# Patient Record
Sex: Female | Born: 1940
Health system: Southern US, Community
[De-identification: ages and names within clinical notes are randomized; demographics above are authoritative.]

## PROBLEM LIST (undated history)

## (undated) DIAGNOSIS — D689 Coagulation defect, unspecified: Secondary | ICD-10-CM

## (undated) DIAGNOSIS — M545 Low back pain, unspecified: Secondary | ICD-10-CM

## (undated) DIAGNOSIS — R011 Cardiac murmur, unspecified: Secondary | ICD-10-CM

## (undated) DIAGNOSIS — R569 Unspecified convulsions: Secondary | ICD-10-CM

## (undated) DIAGNOSIS — I2510 Atherosclerotic heart disease of native coronary artery without angina pectoris: Secondary | ICD-10-CM

## (undated) DIAGNOSIS — M1990 Unspecified osteoarthritis, unspecified site: Secondary | ICD-10-CM

## (undated) DIAGNOSIS — G8929 Other chronic pain: Secondary | ICD-10-CM

## (undated) DIAGNOSIS — M1711 Unilateral primary osteoarthritis, right knee: Secondary | ICD-10-CM

## (undated) DIAGNOSIS — Z5189 Encounter for other specified aftercare: Secondary | ICD-10-CM

## (undated) DIAGNOSIS — T8859XA Other complications of anesthesia, initial encounter: Secondary | ICD-10-CM

## (undated) DIAGNOSIS — Z9989 Dependence on other enabling machines and devices: Secondary | ICD-10-CM

## (undated) DIAGNOSIS — I83893 Varicose veins of bilateral lower extremities with other complications: Secondary | ICD-10-CM

## (undated) DIAGNOSIS — T148XXA Other injury of unspecified body region, initial encounter: Secondary | ICD-10-CM

## (undated) DIAGNOSIS — K219 Gastro-esophageal reflux disease without esophagitis: Secondary | ICD-10-CM

## (undated) DIAGNOSIS — M1712 Unilateral primary osteoarthritis, left knee: Secondary | ICD-10-CM

## (undated) DIAGNOSIS — R0609 Other forms of dyspnea: Secondary | ICD-10-CM

## (undated) DIAGNOSIS — T4145XA Adverse effect of unspecified anesthetic, initial encounter: Secondary | ICD-10-CM

## (undated) DIAGNOSIS — R0600 Dyspnea, unspecified: Secondary | ICD-10-CM

## (undated) DIAGNOSIS — Z86718 Personal history of other venous thrombosis and embolism: Secondary | ICD-10-CM

## (undated) DIAGNOSIS — M797 Fibromyalgia: Secondary | ICD-10-CM

## (undated) DIAGNOSIS — C50919 Malignant neoplasm of unspecified site of unspecified female breast: Secondary | ICD-10-CM

## (undated) DIAGNOSIS — J302 Other seasonal allergic rhinitis: Secondary | ICD-10-CM

## (undated) DIAGNOSIS — F419 Anxiety disorder, unspecified: Secondary | ICD-10-CM

## (undated) DIAGNOSIS — I2699 Other pulmonary embolism without acute cor pulmonale: Secondary | ICD-10-CM

## (undated) DIAGNOSIS — I890 Lymphedema, not elsewhere classified: Secondary | ICD-10-CM

## (undated) DIAGNOSIS — M109 Gout, unspecified: Secondary | ICD-10-CM

## (undated) DIAGNOSIS — I10 Essential (primary) hypertension: Secondary | ICD-10-CM

## (undated) DIAGNOSIS — G4733 Obstructive sleep apnea (adult) (pediatric): Secondary | ICD-10-CM

## (undated) DIAGNOSIS — M51379 Other intervertebral disc degeneration, lumbosacral region without mention of lumbar back pain or lower extremity pain: Secondary | ICD-10-CM

## (undated) DIAGNOSIS — M5137 Other intervertebral disc degeneration, lumbosacral region: Secondary | ICD-10-CM

## (undated) DIAGNOSIS — R609 Edema, unspecified: Secondary | ICD-10-CM

## (undated) DIAGNOSIS — Z8719 Personal history of other diseases of the digestive system: Secondary | ICD-10-CM

## (undated) HISTORY — DX: Gastro-esophageal reflux disease without esophagitis: K21.9

## (undated) HISTORY — DX: Atherosclerotic heart disease of native coronary artery without angina pectoris: I25.10

## (undated) HISTORY — DX: Edema, unspecified: R60.9

## (undated) HISTORY — PX: KNEE ARTHROSCOPY: SHX127

## (undated) HISTORY — PX: TUBAL LIGATION: SHX77

## (undated) HISTORY — DX: Unilateral primary osteoarthritis, right knee: M17.11

## (undated) HISTORY — PX: PORTACATH PLACEMENT: SHX2246

## (undated) HISTORY — DX: Encounter for other specified aftercare: Z51.89

## (undated) HISTORY — DX: Coagulation defect, unspecified: D68.9

## (undated) HISTORY — PX: CATARACT EXTRACTION, BILATERAL: SHX1313

## (undated) HISTORY — PX: BREAST SURGERY: SHX581

## (undated) HISTORY — PX: VAGINAL HYSTERECTOMY: SUR661

## (undated) HISTORY — DX: Unilateral primary osteoarthritis, left knee: M17.12

---
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
## 2001-05-10 ENCOUNTER — Encounter: Payer: Self-pay | Admitting: General Surgery

## 2001-05-10 ENCOUNTER — Encounter: Admission: RE | Admit: 2001-05-10 | Discharge: 2001-05-10 | Payer: Self-pay | Admitting: General Surgery

## 2007-05-11 ENCOUNTER — Encounter: Admission: RE | Admit: 2007-05-11 | Discharge: 2007-05-11 | Payer: Self-pay | Admitting: General Surgery

## 2008-02-18 HISTORY — PX: BREAST LUMPECTOMY: SHX2

## 2008-03-05 ENCOUNTER — Encounter: Admission: RE | Admit: 2008-03-05 | Discharge: 2008-03-05 | Payer: Self-pay | Admitting: Internal Medicine

## 2008-03-05 HISTORY — PX: BREAST BIOPSY: SHX20

## 2008-03-12 ENCOUNTER — Encounter: Admission: RE | Admit: 2008-03-12 | Discharge: 2008-03-12 | Payer: Self-pay | Admitting: General Surgery

## 2008-03-15 ENCOUNTER — Ambulatory Visit (HOSPITAL_BASED_OUTPATIENT_CLINIC_OR_DEPARTMENT_OTHER): Admission: RE | Admit: 2008-03-15 | Discharge: 2008-03-16 | Payer: Self-pay | Admitting: General Surgery

## 2008-03-15 ENCOUNTER — Encounter (HOSPITAL_BASED_OUTPATIENT_CLINIC_OR_DEPARTMENT_OTHER): Payer: Self-pay | Admitting: General Surgery

## 2008-03-28 ENCOUNTER — Ambulatory Visit (HOSPITAL_COMMUNITY): Admission: RE | Admit: 2008-03-28 | Discharge: 2008-03-29 | Payer: Self-pay | Admitting: General Surgery

## 2008-03-28 ENCOUNTER — Encounter (HOSPITAL_BASED_OUTPATIENT_CLINIC_OR_DEPARTMENT_OTHER): Payer: Self-pay | Admitting: General Surgery

## 2008-03-28 HISTORY — PX: MASTECTOMY MODIFIED RADICAL / SIMPLE / COMPLETE: SUR849

## 2008-04-06 ENCOUNTER — Ambulatory Visit: Payer: Self-pay | Admitting: Oncology

## 2008-04-18 LAB — CBC WITH DIFFERENTIAL/PLATELET
Basophils Absolute: 0.1 10*3/uL (ref 0.0–0.1)
HCT: 38.1 % (ref 34.8–46.6)
HGB: 13.1 g/dL (ref 11.6–15.9)
MCH: 33.4 pg (ref 26.0–34.0)
MONO#: 0.7 10*3/uL (ref 0.1–0.9)
NEUT%: 63 % (ref 39.6–76.8)
WBC: 6.9 10*3/uL (ref 3.9–10.0)
lymph#: 1.5 10*3/uL (ref 0.9–3.3)

## 2008-04-19 LAB — COMPREHENSIVE METABOLIC PANEL
BUN: 22 mg/dL (ref 6–23)
CO2: 24 mEq/L (ref 19–32)
Calcium: 9.6 mg/dL (ref 8.4–10.5)
Chloride: 103 mEq/L (ref 96–112)
Creatinine, Ser: 0.93 mg/dL (ref 0.40–1.20)
Glucose, Bld: 93 mg/dL (ref 70–99)

## 2008-04-19 LAB — VITAMIN D 25 HYDROXY (VIT D DEFICIENCY, FRACTURES): Vit D, 25-Hydroxy: 17 ng/mL — ABNORMAL LOW (ref 30–89)

## 2008-04-19 LAB — LACTATE DEHYDROGENASE: LDH: 129 U/L (ref 94–250)

## 2008-04-24 ENCOUNTER — Encounter (HOSPITAL_COMMUNITY): Admission: RE | Admit: 2008-04-24 | Discharge: 2008-07-19 | Payer: Self-pay | Admitting: Oncology

## 2008-04-30 ENCOUNTER — Encounter: Payer: Self-pay | Admitting: Oncology

## 2008-04-30 ENCOUNTER — Ambulatory Visit: Payer: Self-pay

## 2008-05-11 ENCOUNTER — Ambulatory Visit (HOSPITAL_COMMUNITY): Admission: RE | Admit: 2008-05-11 | Discharge: 2008-05-11 | Payer: Self-pay | Admitting: Oncology

## 2008-05-17 LAB — COMPREHENSIVE METABOLIC PANEL
ALT: 37 U/L — ABNORMAL HIGH (ref 0–35)
AST: 27 U/L (ref 0–37)
Chloride: 97 mEq/L (ref 96–112)
Creatinine, Ser: 1.11 mg/dL (ref 0.40–1.20)
Sodium: 136 mEq/L (ref 135–145)
Total Bilirubin: 0.8 mg/dL (ref 0.3–1.2)
Total Protein: 6.6 g/dL (ref 6.0–8.3)

## 2008-05-17 LAB — CBC WITH DIFFERENTIAL/PLATELET
BASO%: 2.9 % — ABNORMAL HIGH (ref 0.0–2.0)
EOS%: 1.9 % (ref 0.0–7.0)
HCT: 42.3 % (ref 34.8–46.6)
LYMPH%: 22.7 % (ref 14.0–48.0)
MCH: 32.2 pg (ref 26.0–34.0)
MCHC: 35.1 g/dL (ref 32.0–36.0)
MONO#: 1.1 10*3/uL — ABNORMAL HIGH (ref 0.1–0.9)
NEUT%: 56.6 % (ref 39.6–76.8)
RBC: 4.61 10*6/uL (ref 3.70–5.32)
WBC: 6.6 10*3/uL (ref 3.9–10.0)
lymph#: 1.5 10*3/uL (ref 0.9–3.3)

## 2008-05-21 ENCOUNTER — Ambulatory Visit (HOSPITAL_COMMUNITY): Admission: RE | Admit: 2008-05-21 | Discharge: 2008-05-21 | Payer: Self-pay | Admitting: General Surgery

## 2008-05-22 ENCOUNTER — Ambulatory Visit: Payer: Self-pay | Admitting: Oncology

## 2008-05-24 LAB — CBC WITH DIFFERENTIAL/PLATELET
BASO%: 0.7 % (ref 0.0–2.0)
EOS%: 2 % (ref 0.0–7.0)
HCT: 36.7 % (ref 34.8–46.6)
LYMPH%: 16.6 % (ref 14.0–48.0)
MCH: 32.5 pg (ref 26.0–34.0)
MCHC: 34.8 g/dL (ref 32.0–36.0)
MONO#: 0.6 10*3/uL (ref 0.1–0.9)
NEUT%: 74.4 % (ref 39.6–76.8)
Platelets: 168 10*3/uL (ref 145–400)
RBC: 3.94 10*6/uL (ref 3.70–5.32)
WBC: 8.7 10*3/uL (ref 3.9–10.0)
lymph#: 1.4 10*3/uL (ref 0.9–3.3)

## 2008-05-24 LAB — COMPREHENSIVE METABOLIC PANEL
ALT: 31 U/L (ref 0–35)
AST: 29 U/L (ref 0–37)
CO2: 26 mEq/L (ref 19–32)
Creatinine, Ser: 0.77 mg/dL (ref 0.40–1.20)
Sodium: 140 mEq/L (ref 135–145)
Total Bilirubin: 0.9 mg/dL (ref 0.3–1.2)
Total Protein: 5.7 g/dL — ABNORMAL LOW (ref 6.0–8.3)

## 2008-05-31 LAB — CBC WITH DIFFERENTIAL/PLATELET
BASO%: 0.1 % (ref 0.0–2.0)
EOS%: 0.1 % (ref 0.0–7.0)
HCT: 34.4 % — ABNORMAL LOW (ref 34.8–46.6)
LYMPH%: 4.2 % — ABNORMAL LOW (ref 14.0–48.0)
MCH: 32.8 pg (ref 26.0–34.0)
MCHC: 35.4 g/dL (ref 32.0–36.0)
MCV: 92.5 fL (ref 81.0–101.0)
MONO#: 0.3 10*3/uL (ref 0.1–0.9)
MONO%: 1.8 % (ref 0.0–13.0)
NEUT%: 93.7 % — ABNORMAL HIGH (ref 39.6–76.8)
Platelets: 402 10*3/uL — ABNORMAL HIGH (ref 145–400)
RBC: 3.72 10*6/uL (ref 3.70–5.32)

## 2008-05-31 LAB — COMPREHENSIVE METABOLIC PANEL
Alkaline Phosphatase: 71 U/L (ref 39–117)
Creatinine, Ser: 0.85 mg/dL (ref 0.40–1.20)
Glucose, Bld: 127 mg/dL — ABNORMAL HIGH (ref 70–99)
Sodium: 138 mEq/L (ref 135–145)
Total Bilirubin: 0.2 mg/dL — ABNORMAL LOW (ref 0.3–1.2)
Total Protein: 6.9 g/dL (ref 6.0–8.3)

## 2008-06-07 LAB — CBC WITH DIFFERENTIAL/PLATELET
Basophils Absolute: 0.2 10*3/uL — ABNORMAL HIGH (ref 0.0–0.1)
Eosinophils Absolute: 0.1 10*3/uL (ref 0.0–0.5)
HCT: 38 % (ref 34.8–46.6)
HGB: 13.3 g/dL (ref 11.6–15.9)
LYMPH%: 18 % (ref 14.0–48.0)
MCV: 94.5 fL (ref 81.0–101.0)
MONO#: 1.3 10*3/uL — ABNORMAL HIGH (ref 0.1–0.9)
MONO%: 14.8 % — ABNORMAL HIGH (ref 0.0–13.0)
NEUT#: 5.8 10*3/uL (ref 1.5–6.5)
NEUT%: 64.2 % (ref 39.6–76.8)
Platelets: 200 10*3/uL (ref 145–400)
RBC: 4.02 10*6/uL (ref 3.70–5.32)
WBC: 9 10*3/uL (ref 3.9–10.0)

## 2008-06-15 LAB — CBC WITH DIFFERENTIAL/PLATELET
BASO%: 1.1 % (ref 0.0–2.0)
HCT: 33.3 % — ABNORMAL LOW (ref 34.8–46.6)
LYMPH%: 17.8 % (ref 14.0–48.0)
MCH: 33.1 pg (ref 26.0–34.0)
MCHC: 35.1 g/dL (ref 32.0–36.0)
MCV: 94.2 fL (ref 81.0–101.0)
MONO#: 0.8 10*3/uL (ref 0.1–0.9)
MONO%: 10.2 % (ref 0.0–13.0)
NEUT%: 70 % (ref 39.6–76.8)
Platelets: 114 10*3/uL — ABNORMAL LOW (ref 145–400)
RBC: 3.54 10*6/uL — ABNORMAL LOW (ref 3.70–5.32)

## 2008-06-21 LAB — CBC WITH DIFFERENTIAL/PLATELET
Basophils Absolute: 0 10*3/uL (ref 0.0–0.1)
Eosinophils Absolute: 0 10*3/uL (ref 0.0–0.5)
HGB: 11.1 g/dL — ABNORMAL LOW (ref 11.6–15.9)
MCV: 94.6 fL (ref 81.0–101.0)
MONO#: 0.5 10*3/uL (ref 0.1–0.9)
MONO%: 3.4 % (ref 0.0–13.0)
NEUT#: 13.1 10*3/uL — ABNORMAL HIGH (ref 1.5–6.5)
RBC: 3.28 10*6/uL — ABNORMAL LOW (ref 3.70–5.32)
RDW: 16.5 % — ABNORMAL HIGH (ref 11.3–14.5)
WBC: 14.4 10*3/uL — ABNORMAL HIGH (ref 3.9–10.0)
lymph#: 0.7 10*3/uL — ABNORMAL LOW (ref 0.9–3.3)

## 2008-06-21 LAB — COMPREHENSIVE METABOLIC PANEL
Albumin: 3.9 g/dL (ref 3.5–5.2)
Alkaline Phosphatase: 61 U/L (ref 39–117)
BUN: 26 mg/dL — ABNORMAL HIGH (ref 6–23)
CO2: 20 mEq/L (ref 19–32)
Glucose, Bld: 138 mg/dL — ABNORMAL HIGH (ref 70–99)
Potassium: 3.4 mEq/L — ABNORMAL LOW (ref 3.5–5.3)
Total Protein: 6.3 g/dL (ref 6.0–8.3)

## 2008-06-28 LAB — COMPREHENSIVE METABOLIC PANEL
Albumin: 4 g/dL (ref 3.5–5.2)
Alkaline Phosphatase: 80 U/L (ref 39–117)
BUN: 20 mg/dL (ref 6–23)
CO2: 19 mEq/L (ref 19–32)
Calcium: 8.5 mg/dL (ref 8.4–10.5)
Chloride: 105 mEq/L (ref 96–112)
Glucose, Bld: 135 mg/dL — ABNORMAL HIGH (ref 70–99)
Potassium: 3.9 mEq/L (ref 3.5–5.3)
Sodium: 139 mEq/L (ref 135–145)
Total Protein: 6.5 g/dL (ref 6.0–8.3)

## 2008-06-28 LAB — CBC WITH DIFFERENTIAL/PLATELET
Basophils Absolute: 0.1 10*3/uL (ref 0.0–0.1)
Eosinophils Absolute: 0 10*3/uL (ref 0.0–0.5)
HGB: 12.5 g/dL (ref 11.6–15.9)
MCV: 96.5 fL (ref 81.0–101.0)
MONO%: 27.2 % — ABNORMAL HIGH (ref 0.0–13.0)
NEUT#: 2.8 10*3/uL (ref 1.5–6.5)
RBC: 3.66 10*6/uL — ABNORMAL LOW (ref 3.70–5.32)
RDW: 16.3 % — ABNORMAL HIGH (ref 11.3–14.5)
WBC: 5.7 10*3/uL (ref 3.9–10.0)
lymph#: 1.2 10*3/uL (ref 0.9–3.3)

## 2008-07-05 LAB — CBC WITH DIFFERENTIAL/PLATELET
Basophils Absolute: 0.1 10*3/uL (ref 0.0–0.1)
Eosinophils Absolute: 0.1 10*3/uL (ref 0.0–0.5)
HGB: 10 g/dL — ABNORMAL LOW (ref 11.6–15.9)
MONO#: 0.6 10*3/uL (ref 0.1–0.9)
NEUT#: 5.1 10*3/uL (ref 1.5–6.5)
Platelets: 84 10*3/uL — ABNORMAL LOW (ref 145–400)
RBC: 2.94 10*6/uL — ABNORMAL LOW (ref 3.70–5.32)
RDW: 16.5 % — ABNORMAL HIGH (ref 11.3–14.5)
WBC: 7.1 10*3/uL (ref 3.9–10.0)

## 2008-07-09 ENCOUNTER — Ambulatory Visit: Payer: Self-pay | Admitting: Oncology

## 2008-07-11 LAB — CBC WITH DIFFERENTIAL/PLATELET
BASO%: 1.3 % (ref 0.0–2.0)
EOS%: 5.2 % (ref 0.0–7.0)
LYMPH%: 22.4 % (ref 14.0–48.0)
MCH: 34.5 pg — ABNORMAL HIGH (ref 26.0–34.0)
MCHC: 34.3 g/dL (ref 32.0–36.0)
MCV: 101 fL (ref 81.0–101.0)
MONO#: 0.7 10*3/uL (ref 0.1–0.9)
MONO%: 12.8 % (ref 0.0–13.0)
Platelets: 212 10*3/uL (ref 145–400)
RBC: 2.87 10*6/uL — ABNORMAL LOW (ref 3.70–5.32)
WBC: 5.2 10*3/uL (ref 3.9–10.0)

## 2008-07-19 LAB — COMPREHENSIVE METABOLIC PANEL
ALT: 31 U/L (ref 0–35)
AST: 44 U/L — ABNORMAL HIGH (ref 0–37)
Alkaline Phosphatase: 58 U/L (ref 39–117)
Creatinine, Ser: 1 mg/dL (ref 0.40–1.20)
Sodium: 136 mEq/L (ref 135–145)
Total Bilirubin: 0.3 mg/dL (ref 0.3–1.2)
Total Protein: 6.3 g/dL (ref 6.0–8.3)

## 2008-07-19 LAB — CBC WITH DIFFERENTIAL/PLATELET
BASO%: 0.1 % (ref 0.0–2.0)
HCT: 30 % — ABNORMAL LOW (ref 34.8–46.6)
LYMPH%: 4.2 % — ABNORMAL LOW (ref 14.0–48.0)
MCHC: 33.8 g/dL (ref 32.0–36.0)
MCV: 106.3 fL — ABNORMAL HIGH (ref 81.0–101.0)
MONO%: 0.9 % (ref 0.0–13.0)
NEUT%: 94.8 % — ABNORMAL HIGH (ref 39.6–76.8)
Platelets: 387 10*3/uL (ref 145–400)
RBC: 2.82 10*6/uL — ABNORMAL LOW (ref 3.70–5.32)

## 2008-07-20 DIAGNOSIS — IMO0001 Reserved for inherently not codable concepts without codable children: Secondary | ICD-10-CM

## 2008-07-20 DIAGNOSIS — Z5189 Encounter for other specified aftercare: Secondary | ICD-10-CM

## 2008-07-20 HISTORY — DX: Reserved for inherently not codable concepts without codable children: IMO0001

## 2008-07-20 HISTORY — DX: Encounter for other specified aftercare: Z51.89

## 2008-07-26 LAB — CBC WITH DIFFERENTIAL/PLATELET
Basophils Absolute: 0 10*3/uL (ref 0.0–0.1)
Eosinophils Absolute: 0.1 10*3/uL (ref 0.0–0.5)
HGB: 11.8 g/dL (ref 11.6–15.9)
MCV: 105.3 fL — ABNORMAL HIGH (ref 81.0–101.0)
MONO#: 1.7 10*3/uL — ABNORMAL HIGH (ref 0.1–0.9)
NEUT#: 2.8 10*3/uL (ref 1.5–6.5)
RDW: 20.8 % — ABNORMAL HIGH (ref 11.3–14.5)
WBC: 5.9 10*3/uL (ref 3.9–10.0)
lymph#: 1.4 10*3/uL (ref 0.9–3.3)

## 2008-08-02 LAB — BASIC METABOLIC PANEL
BUN: 18 mg/dL (ref 6–23)
Chloride: 100 mEq/L (ref 96–112)
Creatinine, Ser: 0.92 mg/dL (ref 0.40–1.20)

## 2008-08-02 LAB — CBC WITH DIFFERENTIAL/PLATELET
Basophils Absolute: 0.1 10*3/uL (ref 0.0–0.1)
EOS%: 0.5 % (ref 0.0–7.0)
HCT: 28.5 % — ABNORMAL LOW (ref 34.8–46.6)
HGB: 9.7 g/dL — ABNORMAL LOW (ref 11.6–15.9)
MCH: 36.8 pg — ABNORMAL HIGH (ref 26.0–34.0)
MCV: 107.8 fL — ABNORMAL HIGH (ref 81.0–101.0)
MONO%: 8.3 % (ref 0.0–13.0)
NEUT%: 74.8 % (ref 39.6–76.8)
Platelets: 116 10*3/uL — ABNORMAL LOW (ref 145–400)

## 2008-08-09 LAB — COMPREHENSIVE METABOLIC PANEL
ALT: 19 U/L (ref 0–35)
AST: 19 U/L (ref 0–37)
Albumin: 4 g/dL (ref 3.5–5.2)
Alkaline Phosphatase: 70 U/L (ref 39–117)
Glucose, Bld: 175 mg/dL — ABNORMAL HIGH (ref 70–99)
Potassium: 4.6 mEq/L (ref 3.5–5.3)
Sodium: 139 mEq/L (ref 135–145)
Total Protein: 6.5 g/dL (ref 6.0–8.3)

## 2008-08-09 LAB — CBC WITH DIFFERENTIAL/PLATELET
BASO%: 0.5 % (ref 0.0–2.0)
EOS%: 0 % (ref 0.0–7.0)
Eosinophils Absolute: 0 10*3/uL (ref 0.0–0.5)
MCV: 110.8 fL — ABNORMAL HIGH (ref 81.0–101.0)
MONO%: 1.5 % (ref 0.0–13.0)
NEUT#: 13.2 10*3/uL — ABNORMAL HIGH (ref 1.5–6.5)
RBC: 2.77 10*6/uL — ABNORMAL LOW (ref 3.70–5.32)
RDW: 21.4 % — ABNORMAL HIGH (ref 11.3–14.5)

## 2008-08-16 LAB — BASIC METABOLIC PANEL
Calcium: 8.7 mg/dL (ref 8.4–10.5)
Potassium: 3.7 mEq/L (ref 3.5–5.3)
Sodium: 132 mEq/L — ABNORMAL LOW (ref 135–145)

## 2008-08-16 LAB — CBC WITH DIFFERENTIAL/PLATELET
EOS%: 0.4 % (ref 0.0–7.0)
LYMPH%: 20.6 % (ref 14.0–48.0)
MCH: 38.4 pg — ABNORMAL HIGH (ref 26.0–34.0)
MCHC: 34.5 g/dL (ref 32.0–36.0)
MCV: 111.4 fL — ABNORMAL HIGH (ref 81.0–101.0)
MONO%: 11.5 % (ref 0.0–13.0)
RBC: 2.99 10*6/uL — ABNORMAL LOW (ref 3.70–5.32)
RDW: 17.5 % — ABNORMAL HIGH (ref 11.3–14.5)

## 2008-08-21 ENCOUNTER — Ambulatory Visit: Payer: Self-pay | Admitting: Oncology

## 2008-08-23 LAB — CBC WITH DIFFERENTIAL/PLATELET
BASO%: 0.7 % (ref 0.0–2.0)
LYMPH%: 17.7 % (ref 14.0–48.0)
MCHC: 34.9 g/dL (ref 32.0–36.0)
MCV: 112.7 fL — ABNORMAL HIGH (ref 81.0–101.0)
MONO%: 5.2 % (ref 0.0–13.0)
Platelets: 61 10*3/uL — ABNORMAL LOW (ref 145–400)
RBC: 2.56 10*6/uL — ABNORMAL LOW (ref 3.70–5.32)

## 2008-08-29 ENCOUNTER — Ambulatory Visit: Admission: RE | Admit: 2008-08-29 | Discharge: 2008-08-29 | Payer: Self-pay | Admitting: Oncology

## 2008-08-29 ENCOUNTER — Ambulatory Visit: Payer: Self-pay | Admitting: Cardiology

## 2008-08-29 ENCOUNTER — Encounter: Payer: Self-pay | Admitting: Oncology

## 2008-08-30 ENCOUNTER — Encounter (HOSPITAL_COMMUNITY): Admission: RE | Admit: 2008-08-30 | Discharge: 2008-11-22 | Payer: Self-pay | Admitting: Oncology

## 2008-09-06 LAB — CBC WITH DIFFERENTIAL/PLATELET
BASO%: 0.2 % (ref 0.0–2.0)
EOS%: 0 % (ref 0.0–7.0)
LYMPH%: 5.1 % — ABNORMAL LOW (ref 14.0–48.0)
MCHC: 34.3 g/dL (ref 32.0–36.0)
MONO#: 0.1 10*3/uL (ref 0.1–0.9)
Platelets: 159 10*3/uL (ref 145–400)
RBC: 3.17 10*6/uL — ABNORMAL LOW (ref 3.70–5.32)
WBC: 7.6 10*3/uL (ref 3.9–10.0)
lymph#: 0.4 10*3/uL — ABNORMAL LOW (ref 0.9–3.3)

## 2008-09-13 LAB — CBC WITH DIFFERENTIAL/PLATELET
BASO%: 0.5 % (ref 0.0–2.0)
HCT: 37.5 % (ref 34.8–46.6)
MCHC: 35.1 g/dL (ref 31.5–36.0)
MONO#: 0.5 10*3/uL (ref 0.1–0.9)
NEUT%: 62.3 % (ref 38.4–76.8)
RDW: 19 % — ABNORMAL HIGH (ref 11.2–14.5)
WBC: 3.8 10*3/uL — ABNORMAL LOW (ref 3.9–10.3)
lymph#: 0.9 10*3/uL (ref 0.9–3.3)
nRBC: 0 % (ref 0–0)

## 2008-10-04 LAB — CBC WITH DIFFERENTIAL/PLATELET
Basophils Absolute: 0.1 10*3/uL (ref 0.0–0.1)
EOS%: 3.5 % (ref 0.0–7.0)
HCT: 31.1 % — ABNORMAL LOW (ref 34.8–46.6)
HGB: 10.3 g/dL — ABNORMAL LOW (ref 11.6–15.9)
MCH: 36.1 pg — ABNORMAL HIGH (ref 25.1–34.0)
MCV: 109.1 fL — ABNORMAL HIGH (ref 79.5–101.0)
MONO%: 10.7 % (ref 0.0–14.0)
NEUT%: 69.5 % (ref 38.4–76.8)

## 2008-10-04 LAB — COMPREHENSIVE METABOLIC PANEL
ALT: 31 U/L (ref 0–35)
CO2: 21 mEq/L (ref 19–32)
Creatinine, Ser: 1.13 mg/dL (ref 0.40–1.20)
Glucose, Bld: 108 mg/dL — ABNORMAL HIGH (ref 70–99)
Total Bilirubin: 0.3 mg/dL (ref 0.3–1.2)

## 2008-10-23 ENCOUNTER — Ambulatory Visit: Payer: Self-pay | Admitting: Oncology

## 2008-10-25 LAB — COMPREHENSIVE METABOLIC PANEL
ALT: 25 U/L (ref 0–35)
AST: 27 U/L (ref 0–37)
Albumin: 4.2 g/dL (ref 3.5–5.2)
CO2: 22 mEq/L (ref 19–32)
Calcium: 9.1 mg/dL (ref 8.4–10.5)
Chloride: 106 mEq/L (ref 96–112)
Potassium: 4.1 mEq/L (ref 3.5–5.3)
Total Protein: 6.8 g/dL (ref 6.0–8.3)

## 2008-10-25 LAB — CBC WITH DIFFERENTIAL/PLATELET
BASO%: 0.7 % (ref 0.0–2.0)
EOS%: 13.8 % — ABNORMAL HIGH (ref 0.0–7.0)
HCT: 34 % — ABNORMAL LOW (ref 34.8–46.6)
HGB: 11.7 g/dL (ref 11.6–15.9)
MCH: 36.8 pg — ABNORMAL HIGH (ref 25.1–34.0)
MCHC: 34.4 g/dL (ref 31.5–36.0)
MONO#: 0.5 10*3/uL (ref 0.1–0.9)
RDW: 15.6 % — ABNORMAL HIGH (ref 11.2–14.5)
WBC: 6.7 10*3/uL (ref 3.9–10.3)
lymph#: 1.4 10*3/uL (ref 0.9–3.3)

## 2008-10-30 ENCOUNTER — Ambulatory Visit (HOSPITAL_BASED_OUTPATIENT_CLINIC_OR_DEPARTMENT_OTHER): Admission: RE | Admit: 2008-10-30 | Discharge: 2008-10-30 | Payer: Self-pay | Admitting: General Surgery

## 2008-11-15 LAB — COMPREHENSIVE METABOLIC PANEL
ALT: 53 U/L — ABNORMAL HIGH (ref 0–35)
AST: 45 U/L — ABNORMAL HIGH (ref 0–37)
Calcium: 8.5 mg/dL (ref 8.4–10.5)
Chloride: 103 mEq/L (ref 96–112)
Creatinine, Ser: 1.08 mg/dL (ref 0.40–1.20)
Potassium: 3.6 mEq/L (ref 3.5–5.3)
Sodium: 138 mEq/L (ref 135–145)
Total Protein: 6.6 g/dL (ref 6.0–8.3)

## 2008-11-15 LAB — CBC WITH DIFFERENTIAL/PLATELET
BASO%: 0.6 % (ref 0.0–2.0)
EOS%: 3.8 % (ref 0.0–7.0)
MCH: 35.6 pg — ABNORMAL HIGH (ref 25.1–34.0)
MCHC: 34.1 g/dL (ref 31.5–36.0)
NEUT%: 71.4 % (ref 38.4–76.8)
RBC: 3.17 10*6/uL — ABNORMAL LOW (ref 3.70–5.45)
RDW: 13.3 % (ref 11.2–14.5)
WBC: 8.1 10*3/uL (ref 3.9–10.3)
lymph#: 1 10*3/uL (ref 0.9–3.3)

## 2008-11-27 ENCOUNTER — Encounter: Payer: Self-pay | Admitting: Oncology

## 2008-11-27 ENCOUNTER — Ambulatory Visit: Admission: RE | Admit: 2008-11-27 | Discharge: 2008-11-27 | Payer: Self-pay | Admitting: Oncology

## 2008-12-04 ENCOUNTER — Ambulatory Visit: Payer: Self-pay | Admitting: Oncology

## 2008-12-06 LAB — COMPREHENSIVE METABOLIC PANEL
ALT: 39 U/L — ABNORMAL HIGH (ref 0–35)
AST: 28 U/L (ref 0–37)
Albumin: 3.9 g/dL (ref 3.5–5.2)
BUN: 25 mg/dL — ABNORMAL HIGH (ref 6–23)
CO2: 23 mEq/L (ref 19–32)
Calcium: 9.3 mg/dL (ref 8.4–10.5)
Chloride: 107 mEq/L (ref 96–112)
Creatinine, Ser: 1.02 mg/dL (ref 0.40–1.20)
Potassium: 3.9 mEq/L (ref 3.5–5.3)

## 2008-12-06 LAB — CBC WITH DIFFERENTIAL/PLATELET
BASO%: 0.5 % (ref 0.0–2.0)
Basophils Absolute: 0 10*3/uL (ref 0.0–0.1)
Eosinophils Absolute: 0.2 10*3/uL (ref 0.0–0.5)
HCT: 38.7 % (ref 34.8–46.6)
HGB: 12.9 g/dL (ref 11.6–15.9)
LYMPH%: 21 % (ref 14.0–49.7)
MCHC: 33.3 g/dL (ref 31.5–36.0)
MONO#: 0.4 10*3/uL (ref 0.1–0.9)
NEUT#: 4.4 10*3/uL (ref 1.5–6.5)
NEUT%: 68.7 % (ref 38.4–76.8)
Platelets: 185 10*3/uL (ref 145–400)
WBC: 6.4 10*3/uL (ref 3.9–10.3)
lymph#: 1.3 10*3/uL (ref 0.9–3.3)

## 2008-12-27 LAB — COMPREHENSIVE METABOLIC PANEL
Alkaline Phosphatase: 60 U/L (ref 39–117)
BUN: 26 mg/dL — ABNORMAL HIGH (ref 6–23)
CO2: 21 mEq/L (ref 19–32)
Creatinine, Ser: 0.99 mg/dL (ref 0.40–1.20)
Glucose, Bld: 79 mg/dL (ref 70–99)
Sodium: 141 mEq/L (ref 135–145)
Total Bilirubin: 0.4 mg/dL (ref 0.3–1.2)
Total Protein: 6.7 g/dL (ref 6.0–8.3)

## 2008-12-27 LAB — CBC WITH DIFFERENTIAL/PLATELET
Basophils Absolute: 0 10*3/uL (ref 0.0–0.1)
EOS%: 3.6 % (ref 0.0–7.0)
Eosinophils Absolute: 0.2 10*3/uL (ref 0.0–0.5)
HGB: 12.7 g/dL (ref 11.6–15.9)
LYMPH%: 21.6 % (ref 14.0–49.7)
MCH: 34.2 pg — ABNORMAL HIGH (ref 25.1–34.0)
MCV: 100.8 fL (ref 79.5–101.0)
MONO%: 8.8 % (ref 0.0–14.0)
Platelets: 173 10*3/uL (ref 145–400)
RDW: 13.6 % (ref 11.2–14.5)

## 2008-12-27 LAB — CANCER ANTIGEN 27.29: CA 27.29: 27 U/mL (ref 0–39)

## 2009-01-15 ENCOUNTER — Ambulatory Visit: Payer: Self-pay | Admitting: Oncology

## 2009-01-17 LAB — CBC WITH DIFFERENTIAL/PLATELET
BASO%: 0.5 % (ref 0.0–2.0)
HCT: 36 % (ref 34.8–46.6)
LYMPH%: 22.7 % (ref 14.0–49.7)
MCH: 33.8 pg (ref 25.1–34.0)
MCHC: 34.2 g/dL (ref 31.5–36.0)
MONO#: 0.5 10*3/uL (ref 0.1–0.9)
NEUT%: 64.4 % (ref 38.4–76.8)
Platelets: 183 10*3/uL (ref 145–400)
WBC: 6.1 10*3/uL (ref 3.9–10.3)

## 2009-01-17 LAB — COMPREHENSIVE METABOLIC PANEL
ALT: 21 U/L (ref 0–35)
BUN: 26 mg/dL — ABNORMAL HIGH (ref 6–23)
CO2: 20 mEq/L (ref 19–32)
Creatinine, Ser: 1.01 mg/dL (ref 0.40–1.20)
Total Bilirubin: 0.3 mg/dL (ref 0.3–1.2)

## 2009-02-07 LAB — COMPREHENSIVE METABOLIC PANEL
Albumin: 4.1 g/dL (ref 3.5–5.2)
CO2: 22 mEq/L (ref 19–32)
Calcium: 9.4 mg/dL (ref 8.4–10.5)
Chloride: 105 mEq/L (ref 96–112)
Glucose, Bld: 107 mg/dL — ABNORMAL HIGH (ref 70–99)
Potassium: 4 mEq/L (ref 3.5–5.3)
Sodium: 141 mEq/L (ref 135–145)
Total Bilirubin: 0.4 mg/dL (ref 0.3–1.2)
Total Protein: 6.9 g/dL (ref 6.0–8.3)

## 2009-02-07 LAB — CBC WITH DIFFERENTIAL/PLATELET
BASO%: 0.9 % (ref 0.0–2.0)
EOS%: 6 % (ref 0.0–7.0)
Eosinophils Absolute: 0.4 10*3/uL (ref 0.0–0.5)
LYMPH%: 19.5 % (ref 14.0–49.7)
MCV: 99.5 fL (ref 79.5–101.0)
MONO#: 0.6 10*3/uL (ref 0.1–0.9)
NEUT#: 4.3 10*3/uL (ref 1.5–6.5)
RBC: 3.75 10*6/uL (ref 3.70–5.45)
RDW: 13.6 % (ref 11.2–14.5)
WBC: 6.7 10*3/uL (ref 3.9–10.3)
lymph#: 1.3 10*3/uL (ref 0.9–3.3)

## 2009-02-13 ENCOUNTER — Ambulatory Visit (HOSPITAL_BASED_OUTPATIENT_CLINIC_OR_DEPARTMENT_OTHER): Admission: RE | Admit: 2009-02-13 | Discharge: 2009-02-13 | Payer: Self-pay | Admitting: Orthopedic Surgery

## 2009-02-26 ENCOUNTER — Ambulatory Visit: Payer: Self-pay | Admitting: Oncology

## 2009-02-28 LAB — COMPREHENSIVE METABOLIC PANEL
CO2: 21 mEq/L (ref 19–32)
Creatinine, Ser: 1.07 mg/dL (ref 0.40–1.20)
Glucose, Bld: 105 mg/dL — ABNORMAL HIGH (ref 70–99)
Total Bilirubin: 0.4 mg/dL (ref 0.3–1.2)

## 2009-02-28 LAB — CBC WITH DIFFERENTIAL/PLATELET
Eosinophils Absolute: 0.4 10*3/uL (ref 0.0–0.5)
HCT: 38 % (ref 34.8–46.6)
HGB: 13 g/dL (ref 11.6–15.9)
LYMPH%: 20.2 % (ref 14.0–49.7)
MONO#: 0.5 10*3/uL (ref 0.1–0.9)
NEUT#: 4.4 10*3/uL (ref 1.5–6.5)
NEUT%: 65.5 % (ref 38.4–76.8)
Platelets: 175 10*3/uL (ref 145–400)
WBC: 6.6 10*3/uL (ref 3.9–10.3)

## 2009-03-05 ENCOUNTER — Ambulatory Visit: Admission: RE | Admit: 2009-03-05 | Discharge: 2009-03-05 | Payer: Self-pay | Admitting: Oncology

## 2009-03-05 ENCOUNTER — Encounter: Payer: Self-pay | Admitting: Oncology

## 2009-03-21 LAB — CBC WITH DIFFERENTIAL/PLATELET
Basophils Absolute: 0 10*3/uL (ref 0.0–0.1)
EOS%: 5.7 % (ref 0.0–7.0)
Eosinophils Absolute: 0.3 10*3/uL (ref 0.0–0.5)
HGB: 12.5 g/dL (ref 11.6–15.9)
MCH: 34.4 pg — ABNORMAL HIGH (ref 25.1–34.0)
MONO#: 0.4 10*3/uL (ref 0.1–0.9)
NEUT#: 3.9 10*3/uL (ref 1.5–6.5)
RDW: 13.8 % (ref 11.2–14.5)
WBC: 6 10*3/uL (ref 3.9–10.3)
lymph#: 1.4 10*3/uL (ref 0.9–3.3)

## 2009-03-22 LAB — COMPREHENSIVE METABOLIC PANEL
AST: 19 U/L (ref 0–37)
BUN: 24 mg/dL — ABNORMAL HIGH (ref 6–23)
Calcium: 9 mg/dL (ref 8.4–10.5)
Chloride: 106 mEq/L (ref 96–112)
Creatinine, Ser: 1.11 mg/dL (ref 0.40–1.20)
Total Bilirubin: 0.3 mg/dL (ref 0.3–1.2)

## 2009-03-22 LAB — VITAMIN D 25 HYDROXY (VIT D DEFICIENCY, FRACTURES): Vit D, 25-Hydroxy: 31 ng/mL (ref 30–89)

## 2009-03-22 LAB — LACTATE DEHYDROGENASE: LDH: 153 U/L (ref 94–250)

## 2009-03-22 LAB — CANCER ANTIGEN 27.29: CA 27.29: 26 U/mL (ref 0–39)

## 2009-04-09 ENCOUNTER — Ambulatory Visit: Payer: Self-pay | Admitting: Oncology

## 2009-04-11 ENCOUNTER — Encounter: Admission: RE | Admit: 2009-04-11 | Discharge: 2009-04-11 | Payer: Self-pay | Admitting: Oncology

## 2009-04-11 LAB — CBC WITH DIFFERENTIAL/PLATELET
BASO%: 0.5 % (ref 0.0–2.0)
Eosinophils Absolute: 0.3 10*3/uL (ref 0.0–0.5)
HCT: 39.6 % (ref 34.8–46.6)
LYMPH%: 24.2 % (ref 14.0–49.7)
MCHC: 34.3 g/dL (ref 31.5–36.0)
MONO#: 0.6 10*3/uL (ref 0.1–0.9)
NEUT%: 62.3 % (ref 38.4–76.8)
Platelets: 174 10*3/uL (ref 145–400)
WBC: 6.6 10*3/uL (ref 3.9–10.3)

## 2009-04-11 LAB — COMPREHENSIVE METABOLIC PANEL
ALT: 19 U/L (ref 0–35)
AST: 19 U/L (ref 0–37)
Albumin: 3.8 g/dL (ref 3.5–5.2)
BUN: 25 mg/dL — ABNORMAL HIGH (ref 6–23)
CO2: 24 mEq/L (ref 19–32)
Calcium: 9 mg/dL (ref 8.4–10.5)
Chloride: 105 mEq/L (ref 96–112)
Potassium: 3.8 mEq/L (ref 3.5–5.3)

## 2009-05-02 ENCOUNTER — Encounter (INDEPENDENT_AMBULATORY_CARE_PROVIDER_SITE_OTHER): Payer: Self-pay

## 2009-05-02 ENCOUNTER — Encounter: Payer: Self-pay | Admitting: Cardiology

## 2009-05-02 LAB — CBC WITH DIFFERENTIAL/PLATELET
Eosinophils Absolute: 0.2 10*3/uL (ref 0.0–0.5)
HCT: 38.6 % (ref 34.8–46.6)
LYMPH%: 20.7 % (ref 14.0–49.7)
MCHC: 34.5 g/dL (ref 31.5–36.0)
MCV: 102.9 fL — ABNORMAL HIGH (ref 79.5–101.0)
MONO#: 0.4 10*3/uL (ref 0.1–0.9)
MONO%: 7.4 % (ref 0.0–14.0)
NEUT#: 3.7 10*3/uL (ref 1.5–6.5)
NEUT%: 67.3 % (ref 38.4–76.8)
Platelets: 201 10*3/uL (ref 145–400)
WBC: 5.5 10*3/uL (ref 3.9–10.3)

## 2009-05-02 LAB — CONVERTED CEMR LAB
ALT: 22 U/L
AST: 21 U/L
Albumin: 4.5 g/dL
Alkaline Phosphatase: 69 U/L
BUN: 26 mg/dL
Bilirubin, Direct: 0.3 mg/dL
CO2: 20 meq/L
Calcium: 9.4 mg/dL
Chloride: 108 meq/L
Creatinine, Ser: 0.97 mg/dL
Glucose, Bld: 93 mg/dL
Potassium: 3.5 meq/L
Sodium: 142 meq/L
Total Protein: 7.1 g/dL

## 2009-05-02 LAB — COMPREHENSIVE METABOLIC PANEL
BUN: 26 mg/dL — ABNORMAL HIGH (ref 6–23)
CO2: 20 mEq/L (ref 19–32)
Calcium: 9.4 mg/dL (ref 8.4–10.5)
Chloride: 108 mEq/L (ref 96–112)
Creatinine, Ser: 0.97 mg/dL (ref 0.40–1.20)

## 2009-05-02 LAB — CANCER ANTIGEN 27.29: CA 27.29: 24 U/mL (ref 0–39)

## 2009-05-10 ENCOUNTER — Ambulatory Visit (HOSPITAL_COMMUNITY): Admission: RE | Admit: 2009-05-10 | Discharge: 2009-05-10 | Payer: Self-pay | Admitting: Oncology

## 2009-05-13 ENCOUNTER — Encounter (INDEPENDENT_AMBULATORY_CARE_PROVIDER_SITE_OTHER): Payer: Self-pay

## 2009-05-13 ENCOUNTER — Ambulatory Visit: Payer: Self-pay | Admitting: Cardiology

## 2009-05-13 DIAGNOSIS — I080 Rheumatic disorders of both mitral and aortic valves: Secondary | ICD-10-CM

## 2009-05-14 ENCOUNTER — Encounter: Payer: Self-pay | Admitting: Cardiology

## 2009-05-15 ENCOUNTER — Encounter: Payer: Self-pay | Admitting: Cardiology

## 2009-07-25 ENCOUNTER — Ambulatory Visit: Payer: Self-pay | Admitting: Oncology

## 2009-07-29 ENCOUNTER — Encounter: Payer: Self-pay | Admitting: Cardiology

## 2009-07-29 LAB — CBC WITH DIFFERENTIAL/PLATELET
Basophils Absolute: 0.1 10*3/uL (ref 0.0–0.1)
EOS%: 3.5 % (ref 0.0–7.0)
Eosinophils Absolute: 0.2 10*3/uL (ref 0.0–0.5)
HCT: 40.3 % (ref 34.8–46.6)
HGB: 13.9 g/dL (ref 11.6–15.9)
MONO#: 0.6 10*3/uL (ref 0.1–0.9)
NEUT#: 4.1 10*3/uL (ref 1.5–6.5)
NEUT%: 66.7 % (ref 38.4–76.8)
RBC: 3.88 10*6/uL (ref 3.70–5.45)
RDW: 13.7 % (ref 11.2–14.5)
WBC: 6.2 10*3/uL (ref 3.9–10.3)

## 2009-07-29 LAB — COMPREHENSIVE METABOLIC PANEL
ALT: 18 U/L (ref 0–35)
Alkaline Phosphatase: 70 U/L (ref 39–117)
BUN: 24 mg/dL — ABNORMAL HIGH (ref 6–23)
CO2: 25 mEq/L (ref 19–32)
Calcium: 9.6 mg/dL (ref 8.4–10.5)
Chloride: 98 mEq/L (ref 96–112)
Creatinine, Ser: 1.07 mg/dL (ref 0.40–1.20)
Potassium: 4.3 mEq/L (ref 3.5–5.3)
Total Bilirubin: 0.4 mg/dL (ref 0.3–1.2)

## 2009-07-29 LAB — CANCER ANTIGEN 27.29: CA 27.29: 18 U/mL (ref 0–39)

## 2009-10-17 ENCOUNTER — Encounter: Admission: RE | Admit: 2009-10-17 | Discharge: 2009-10-17 | Payer: Self-pay | Admitting: General Surgery

## 2010-01-23 ENCOUNTER — Ambulatory Visit: Payer: Self-pay | Admitting: Oncology

## 2010-01-27 ENCOUNTER — Encounter: Payer: Self-pay | Admitting: Cardiology

## 2010-01-27 LAB — COMPREHENSIVE METABOLIC PANEL
ALT: 27 U/L (ref 0–35)
AST: 21 U/L (ref 0–37)
Albumin: 4.3 g/dL (ref 3.5–5.2)
Alkaline Phosphatase: 72 U/L (ref 39–117)
CO2: 26 mEq/L (ref 19–32)
Chloride: 104 mEq/L (ref 96–112)
Glucose, Bld: 87 mg/dL (ref 70–99)
Potassium: 4 mEq/L (ref 3.5–5.3)
Sodium: 141 mEq/L (ref 135–145)
Total Bilirubin: 0.5 mg/dL (ref 0.3–1.2)
Total Protein: 7.1 g/dL (ref 6.0–8.3)

## 2010-01-27 LAB — CBC WITH DIFFERENTIAL/PLATELET
Basophils Absolute: 0 10*3/uL (ref 0.0–0.1)
Eosinophils Absolute: 0.1 10*3/uL (ref 0.0–0.5)
HGB: 14.3 g/dL (ref 11.6–15.9)
LYMPH%: 18 % (ref 14.0–49.7)
MCH: 35.1 pg — ABNORMAL HIGH (ref 25.1–34.0)
MONO#: 0.5 10*3/uL (ref 0.1–0.9)
NEUT%: 71.2 % (ref 38.4–76.8)
Platelets: 221 10*3/uL (ref 145–400)
RBC: 4.07 10*6/uL (ref 3.70–5.45)
WBC: 6.3 10*3/uL (ref 3.9–10.3)
lymph#: 1.1 10*3/uL (ref 0.9–3.3)

## 2010-01-27 LAB — CANCER ANTIGEN 27.29: CA 27.29: 21 U/mL (ref 0–39)

## 2010-08-01 ENCOUNTER — Ambulatory Visit: Payer: Self-pay | Admitting: Oncology

## 2010-08-05 ENCOUNTER — Encounter: Payer: Self-pay | Admitting: Internal Medicine

## 2010-08-05 LAB — CBC WITH DIFFERENTIAL/PLATELET
BASO%: 0.7 % (ref 0.0–2.0)
Basophils Absolute: 0 10*3/uL (ref 0.0–0.1)
EOS%: 4 % (ref 0.0–7.0)
Eosinophils Absolute: 0.2 10*3/uL (ref 0.0–0.5)
HCT: 38.9 % (ref 34.8–46.6)
HGB: 13.4 g/dL (ref 11.6–15.9)
LYMPH%: 19.8 % (ref 14.0–49.7)
MCH: 34.7 pg — ABNORMAL HIGH (ref 25.1–34.0)
MCHC: 34.4 g/dL (ref 31.5–36.0)
MCV: 101 fL (ref 79.5–101.0)
MONO#: 0.6 10*3/uL (ref 0.1–0.9)
MONO%: 9.1 % (ref 0.0–14.0)
NEUT#: 4.2 10*3/uL (ref 1.5–6.5)
NEUT%: 66.4 % (ref 38.4–76.8)
Platelets: 247 10*3/uL (ref 145–400)
RBC: 3.85 10*6/uL (ref 3.70–5.45)
RDW: 13.6 % (ref 11.2–14.5)
WBC: 6.3 10*3/uL (ref 3.9–10.3)
lymph#: 1.2 10*3/uL (ref 0.9–3.3)

## 2010-08-06 LAB — COMPREHENSIVE METABOLIC PANEL
ALT: 17 U/L (ref 0–35)
AST: 21 U/L (ref 0–37)
Albumin: 4.2 g/dL (ref 3.5–5.2)
Alkaline Phosphatase: 65 U/L (ref 39–117)
BUN: 25 mg/dL — ABNORMAL HIGH (ref 6–23)
CO2: 27 mEq/L (ref 19–32)
Calcium: 9.4 mg/dL (ref 8.4–10.5)
Chloride: 102 mEq/L (ref 96–112)
Creatinine, Ser: 1.67 mg/dL — ABNORMAL HIGH (ref 0.40–1.20)
Glucose, Bld: 85 mg/dL (ref 70–99)
Potassium: 4 mEq/L (ref 3.5–5.3)
Sodium: 138 mEq/L (ref 135–145)
Total Bilirubin: 0.3 mg/dL (ref 0.3–1.2)
Total Protein: 6.7 g/dL (ref 6.0–8.3)

## 2010-08-06 LAB — VITAMIN D 25 HYDROXY (VIT D DEFICIENCY, FRACTURES): Vit D, 25-Hydroxy: 38 ng/mL (ref 30–89)

## 2010-08-06 LAB — CANCER ANTIGEN 27.29: CA 27.29: 21 U/mL (ref 0–39)

## 2010-08-06 LAB — LACTATE DEHYDROGENASE: LDH: 143 U/L (ref 94–250)

## 2010-08-10 ENCOUNTER — Encounter: Payer: Self-pay | Admitting: Interventional Radiology

## 2010-08-19 NOTE — Letter (Signed)
[facility]   Imported By: Roderic Ovens 08/16/2009 16:35:27  _____________________________________________________________________  External Attachment:    Type:   Image     Comment:   External Document

## 2010-08-19 NOTE — Letter (Signed)
[facility]   Imported By: Roderic Ovens 02/17/2010 14:30:31  _____________________________________________________________________  External Attachment:    Type:   Image     Comment:   External Document

## 2010-09-10 NOTE — Letter (Signed)
Summary: Pierce Crane MD/[facility]  Pierce Crane MD/[facility]   Imported By: Lester Santa Cruz 09/05/2010 07:14:31  _____________________________________________________________________  External Attachment:    Type:   Image     Comment:   External Document

## 2010-09-11 ENCOUNTER — Other Ambulatory Visit: Payer: Self-pay | Admitting: General Surgery

## 2010-09-11 DIAGNOSIS — Z9010 Acquired absence of unspecified breast and nipple: Secondary | ICD-10-CM

## 2010-10-20 ENCOUNTER — Ambulatory Visit
Admission: RE | Admit: 2010-10-20 | Discharge: 2010-10-20 | Disposition: A | Discharge status: Home / Self Care | Payer: Medicare Other | Source: Ambulatory Visit | Attending: General Surgery | Admitting: General Surgery

## 2010-10-20 DIAGNOSIS — Z9010 Acquired absence of unspecified breast and nipple: Secondary | ICD-10-CM

## 2010-10-29 LAB — POCT I-STAT, CHEM 8
Chloride: 111 mEq/L (ref 96–112)
HCT: 37 % (ref 36.0–46.0)
Hemoglobin: 12.6 g/dL (ref 12.0–15.0)
Potassium: 3.9 mEq/L (ref 3.5–5.1)
Sodium: 139 mEq/L (ref 135–145)

## 2010-11-04 LAB — CROSSMATCH: Antibody Screen: NEGATIVE

## 2010-11-04 LAB — ABO/RH: ABO/RH(D): A NEG

## 2010-12-02 NOTE — Op Note (Signed)
Lydia Campbell, SHELLHAMMER           ACCOUNT NO.:  [id number]   MEDICAL RECORD NO.:  [id number]          PATIENT TYPE:  OIB   LOCATION:  5118                         FACILITY:  [facility]   PHYSICIAN:  Leonie Man, M.D.   DATE OF BIRTH:  [birth year]   DATE OF PROCEDURE:  03/28/2008  DATE OF DISCHARGE:                               OPERATIVE REPORT   PREOPERATIVE DIAGNOSIS:  Carcinoma of the left breast.   POSTOPERATIVE DIAGNOSIS:  Carcinoma of the left breast.   PROCEDURES:  1. Left total mastectomy.  2. Port-A-Cath implantation.   SURGEON:  Leonie Man, MD   ASSISTANT:  OR nurse.   ANESTHESIA:  General.   SPECIMEN:  Left breast.   ESTIMATED BLOOD LOSS:  Minimal.   COMPLICATIONS:  None.   The patient returned to the PACU in excellent condition.   Ms. Steppe is a 70 year old lady presenting with a 3.1-cm tumor in the  medial aspect of the left breast.  She underwent partial mastectomy of  the left upper inner quadrant with axillary lymph node dissection.  On  the lateral margin of the partial mastectomy, there was noted to be some  additional DCIS.  The margins around the tumor itself were clear, but  closely at 1.3 cm away from the posterior margin.  The patient now comes  to the operating room after the risks and potential benefits of surgery  have been fully discussed.  All questions are answered, consent obtained  for total mastectomy.  Because this is a triple positive tumor, the  patient is also going to have a Port-A-Cath implanted to facilitate  chemotherapy.   PROCEDURE:  With the patient positioned supinely following induction of  satisfactory general anesthesia, the left breast was prepped and draped  to be included in a sterile operative field, positive identification of  the patient as Lydia Campbell, and the procedure to be done as left  total mastectomy was carried out.  All additional preoperative  precautions were carried out satisfactorily.  I then  made an elliptical  incision extending from just medial to the sternal border all the way  down to the edge of latissimus dorsi, completely encircling the previous  biopsy cavity as well as most of the skin of the breast and nipple.  This was a superior flap and was raised up to the clavicle and carried  medially to the sternal border, inferior flap was raised down to the  rectus muscle and carried laterally to the anterior border of the  latissimus dorsi muscle.  The breast tissue was then dissected free,  moved off the pectoralis major muscle, starting medially and carrying  the dissection laterally.  The previous biopsy cavity was encountered  and was opened and that it went all the way down to her pectoralis major  muscle.  The dissection was carried over laterally and attachments to  the breast were dissected off the serratus anterior muscle.  The  specimen was marked with a double suture medially and single suture  superiorly for orientation and it was then forwarded for pathologic  evaluation.  Hemostasis was assured with electrocautery.  Sponge  and  instrument counts were thoroughly verified.  I placed 2 drains under the  flaps.  These were 19-French Blake drains and there was sutured in place  with 4-0 nylon sutures.  All areas of the dissection have been checked  for hemostasis and noted to be dry.  The wound was closed in 2 layers  with interrupted 2-0 Vicryl sutures in the subcutaneous layer and  stainless steel staples in the skin.  A sterile dressing was then placed  on the wound and the drains charged.   The attention was then turned to the Port-A-Cath.  The drapes were  removed and the right anterior chest was again prepped and draped to be  included in a new sterile operative field for Port-A-Cath implantation.  The patient was then placed in a Trendelenburg position after prepping  and draping and I made a subclavian stick into the right subclavian vein  and threaded a  guidewire into the superior vena cava.  I then created a  pocket on the anterior chest wall and then from the pocket tunneled a  Silastic catheter up to the shoulder.  With the Silastic catheter having  been flushed, I placed a Cook introducer dilator mechanism over the  guidewire and inserted this into the central venous system under  fluoroscopic control.  The guidewire and dilator were removed and the  Silastic catheter was positioned into the vena cava through the  introducer and positioned at the mid position of the atrium and the  innominate.  The inflow of heparinized saline and blood return were  noted to be excellent.  I then attached a reservoir to the external end  of the Port-A-Cath and injected heparinized saline through this and this  was also noted to aspirate and inject well.  The Port-A-Cath reservoir  was then implanted into the pocket and held in place with 2-0 Novofil  sutures.  Final evaluation under fluoroscopy showed that there were no  kinks, bends, or unusual turns.  The tip of the Port-A-Cath within the  superior vena cava and the Port-A-Cath reservoir itself was well  positioned.  The wounds were then checked for hemostasis and noted to be  dry.  Sponge and instrument counts were again verified.  The  subcutaneous tissues of the Port-A-Cath incision was then closed with  interrupted 3-0 Vicryl sutures.  Skin was closed with 4-0 Monocryl  suture.  Similarly, the shoulder wound was closed with 3-0 Vicryl and 4-  0 Monocryl.  Sterile dressings were then applied.  Anesthetic was  reversed.  The patient was removed from the operating room to the  recovery room in stable condition.  She tolerated the procedure well.      Leonie Man, M.D.  Electronically Signed     PB/MEDQ  D:  03/28/2008  T:  03/29/2008  Job:  [id number]

## 2010-12-02 NOTE — Op Note (Signed)
Lydia Campbell, Lydia Campbell           ACCOUNT NO.:  [id number]   MEDICAL RECORD NO.:  [id number]          PATIENT TYPE:  AMB   LOCATION:  NESC                         FACILITY:  [facility]   PHYSICIAN:  Ollen Gross, M.D.    DATE OF BIRTH:  1941-04-20   DATE OF PROCEDURE:  02/13/2009  DATE OF DISCHARGE:                               OPERATIVE REPORT   PREOPERATIVE DIAGNOSIS:  Left knee lateral meniscal tear.   POSTOPERATIVE DIAGNOSIS:  Left knee lateral meniscal tear.   PROCEDURE:  Left knee arthroscopy with lateral meniscal debridement.   SURGEON:  Dr. Lequita Halt.   ASSISTANT:  None.   ANESTHESIA:  Local MAC and then general.   ESTIMATED BLOOD LOSS:  Minimal.   DRAINS:  None.   COMPLICATIONS:  None.   CONDITION:  Stable to recovery.   BRIEF CLINICAL NOTE:  Lydia Campbell is a 70 year old female with a several-  month history of significant left knee pain and mechanical symptoms.  X-  ray showed minimal degenerative change.  MRI showed a lateral meniscal  tear which was consistent with her exam.  She presents now for  arthroscopy and debridement.   PROCEDURE IN DETAIL:  After attempted administration of knee block, a  tourniquet was placed on her left thigh and the left lower extremity  prepped and draped in the usual sterile fashion.  Standard superomedial  and inferolateral incisions were made.  Inflow cannula was passed,  superomedial camera passed inferolateral.  Arthroscopic visualization  proceeds.  The undersurface of the patella had some grade 2  degeneration, but no full-thickness chondral defects.  The trochlea had  similar findings.  Medial and lateral gutters were visualized and there  were no loose bodies.  Flexion and valgus force was applied to the knee  and the medical compartment entered.  A spinal needle was used to  localize an inferomedial portal.  A small incision made and dilator  placed.  The medial compartment was first visualized.  There is some  chondromalacia on the medial femoral condyle.  There is about a 1 x 2  area where there is some cartilage loss, but not all the way down to  bone.  I probed this and a tiny bit of this was unstable and debrided  back to a stable cartilaginous base.  The medial meniscus looked fine.  The intercondylar notch was visualized, the ACL was somewhat attenuated  but still intact.  The lateral compartment was entered and there was a  large tear in the posterior horn of the lateral meniscus.  I went to  probe this, she was not tolerating it well and thus was converted to a  general anesthetic.  Once the anesthetic was in place, then we debrided  the meniscus back to a stable base with baskets and a 4.2 mm shaver and  sealed it off with the ArthroCare device.  It was probed again and found  to be stable.  She does have degenerative change in the lateral  compartment.  A tiny area, about 5 x 5 mm of exposed bone on the tibial  plateau.  There was chondromalacia also  in the lateral femoral condyle,  but not full-thickness defects.  The joint again inspected.  There no  other tears or loose bodies noted.  The arthroscopic equipments are  removed from inferior portal and suture closed with interrupted 4-0  nylon.  Twenty mL of quarter-percent Marcaine with epinephrine injected  through the inflow cannula, then that was removed and that portal closed  with nylon.  A bulky sterile dressing was then applied and she was  awakened and transported to recovery in stable condition.      Ollen Gross, M.D.  Electronically Signed     FA/MEDQ  D:  02/13/2009  T:  02/14/2009  Job:  [id number]

## 2010-12-02 NOTE — Op Note (Signed)
Lydia Campbell, Lydia Campbell           ACCOUNT NO.:  [id number]   MEDICAL RECORD NO.:  [id number]          PATIENT TYPE:  AMB   LOCATION:  DSC                          FACILITY:  [facility]   PHYSICIAN:  Ollen Gross. Vernell Morgans, M.D. DATE OF BIRTH:  Apr 12, 1941   DATE OF PROCEDURE:  10/30/2008  DATE OF DISCHARGE:                               OPERATIVE REPORT   PREOPERATIVE DIAGNOSIS:  Seroma of the left chest wall.   POSTOPERATIVE DIAGNOSIS:  Seroma of the left chest wall.   PROCEDURE:  Placement of drain in left chest wall seroma.   SURGEON:  Ollen Gross. Vernell Morgans, MD   ANESTHESIA:  Local with IV sedation.   PROCEDURE:  After informed consent was obtained, the patient was brought  to the operating room and placed in supine position on the operating  room table.  After adequate IV sedation had been given, the patient's  left chest was prepped with Betadine and draped in usual sterile manner.  In the anterior axilla, the area of the seroma was infiltrated with 1%  lidocaine.  A small incision was made with a 15 blade knife.  A hemostat  was used to dissect through the subcutaneous tissue and then enter the  seroma cavity.  A 19-French round Harrison Mons drain was then placed down  through this opening along the chest wall into the seroma cavity.  Once  the drain was in good position, the drain was anchored to the skin with  a 3-0 nylon stitch.  The drain was placed to bulb suction.  Large amount  of serous fluid was able to be evacuated from the cavity.  Sterile  dressings were then applied.  The patient tolerated the procedure well.  At the end of the case, all needle, sponge, and instrument counts were  correct.  The patient was then awakened and taken to recovery in stable  condition.      Ollen Gross. Vernell Morgans, M.D.  Electronically Signed     PST/MEDQ  D:  10/30/2008  T:  10/31/2008  Job:  [id number]

## 2010-12-02 NOTE — Op Note (Signed)
NAMEFLORIENE, JESCHKE           ACCOUNT NO.:  [id number]   MEDICAL RECORD NO.:  [id number]          PATIENT TYPE:  AMB   LOCATION:  DSC                          FACILITY:  [facility]   PHYSICIAN:  Leonie Man, M.D.   DATE OF BIRTH:  1940-12-15   DATE OF PROCEDURE:  03/15/2008  DATE OF DISCHARGE:                               OPERATIVE REPORT   PREOPERATIVE DIAGNOSIS:  Invasive carcinoma of the left breast.   POSTOPERATIVE DIAGNOSIS:  Invasive carcinoma of the left breast.   PROCEDURE:  Blue dye injection, left partial mastectomy, and left  axillary lymph node dissection.   SURGEON:  Leonie Man, MD   ASSISTANT:  OR nurse.   ANESTHESIA:  General.   SPECIMENS TO LAB:  Left-sided breast tissue of her inner quadrant and  left axillary lymph nodes.   ESTIMATED BLOOD LOSS:  Minimal.   COMPLICATIONS:  None.   The patient returned to the PACU in excellent condition.   INDICATIONS FOR PROCEDURE:  Ms. Verneda Hollopeter is a 70 year old lady  who on self breast exam discovered a left-sided breast mass which on  mammogram shows a 2.6 cm lesion and on MRI shows a 3.1 cm lesion located  in the 10 o'clock axis of the left breast.  This lesion is estrogen and  progesterone receptor positive or Ki-67 evaluation is still pending.  Ms. Hubbs comes to the operating room now for left-sided partial  mastectomy and sentinel lymph node biopsy with possibility of axillary  lymph node dissection.   The risks and potential benefits of surgery have been fully discussed  with the patient including the risk of permanent lymphedema of the left  arm if indeed a left axillary dissection is done.  The patient  understands and gives her consent same.   PROCEDURE:  The patient is positioned supinely following the induction  of satisfactory general endotracheal anesthesia.  The left breast and  left axilla are prepped and draped to be included in the sterile  operative field.  Blue dye injection in  the subareolar region was  carried out after positive identification of the patient as Amamda Curbow and the operation to be done as left partial mastectomy and  sentinel lymph node biopsy for possibility of axillary lymph node  dissection.  All other surgical preoperative precautions are carried  out.   Blue dye was massaged into the breast tissues for approximately 5  minutes and then I palpated the lesion in the 10 o'clock axis of the  left breast.  I made an elliptical incision in the skin overlying the  lesion deepening this through the skin and raising superior and inferior  medial and lateral flaps so as to get a wider margins around the tumor.  The dissection was carried all the way down to the pectoralis major  muscle and the entire upper inner quadrant of the left breast was  removed in its entirety.  The medial margin was marked with a long silk  suture, superior margin marked with 2 sutures.  The specimen was then  forwarded for pathologic evaluation.  The breast wound was then packed  and attention turned towards the axilla.   With the use of the NeoProbe, the left axilla was probed for several  minutes without any increased counts over background to be found.  I  then made a left-sided transaxillary incision and deepened this through  the skin and subcutaneous tissue dissecting down to the region of the  lymph node bearing tissues and again with the use of the NeoProbe, I  could not find a sentinel node.  Additional dissection within the axilla  did not show any nodes stained blue.  At this point, decision was made  to go ahead with left axillary lymphadenectomy.  I extended the axillary  incision up to the pectoralis major muscle anterior and down to the edge  of the latissimus dorsi muscle posteriorly.  I freed the attachments of  the axillary fat to the latissimus and to the pectoralis major and  carried the dissection up underneath the pectoralis major and onto  the  pectoralis minor up to the region of [facility] ligament.  The left  axillary vein was identified and the lymph nodes starting at the apical  lymph node was taken and axillary fat pad was taken off of the vein,  carrying the dissection laterally maintaining hemostasis along the way  with hemoclips.  In the lateral dissection, the intercostal humeral  nerve was taken.  The long thoracic and thoracodorsal nerves were  identified and spared.  The entire fat pad was dissected free from the  axillary vein and additional attachments to the serratus anterior and  latissimus dorsi muscle were taken and removed.  This axillary  dissection was forwarded for pathologic evaluation.  Parenthetically, at  the end of the dissection, I did find a blue stained node which was not  hot by a NeoProbe testing.  All the areas of dissection within the  axilla were then checked for hemostasis.  We had additional points  treated with electrocautery.  The wound was then thoroughly irrigated  with normal saline.  The subcutaneous tissues were then closed with  interrupted 2-0 Vicryl sutures after a 10-mm flat Jackson-Pratt drain  was placed in the axilla for drainage.  The skin was then closed with a  running 5-0 Monocryl suture and reinforced with Steri-Strips.  The  breast wound was also inspected for hemostasis and irrigated thoroughly.  Sponge and instrument counts here were verified again and the wound  closed with interrupted 2-0 Vicryl sutures in the subcutaneous tissues  and 5-0 Monocryl in the skin and this was reinforced with Dermabond and  Steri-Strips.  Sterile dressings were applied.  The anesthetic reversed  and the patient removed from the operating room to the recovery room in  stable condition.  She tolerated the procedure well.      Leonie Man, M.D.  Electronically Signed     PB/MEDQ  D:  03/15/2008  T:  03/16/2008  Job:  [id number]

## 2011-02-18 DIAGNOSIS — I2699 Other pulmonary embolism without acute cor pulmonale: Secondary | ICD-10-CM

## 2011-02-18 DIAGNOSIS — Z86718 Personal history of other venous thrombosis and embolism: Secondary | ICD-10-CM

## 2011-02-18 HISTORY — DX: Other pulmonary embolism without acute cor pulmonale: I26.99

## 2011-02-18 HISTORY — DX: Personal history of other venous thrombosis and embolism: Z86.718

## 2011-02-27 ENCOUNTER — Other Ambulatory Visit: Payer: Self-pay | Admitting: Oncology

## 2011-02-27 ENCOUNTER — Encounter: Payer: Medicare Other | Admitting: Oncology

## 2011-02-27 LAB — CBC WITH DIFFERENTIAL/PLATELET
Basophils Absolute: 0 10*3/uL (ref 0.0–0.1)
Eosinophils Absolute: 0.2 10*3/uL (ref 0.0–0.5)
HCT: 38.1 % (ref 34.8–46.6)
LYMPH%: 23.3 % (ref 14.0–49.7)
MCV: 99.8 fL (ref 79.5–101.0)
MONO#: 0.2 10*3/uL (ref 0.1–0.9)
MONO%: 4.2 % (ref 0.0–14.0)
NEUT#: 4.1 10*3/uL (ref 1.5–6.5)
NEUT%: 68.8 % (ref 38.4–76.8)
Platelets: 214 10*3/uL (ref 145–400)
RBC: 3.82 10*6/uL (ref 3.70–5.45)
WBC: 6 10*3/uL (ref 3.9–10.3)

## 2011-02-27 LAB — COMPREHENSIVE METABOLIC PANEL
AST: 19 U/L (ref 0–37)
Albumin: 4.1 g/dL (ref 3.5–5.2)
Alkaline Phosphatase: 49 U/L (ref 39–117)
Glucose, Bld: 104 mg/dL — ABNORMAL HIGH (ref 70–99)
Potassium: 4.2 mEq/L (ref 3.5–5.3)
Sodium: 139 mEq/L (ref 135–145)
Total Protein: 6.5 g/dL (ref 6.0–8.3)

## 2011-03-06 ENCOUNTER — Encounter (HOSPITAL_BASED_OUTPATIENT_CLINIC_OR_DEPARTMENT_OTHER): Payer: Medicare Other | Admitting: Oncology

## 2011-03-06 DIAGNOSIS — R609 Edema, unspecified: Secondary | ICD-10-CM

## 2011-03-06 DIAGNOSIS — C50219 Malignant neoplasm of upper-inner quadrant of unspecified female breast: Secondary | ICD-10-CM

## 2011-03-06 DIAGNOSIS — Z7982 Long term (current) use of aspirin: Secondary | ICD-10-CM

## 2011-03-06 DIAGNOSIS — IMO0001 Reserved for inherently not codable concepts without codable children: Secondary | ICD-10-CM

## 2011-04-21 LAB — PROTIME-INR: Prothrombin Time: 12.5

## 2011-04-22 LAB — BASIC METABOLIC PANEL
BUN: 20
CO2: 28
Calcium: 9.6
Chloride: 104
Creatinine, Ser: 0.96

## 2011-04-22 LAB — CBC
MCHC: 33.8
MCV: 98.3
Platelets: 230
Platelets: 309
RDW: 12.9
WBC: 20.5 — ABNORMAL HIGH

## 2011-04-23 ENCOUNTER — Encounter (HOSPITAL_BASED_OUTPATIENT_CLINIC_OR_DEPARTMENT_OTHER): Payer: Medicare Other | Admitting: Oncology

## 2011-04-23 DIAGNOSIS — C50219 Malignant neoplasm of upper-inner quadrant of unspecified female breast: Secondary | ICD-10-CM

## 2011-04-23 DIAGNOSIS — I2699 Other pulmonary embolism without acute cor pulmonale: Secondary | ICD-10-CM

## 2011-04-23 DIAGNOSIS — Z170 Estrogen receptor positive status [ER+]: Secondary | ICD-10-CM

## 2011-06-03 ENCOUNTER — Ambulatory Visit (INDEPENDENT_AMBULATORY_CARE_PROVIDER_SITE_OTHER): Payer: Self-pay | Admitting: General Surgery

## 2011-06-08 ENCOUNTER — Encounter (INDEPENDENT_AMBULATORY_CARE_PROVIDER_SITE_OTHER): Payer: Self-pay | Admitting: General Surgery

## 2011-06-09 ENCOUNTER — Ambulatory Visit (INDEPENDENT_AMBULATORY_CARE_PROVIDER_SITE_OTHER): Payer: Medicare Other | Admitting: General Surgery

## 2011-06-09 ENCOUNTER — Encounter (INDEPENDENT_AMBULATORY_CARE_PROVIDER_SITE_OTHER): Payer: Self-pay | Admitting: General Surgery

## 2011-06-09 VITALS — BP 116/74 | HR 60 | Temp 96.9°F | Resp 16 | Ht 68.0 in | Wt 218.4 lb

## 2011-06-09 DIAGNOSIS — C50919 Malignant neoplasm of unspecified site of unspecified female breast: Secondary | ICD-10-CM

## 2011-06-09 NOTE — Patient Instructions (Signed)
Continue regular self exams  

## 2011-06-15 ENCOUNTER — Encounter (INDEPENDENT_AMBULATORY_CARE_PROVIDER_SITE_OTHER): Payer: Self-pay | Admitting: General Surgery

## 2011-06-15 NOTE — Progress Notes (Signed)
Subjective:     Patient ID: Lydia Campbell, female   DOB: 1941/01/31, 70 y.o.   MRN: [id number]  HPI The patient is a 70 year old white female who is now 3 years out from a left mastectomy for a T2 N0 left breast cancer. Her postoperative course was complicated by large seroma which is now resolved. Since her last visit she did have a pulmonary embolism in August. She was taken off tamoxifen for one month and then restarted. She is now taking Coumadin. She has no complaints today. Her appetite is good and her bowels are working normally.  Review of Systems  Constitutional: Negative.   HENT: Negative.   Eyes: Negative.   Respiratory: Negative.   Cardiovascular: Negative.   Genitourinary: Negative.   Musculoskeletal: Negative.   Skin: Negative.   Neurological: Negative.   Hematological: Negative.   Psychiatric/Behavioral: Negative.        Objective:   Physical Exam  Constitutional: She is oriented to person, place, and time. She appears well-developed and well-nourished.  HENT:  Head: Normocephalic and atraumatic.  Eyes: Conjunctivae and EOM are normal. Pupils are equal, round, and reactive to light.  Neck: Normal range of motion. Neck supple.  Cardiovascular: Normal rate, regular rhythm and normal heart sounds.   Pulmonary/Chest: Effort normal and breath sounds normal.       No palpable mass of the left chest wall. No palpable mass of the right breast. No axillary supraclavicular or cervical lymphadenopathy  Abdominal: Soft. Bowel sounds are normal. She exhibits no mass. There is no tenderness.  Musculoskeletal: Normal range of motion.  Neurological: She is alert and oriented to person, place, and time.  Skin: Skin is warm and dry.  Psychiatric: She has a normal mood and affect. Her behavior is normal.       Assessment:     3 year status post left mastectomy    Plan:     At this point she will continue to do regular self exams. She will continue her tamoxifen and her  Coumadin. She will follow up with Dr. Donnie Coffin. We will plan to see her back in about 6 months

## 2011-06-22 ENCOUNTER — Telehealth: Payer: Self-pay | Admitting: *Deleted

## 2011-06-22 NOTE — Telephone Encounter (Signed)
patient confirmed over the phone the new date and time for 07-24-2011 and 03-04-2012.

## 2011-07-24 ENCOUNTER — Telehealth: Payer: Self-pay | Admitting: Oncology

## 2011-07-24 ENCOUNTER — Ambulatory Visit (HOSPITAL_BASED_OUTPATIENT_CLINIC_OR_DEPARTMENT_OTHER): Payer: Medicare Other | Admitting: Oncology

## 2011-07-24 ENCOUNTER — Other Ambulatory Visit: Payer: Medicare Other | Admitting: Lab

## 2011-07-24 ENCOUNTER — Other Ambulatory Visit: Payer: Self-pay | Admitting: Oncology

## 2011-07-24 VITALS — BP 106/67 | HR 76 | Temp 97.8°F | Ht 68.0 in | Wt 223.8 lb

## 2011-07-24 DIAGNOSIS — E559 Vitamin D deficiency, unspecified: Secondary | ICD-10-CM

## 2011-07-24 DIAGNOSIS — R609 Edema, unspecified: Secondary | ICD-10-CM

## 2011-07-24 DIAGNOSIS — IMO0001 Reserved for inherently not codable concepts without codable children: Secondary | ICD-10-CM

## 2011-07-24 DIAGNOSIS — Z7901 Long term (current) use of anticoagulants: Secondary | ICD-10-CM

## 2011-07-24 DIAGNOSIS — C50919 Malignant neoplasm of unspecified site of unspecified female breast: Secondary | ICD-10-CM

## 2011-07-24 LAB — CBC WITH DIFFERENTIAL/PLATELET
BASO%: 1.7 % (ref 0.0–2.0)
LYMPH%: 20.8 % (ref 14.0–49.7)
MCHC: 34.5 g/dL (ref 31.5–36.0)
MONO#: 0.4 10*3/uL (ref 0.1–0.9)
Platelets: 224 10*3/uL (ref 145–400)
RBC: 3.76 10*6/uL (ref 3.70–5.45)
RDW: 13.1 % (ref 11.2–14.5)
WBC: 6.6 10*3/uL (ref 3.9–10.3)
lymph#: 1.4 10*3/uL (ref 0.9–3.3)

## 2011-07-24 LAB — PROTIME-INR
INR: 2.6 (ref 2.00–3.50)
Protime: 31.2 Seconds — ABNORMAL HIGH (ref 10.6–13.4)

## 2011-07-24 NOTE — Progress Notes (Signed)
Hematology and Oncology Follow Up Visit  Lydia Campbell [id number] 1941/06/19 71 y.o. 07/24/2011 1:49 PM PCP dr Ladean Raya, [place]; dr Garth Bigness toth  Principle Diagnosis: T2 No lt breast ca s/p mrm 03/28/08, on tamoxifen after multiple AI trials  complicated by PE. Last mammogram 4/12  Interim History:  There have been no intercurrent illness, hospitalizations or medication changes.  Medications: I have reviewed the patient's current medications.  Allergies:  Allergies  Allergen Reactions  . Morphine And Related     Nausea   . Penicillins     seizures    Past Medical History, Surgical history, Social history, and Family History were reviewed and updated.  Review of Systems: Constitutional:  Negative for fever, chills, night sweats, anorexia, weight loss, pain. Cardiovascular: no chest pain or dyspnea on exertion Respiratory: no cough, shortness of breath, or wheezing Neurological: negative Dermatological: negative ENT: negative Skin Gastrointestinal: no abdominal pain, change in bowel habits, or black or bloody stools Genito-Urinary: no dysuria, trouble voiding, or hematuria Hematological and Lymphatic: negative Breast: negative for breast lumps negative Musculoskeletal: negative Remaining ROS negative.  Physical Exam: Blood pressure 106/67, pulse 76, temperature 97.8 F (36.6 C), height 5\' 8"  (1.727 m), weight 223 lb 12.8 oz (101.515 kg). ECOG: 0 General appearance: alert, cooperative and appears stated age Head: Normocephalic, without obvious abnormality, atraumatic Neck: no adenopathy, no carotid bruit, no JVD, supple, symmetrical, trachea midline and thyroid not enlarged, symmetric, no tenderness/mass/nodules Lymph nodes: Cervical, supraclavicular, and axillary nodes normal. Cardiac : regular rate and rhythm, no murmurs or gallops Pulmonary:clear to auscultation bilaterally and normal percussion bilaterally Breasts: inspection negative, no nipple discharge or  bleeding, no masses or nodularity palpable Abdomen:soft, non-tender; bowel sounds normal; no masses,  no organomegaly Extremities negative Neuro: alert, oriented, normal speech, no focal findings or movement disorder noted  Lab Results: Lab Results  Component Value Date   WBC 6.6 07/24/2011   HGB 12.9 07/24/2011   HCT 37.5 07/24/2011   MCV 99.6 07/24/2011   PLT 224 07/24/2011     Chemistry      Component Value Date/Time   NA 139 02/27/2011 1338   NA 139 02/27/2011 1338   NA 139 02/27/2011 1338   K 4.2 02/27/2011 1338   K 4.2 02/27/2011 1338   K 4.2 02/27/2011 1338   CL 104 02/27/2011 1338   CL 104 02/27/2011 1338   CL 104 02/27/2011 1338   CO2 22 02/27/2011 1338   CO2 22 02/27/2011 1338   CO2 22 02/27/2011 1338   BUN 25* 02/27/2011 1338   BUN 25* 02/27/2011 1338   BUN 25* 02/27/2011 1338   CREATININE 1.04 02/27/2011 1338   CREATININE 1.04 02/27/2011 1338   CREATININE 1.04 02/27/2011 1338      Component Value Date/Time   CALCIUM 9.4 02/27/2011 1338   CALCIUM 9.4 02/27/2011 1338   CALCIUM 9.4 02/27/2011 1338   ALKPHOS 49 02/27/2011 1338   ALKPHOS 49 02/27/2011 1338   ALKPHOS 49 02/27/2011 1338   AST 19 02/27/2011 1338   AST 19 02/27/2011 1338   AST 19 02/27/2011 1338   ALT 14 02/27/2011 1338   ALT 14 02/27/2011 1338   ALT 14 02/27/2011 1338   BILITOT 0.3 02/27/2011 1338   BILITOT 0.3 02/27/2011 1338   BILITOT 0.3 02/27/2011 1338      .pathology. Radiological Studies: chest X-ray n/a Mammogram n/a Bone density Due in 6 mo  Impression and Plan: Ms ozimek is doing well  And remains on coumadin/tamoxifen .  Her PT remains stable on alt 5/2.5 mg F/u 6 months  More than 50% of the visit was spent in patient-related counselling   Pierce Crane, MD 1/4/20131:49 PM

## 2011-07-24 NOTE — Telephone Encounter (Signed)
gve the pt her bone density appt for June 2013.

## 2011-07-25 LAB — COMPREHENSIVE METABOLIC PANEL
ALT: 14 U/L (ref 0–35)
Alkaline Phosphatase: 46 U/L (ref 39–117)
CO2: 25 mEq/L (ref 19–32)
Sodium: 141 mEq/L (ref 135–145)
Total Bilirubin: 0.3 mg/dL (ref 0.3–1.2)
Total Protein: 6.4 g/dL (ref 6.0–8.3)

## 2011-07-25 LAB — VITAMIN D 25 HYDROXY (VIT D DEFICIENCY, FRACTURES): Vit D, 25-Hydroxy: 50 ng/mL (ref 30–89)

## 2011-09-24 ENCOUNTER — Other Ambulatory Visit: Payer: Self-pay | Admitting: Oncology

## 2011-09-24 DIAGNOSIS — C50319 Malignant neoplasm of lower-inner quadrant of unspecified female breast: Secondary | ICD-10-CM

## 2011-10-08 ENCOUNTER — Other Ambulatory Visit (INDEPENDENT_AMBULATORY_CARE_PROVIDER_SITE_OTHER): Payer: Self-pay | Admitting: General Surgery

## 2011-10-08 DIAGNOSIS — Z9012 Acquired absence of left breast and nipple: Secondary | ICD-10-CM

## 2011-10-08 DIAGNOSIS — Z1231 Encounter for screening mammogram for malignant neoplasm of breast: Secondary | ICD-10-CM

## 2011-10-19 ENCOUNTER — Other Ambulatory Visit: Payer: Self-pay | Admitting: Gastroenterology

## 2011-10-21 ENCOUNTER — Ambulatory Visit
Admission: RE | Admit: 2011-10-21 | Discharge: 2011-10-21 | Disposition: A | Discharge status: Home / Self Care | Payer: Medicare Other | Source: Ambulatory Visit | Attending: Gastroenterology | Admitting: Gastroenterology

## 2011-10-21 MED ORDER — IOHEXOL 300 MG/ML  SOLN
125.0000 mL | Freq: Once | INTRAMUSCULAR | Status: AC | PRN
  Administered 2011-10-21: 125 mL via INTRAVENOUS

## 2011-10-29 ENCOUNTER — Ambulatory Visit
Admission: RE | Admit: 2011-10-29 | Discharge: 2011-10-29 | Disposition: A | Discharge status: Home / Self Care | Payer: Medicare Other | Source: Ambulatory Visit | Attending: General Surgery | Admitting: General Surgery

## 2011-10-29 DIAGNOSIS — Z9012 Acquired absence of left breast and nipple: Secondary | ICD-10-CM

## 2011-10-29 DIAGNOSIS — Z1231 Encounter for screening mammogram for malignant neoplasm of breast: Secondary | ICD-10-CM

## 2011-11-04 ENCOUNTER — Ambulatory Visit (INDEPENDENT_AMBULATORY_CARE_PROVIDER_SITE_OTHER): Payer: Medicare Other | Admitting: Adult Health

## 2011-11-04 ENCOUNTER — Encounter: Payer: Self-pay | Admitting: Cardiology

## 2011-11-04 VITALS — BP 123/72 | HR 69 | Resp 18 | Ht 67.0 in | Wt 220.0 lb

## 2011-11-04 DIAGNOSIS — I080 Rheumatic disorders of both mitral and aortic valves: Secondary | ICD-10-CM

## 2011-11-04 NOTE — Progress Notes (Signed)
HPI: Lydia Campbell is a 71 y/o patient of Dr. Diona Browner who we are seeing for pre-operative evaluation for right total knee replacement. Dr. Salvatore Marvel plans to due surgery on December 21, 2011. She has a history of PE and is on coumadin, followed by Dr. Caryl Asp, for INR checks. She has a history of breast cancer and has undergone left radical mastectomy. She is currently cancer free. She has also been diagnosed with Asthma and is followed by a pulmonologist, who she is also seeing for pre-operative evaluation.  She has had no admissions for chest pain, or diagnosis of CAD. She remains active with the limitations of her knee pain. She is medically compliant. She denies chest pain, DOE, weakness, fatigue. She is obese with some issues concerning this for ambulation. She was seen last by Dr. Diona Browner for evaluation concerning mild MR. Echo revealed Normal LV fx with EF of 65%, mild MR, no wall motion abnormalities.  At that time it was found to be of little concern as she was without symptoms. We were to follow her on prn basis. We are happy to see her today for pre-operative eval.  Allergies  Allergen Reactions  . Morphine And Related     Nausea   . Penicillins     seizures    Current Outpatient Prescriptions  Medication Sig Dispense Refill  . allopurinol (ZYLOPRIM) 300 MG tablet Take 300 mg by mouth daily.        . Ascorbic Acid (VITAMIN C) 1000 MG tablet Take 1,000 mg by mouth daily.        . B Complex-Biotin-FA (B-COMPLEX PO) Take 100 mg by mouth at bedtime.        . Biotin 5000 MCG CAPS Take by mouth.        . calcium carbonate (CALCIUM 500) 1250 MG tablet Take 1 tablet by mouth daily.      . cetirizine (ZYRTEC) 10 MG tablet Take 10 mg by mouth daily.        . cholecalciferol (VITAMIN D) 400 UNITS TABS Take by mouth.        . citalopram (CELEXA) 10 MG tablet       . Coenzyme Q10 (COQ10) 100 MG CAPS Take by mouth 2 (two) times daily.      Marland Kitchen MAGNESIUM PO Take 50 mg by mouth 3 (three) times  daily.        . nabumetone (RELAFEN) 500 MG tablet Take 500 mg by mouth daily.       Marland Kitchen omeprazole (PRILOSEC) 20 MG capsule Take 20 mg by mouth daily.        . pantoprazole (PROTONIX) 40 MG tablet Take 40 mg by mouth daily.        . Potassium Chloride Crys CR (KLOR-CON M20 PO) Take 1 tablet by mouth 2 (two) times daily.        Marland Kitchen QVAR 80 MCG/ACT inhaler       . tamoxifen (NOLVADEX) 20 MG tablet TAKE 1 TABLET DAILY  90 tablet  1  . traZODone (DESYREL) 50 MG tablet Take 50 mg by mouth at bedtime.        . triamterene-hydrochlorothiazide (MAXZIDE) 75-50 MG per tablet Take 1 tablet by mouth 2 (two) times daily.       Marland Kitchen warfarin (COUMADIN) 5 MG tablet 5mg  alternating with 2.5mg         Past Medical History  Diagnosis Date  . GERD (gastroesophageal reflux disease)   . Arthritis   . Asthma   .  Blood transfusion   . Cancer   . Clotting disorder     Past Surgical History  Procedure Date  . Abdominal hysterectomy   . Tonsillectomy   . Breast surgery   . Knee cartilage surgery 2001 and 2009    Family History  Problem Relation Age of Onset  . Heart disease Mother   . Diabetes Father   . Heart disease Father   . Heart disease Brother     History   Social History  . Marital Status: Married    Spouse Name: N/A    Number of Children: N/A  . Years of Education: N/A   Occupational History  . Not on file.   Social History Main Topics  . Smoking status: Former Games developer  . Smokeless tobacco: Never Used  . Alcohol Use: Yes  . Drug Use: No  . Sexually Active:    Other Topics Concern  . Not on file   Social History Narrative  . No narrative on file    ROS: Review of systems complete and found to be negative unless listed above  PHYSICAL EXAM BP 123/72  Pulse 69  Resp 18  Ht 5\' 7"  (1.702 m)  Wt 220 lb (99.791 kg)  BMI 34.46 kg/m2  General: Well developed, well nourished, in no acute distress, mildly obese Head: Eyes PERRLA, No xanthomas.   Normal cephalic and atramatic,  wears glasses.  Lungs: Clear bilaterally to auscultation and percussion. Mild upper airway wheezes are noted. Heart: HRRR S1 S2, without MRG.  Pulses are 2+ & equal.            No carotid bruit. No JVD.  No abdominal bruits. No femoral bruits. Abdomen: Bowel sounds are positive, abdomen soft and non-tender without masses or                  Hernia's noted. Msk:  Back normal, normal gait. Normal strength and tone for age. Extremities: No clubbing, cyanosis or edema.  DP +1 Neuro: Alert and oriented X 3. Psych:  Good affect, responds appropriately EKG:NSR rate of  69 bpm  ASSESSMENT AND PLAN

## 2011-11-04 NOTE — Assessment & Plan Note (Signed)
She is without complaint from a cardiac complaint without admissions with cardiac cause. Blood pressure is well controlled. EKG is normal.  She is of low and reasonable risk to proceed with Right TKR. She is currently on coumadin that will need to be held with lovenox bridging prior to surgery and restart as soon as reasonable per Dr. Thurston Hole. No further cardiac testing is planned.

## 2011-11-04 NOTE — Patient Instructions (Signed)
**Note De-identified [place] Obfuscation** Your physician recommends that you schedule a follow-up appointment in: as needed Your physician recommends that you continue on your current medications as directed. Please refer to the Current Medication list given to you today.   

## 2011-11-20 ENCOUNTER — Encounter (INDEPENDENT_AMBULATORY_CARE_PROVIDER_SITE_OTHER): Payer: Self-pay | Admitting: General Surgery

## 2011-11-24 ENCOUNTER — Ambulatory Visit
Admission: RE | Admit: 2011-11-24 | Discharge: 2011-11-24 | Disposition: A | Discharge status: Home / Self Care | Payer: Medicare Other | Source: Ambulatory Visit | Attending: Oncology | Admitting: Oncology

## 2011-11-24 DIAGNOSIS — C50919 Malignant neoplasm of unspecified site of unspecified female breast: Secondary | ICD-10-CM

## 2011-11-24 DIAGNOSIS — E559 Vitamin D deficiency, unspecified: Secondary | ICD-10-CM

## 2011-12-04 ENCOUNTER — Encounter (HOSPITAL_COMMUNITY): Payer: Self-pay | Admitting: Pharmacy Technician

## 2011-12-07 ENCOUNTER — Inpatient Hospital Stay (HOSPITAL_COMMUNITY): Admission: RE | Admit: 2011-12-07 | Discharge: 2011-12-07 | Payer: Medicare Other | Source: Ambulatory Visit

## 2011-12-07 ENCOUNTER — Encounter (HOSPITAL_COMMUNITY): Payer: Self-pay

## 2011-12-07 HISTORY — DX: Personal history of other diseases of the digestive system: Z87.19

## 2011-12-07 HISTORY — DX: Personal history of other venous thrombosis and embolism: Z86.718

## 2011-12-07 HISTORY — DX: Cardiac murmur, unspecified: R01.1

## 2011-12-07 HISTORY — DX: Essential (primary) hypertension: I10

## 2011-12-07 HISTORY — DX: Fibromyalgia: M79.7

## 2011-12-07 HISTORY — DX: Anxiety disorder, unspecified: F41.9

## 2011-12-07 HISTORY — DX: Unspecified convulsions: R56.9

## 2011-12-07 NOTE — Progress Notes (Signed)
Cardiac clearance note from4/13 [place] in epic, echo 8/10 ,ekg4/13

## 2011-12-08 ENCOUNTER — Encounter: Payer: Self-pay | Admitting: Physician Assistant

## 2011-12-08 ENCOUNTER — Encounter (INDEPENDENT_AMBULATORY_CARE_PROVIDER_SITE_OTHER): Payer: Self-pay | Admitting: General Surgery

## 2011-12-08 ENCOUNTER — Ambulatory Visit (INDEPENDENT_AMBULATORY_CARE_PROVIDER_SITE_OTHER): Payer: Medicare Other | Admitting: General Surgery

## 2011-12-08 ENCOUNTER — Other Ambulatory Visit (HOSPITAL_COMMUNITY): Payer: Medicare Other

## 2011-12-08 ENCOUNTER — Other Ambulatory Visit: Payer: Self-pay | Admitting: Physician Assistant

## 2011-12-08 VITALS — BP 118/72 | HR 76 | Temp 97.4°F | Resp 18 | Ht 67.0 in | Wt 224.8 lb

## 2011-12-08 DIAGNOSIS — M1712 Unilateral primary osteoarthritis, left knee: Secondary | ICD-10-CM

## 2011-12-08 DIAGNOSIS — M1990 Unspecified osteoarthritis, unspecified site: Secondary | ICD-10-CM | POA: Insufficient documentation

## 2011-12-08 DIAGNOSIS — K219 Gastro-esophageal reflux disease without esophagitis: Secondary | ICD-10-CM | POA: Insufficient documentation

## 2011-12-08 DIAGNOSIS — F419 Anxiety disorder, unspecified: Secondary | ICD-10-CM | POA: Insufficient documentation

## 2011-12-08 DIAGNOSIS — M797 Fibromyalgia: Secondary | ICD-10-CM

## 2011-12-08 DIAGNOSIS — C801 Malignant (primary) neoplasm, unspecified: Secondary | ICD-10-CM

## 2011-12-08 DIAGNOSIS — G4730 Sleep apnea, unspecified: Secondary | ICD-10-CM | POA: Insufficient documentation

## 2011-12-08 DIAGNOSIS — M1711 Unilateral primary osteoarthritis, right knee: Secondary | ICD-10-CM

## 2011-12-08 DIAGNOSIS — I10 Essential (primary) hypertension: Secondary | ICD-10-CM | POA: Insufficient documentation

## 2011-12-08 DIAGNOSIS — C50919 Malignant neoplasm of unspecified site of unspecified female breast: Secondary | ICD-10-CM

## 2011-12-08 DIAGNOSIS — Z86718 Personal history of other venous thrombosis and embolism: Secondary | ICD-10-CM

## 2011-12-08 NOTE — H&P (Signed)
Lydia Campbell is an 71 y.o. female.   Chief Complaint: bilateral knee DJD right worse than left HPI: Lydia Campbell is a 71 year old seen at the request of Dr. Charlett Blake for significant bilateral knee degenerative joint disease that's been long-standing getting progressively worse. Significant problems with weightbearing with pain and significant gait abnormality. The excruciating pain is getting progressively worse, limiting activities of daily living, poses a significant fall risk, and has failed multiple conservative treatments including intraarticular cortisone injections, intraarticular Supartz, bracing, medication and home physical therapy.   Of note is that she has a history of DVT and PE nine months ago which is presumed to be secondary to Tamoxifen treatment for breast cancer. She's under the care of Dr. Pierce Crane for breast cancer, she had surgery in 2009. She's seen [facility] in the past when she was on chemotherapy but hasn't seen them in the past two years. She also had a problem with O2 dependency when she did have her PE and was under the care of Dr. Genoveva Ill of pulmonology in [place].   Past Medical History  Diagnosis Date  . GERD (gastroesophageal reflux disease)   . Arthritis   . Asthma   . Blood transfusion   . Clotting disorder   . Sleep apnea   . Heart murmur   . Hypertension   . Hx of blood clots 8/12    lungs  . H/O hiatal hernia   . Seizures     from pcn  . Cancer     breast lft  . Fibromyalgia   . Anxiety   . Right knee DJD   . Left knee DJD     Past Surgical History  Procedure Date  . Abdominal hysterectomy   . Tonsillectomy   . Breast surgery   . Knee cartilage surgery 2001 and 2009    bil  . Breast surgery 03-28-08    lft  . Mastectomy     lft    Family History  Problem Relation Age of Onset  . Heart disease Mother   . Diabetes Father   . Heart disease Father   . Heart disease Brother    Social History:  reports that she quit  smoking about 43 years ago. Her smoking use included Cigarettes. She quit after 5 years of use. She has never used smokeless tobacco. She reports that she does not drink alcohol or use illicit drugs.  Allergies:  Allergies  Allergen Reactions  . Morphine And Related Nausea Only    Nausea   . Penicillins Other (See Comments)    seizures    Current Outpatient Prescriptions on File Prior to Visit  Medication Sig Dispense Refill  . allopurinol (ZYLOPRIM) 300 MG tablet Take 300 mg by mouth daily.       . Ascorbic Acid (VITAMIN C) 1000 MG tablet Take 1,000 mg by mouth daily.       . B Complex-Biotin-FA (B-COMPLEX PO) Take 100 mg by mouth at bedtime.       . Biotin 5000 MCG CAPS Take 1 capsule by mouth daily.       . calcium carbonate (CALCIUM 500) 1250 MG tablet Take 1 tablet by mouth daily.      . cetirizine (ZYRTEC) 10 MG tablet Take 10 mg by mouth daily.       . cholecalciferol (VITAMIN D) 400 UNITS TABS Take 400 Units by mouth daily.       . citalopram (CELEXA) 10 MG tablet Take  10 mg by mouth daily.       Marland Kitchen MAGNESIUM PO Take 50 mg by mouth 3 (three) times daily.       . nabumetone (RELAFEN) 500 MG tablet Take 500 mg by mouth daily.       . NON FORMULARY Patient uses a C PAP machine      . omeprazole (PRILOSEC) 20 MG capsule Take 20 mg by mouth daily.       . pantoprazole (PROTONIX) 40 MG tablet Take 40 mg by mouth daily.       . Potassium Chloride Crys CR (KLOR-CON M20 PO) Take 1 tablet by mouth 2 (two) times daily.       Marland Kitchen QVAR 80 MCG/ACT inhaler Inhale 1 puff into the lungs 2 (two) times daily.       . tamoxifen (NOLVADEX) 20 MG tablet Take 20 mg by mouth daily.      . traZODone (DESYREL) 50 MG tablet Take 50 mg by mouth at bedtime.       . triamterene-hydrochlorothiazide (MAXZIDE) 75-50 MG per tablet Take 1 tablet by mouth 2 (two) times daily.       Marland Kitchen warfarin (COUMADIN) 5 MG tablet Take 2.5-5 mg by mouth daily. 5mg  alternating with 2.5mg        No results found for this or any  previous visit (from the past 48 hour(s)). No results found.  Review of Systems  Constitutional: Negative.   HENT: Negative.   Eyes: Negative.   Respiratory: Negative.   Cardiovascular: Negative.   Gastrointestinal: Negative.   Genitourinary: Negative.   Musculoskeletal: Positive for joint pain.       Bilateral knee   Right worse than left  Skin: Negative.   Neurological: Negative.   Endo/Heme/Allergies: Negative.   Psychiatric/Behavioral: Negative.     Blood pressure 111/69, pulse 75, temperature 98.4 F (36.9 C), height 5\' 7"  (1.702 m), weight 101.606 kg (224 lb), SpO2 98.00%. Physical Exam  Constitutional: She is oriented to person, place, and time. She appears well-developed and well-nourished.  HENT:  Head: Normocephalic and atraumatic.  Mouth/Throat: Oropharynx is clear and moist.  Eyes: Conjunctivae and EOM are normal. Pupils are equal, round, and reactive to light.  Neck: Neck supple.  Cardiovascular: Normal rate and regular rhythm.   Murmur heard. Respiratory: Effort normal and breath sounds normal.  GI: Soft. Bowel sounds are normal.  Genitourinary:       Not pertinent to current symptomatology therefore not examined.  Musculoskeletal: She exhibits edema and tenderness.       Examination of both knees reveals 1 to 2+ crepitation 1+ synovitis diffuse pain range of motion 0-120 degrees knees are stable with normal patella tracking. Valgus deformity.  Antalgic gait.  Walks with a cane. Vascular exam reveals mild venous stasis disease in the lower extremities. Pulses are 2+ and symmetric.  Neurological: She is alert and oriented to person, place, and time. She has normal reflexes.  Skin: Skin is warm and dry.  Psychiatric: She has a normal mood and affect. Her behavior is normal. Judgment and thought content normal.     Assessment Patient Active Problem List  Diagnoses  . MITRAL REGURGITATION  . Breast cancer  . GERD (gastroesophageal reflux disease)  . Arthritis    . Sleep apnea  . Hypertension  . Hx of blood clots  . Cancer  . Fibromyalgia  . Anxiety  . Right knee DJD  . Left knee DJD   Xray: AP,LATERAL, FLEXION, and SUNRISE views show significant  joint space narrowing, with periarticular osteophytes, and subchondral sclerosis. On 10/27/2011   Plan Right total knee replacement.  The risks, benefits, and possible complications of the procedure were discussed in detail with the patient.  The patient is without question.  Latavius Capizzi J 12/08/2011, 3:05 PM

## 2011-12-08 NOTE — Patient Instructions (Signed)
Continue regular self exams Continue tamoxifen 

## 2011-12-09 ENCOUNTER — Ambulatory Visit (INDEPENDENT_AMBULATORY_CARE_PROVIDER_SITE_OTHER): Payer: Medicare Other | Admitting: General Surgery

## 2011-12-09 ENCOUNTER — Ambulatory Visit (HOSPITAL_COMMUNITY)
Admission: RE | Admit: 2011-12-09 | Discharge: 2011-12-09 | Disposition: A | Discharge status: Home / Self Care | Payer: Medicare Other | Source: Ambulatory Visit | Attending: Orthopedic Surgery | Admitting: Orthopedic Surgery

## 2011-12-09 ENCOUNTER — Encounter (HOSPITAL_COMMUNITY)
Admission: RE | Admit: 2011-12-09 | Discharge: 2011-12-09 | Disposition: A | Discharge status: Home / Self Care | Payer: Medicare Other | Source: Ambulatory Visit | Attending: Orthopedic Surgery | Admitting: Orthopedic Surgery

## 2011-12-09 ENCOUNTER — Ambulatory Visit (HOSPITAL_COMMUNITY): Admission: RE | Admit: 2011-12-09 | Payer: Medicare Other | Source: Ambulatory Visit

## 2011-12-09 DIAGNOSIS — Z01812 Encounter for preprocedural laboratory examination: Secondary | ICD-10-CM | POA: Insufficient documentation

## 2011-12-09 DIAGNOSIS — Z01818 Encounter for other preprocedural examination: Secondary | ICD-10-CM | POA: Insufficient documentation

## 2011-12-09 LAB — COMPREHENSIVE METABOLIC PANEL
ALT: 17 U/L (ref 0–35)
AST: 23 U/L (ref 0–37)
Albumin: 3.3 g/dL — ABNORMAL LOW (ref 3.5–5.2)
Alkaline Phosphatase: 57 U/L (ref 39–117)
Glucose, Bld: 82 mg/dL (ref 70–99)
Potassium: 3.9 mEq/L (ref 3.5–5.1)
Sodium: 141 mEq/L (ref 135–145)
Total Protein: 6.7 g/dL (ref 6.0–8.3)

## 2011-12-09 LAB — TYPE AND SCREEN

## 2011-12-09 LAB — URINALYSIS, ROUTINE W REFLEX MICROSCOPIC
Leukocytes, UA: NEGATIVE
Nitrite: NEGATIVE
Specific Gravity, Urine: 1.02 (ref 1.005–1.030)
Urobilinogen, UA: 0.2 mg/dL (ref 0.0–1.0)
pH: 5.5 (ref 5.0–8.0)

## 2011-12-09 LAB — CBC
HCT: 38.2 % (ref 36.0–46.0)
MCV: 96.5 fL (ref 78.0–100.0)
Platelets: 203 10*3/uL (ref 150–400)
RBC: 3.96 MIL/uL (ref 3.87–5.11)
RDW: 13.6 % (ref 11.5–15.5)
WBC: 7.4 10*3/uL (ref 4.0–10.5)

## 2011-12-09 LAB — SURGICAL PCR SCREEN: Staphylococcus aureus: NEGATIVE

## 2011-12-09 LAB — ABO/RH: ABO/RH(D): A NEG

## 2011-12-09 LAB — DIFFERENTIAL
Eosinophils Absolute: 0.2 10*3/uL (ref 0.0–0.7)
Eosinophils Relative: 3 % (ref 0–5)
Lymphs Abs: 1.6 10*3/uL (ref 0.7–4.0)
Monocytes Absolute: 0.5 10*3/uL (ref 0.1–1.0)
Monocytes Relative: 7 % (ref 3–12)

## 2011-12-09 LAB — APTT: aPTT: 39 seconds — ABNORMAL HIGH (ref 24–37)

## 2011-12-09 MED ORDER — CHLORHEXIDINE GLUCONATE 4 % EX LIQD: 60.0000 mL | Freq: Once | CUTANEOUS | Status: DC

## 2011-12-09 NOTE — Pre-Procedure Instructions (Signed)
20 Lydia Campbell  12/09/2011   Your procedure is scheduled on:  December 21, 2011  Report to [facility] at 8:15 AM.  Call this number if you have problems the morning of surgery: [phone]   Remember:   Do not eat food:After Midnight.  May have clear liquids: up to 4 Hours before arrival.  Clear liquids include soda, tea, black coffee, apple or grape juice, broth.  Take these medicines the morning of surgery with A SIP OF WATER: prilosec, protonix, zyrtec   Do not wear jewelry, make-up or nail polish.  Do not wear lotions, powders, or perfumes. You may wear deodorant.  Do not shave 48 hours prior to surgery. Men may shave face and neck.  Do not bring valuables to the hospital.  Contacts, dentures or bridgework may not be worn into surgery.  Leave suitcase in the car. After surgery it may be brought to your room.  For patients admitted to the hospital, checkout time is 11:00 AM the day of discharge.   Patients discharged the day of surgery will not be allowed to drive home.  Name and phone number of your driver: na  Special Instructions: Incentive Spirometry - Practice and bring it with you on the day of surgery. and CHG Shower Use Special Wash: 1/2 bottle night before surgery and 1/2 bottle morning of surgery.   Please read over the following fact sheets that you were given: Pain Booklet, Blood Transfusion Information, Total Joint Packet and Surgical Site Infection Prevention

## 2011-12-10 LAB — URINE CULTURE: Culture  Setup Time: 201305221316

## 2011-12-10 NOTE — Consult Note (Addendum)
Anesthesia Chart Review:  Patient is a 71 year old female scheduled for right TKR on 12/21/11.  History includes DVT and bilateral LL PE in August 2012 presumed secondary to Tamoxifen treatment for breast CA (s/p left radical mastectomy), OSA, murmur, GERD, HH, asthma, HTN, fibromyalgia, anxiety, seizures (from PCN), blood transfusion.  PCP is Dr. Caryl Asp.  Pulmonologist is Dr. Genoveva Ill in [place]. Hematologist is Dr. Donnie Coffin.  She has been evaluated by Cardiologist Dr. Diona Browner in the past for mild MR, and was seen by Joni Reining, NP on 11/05/11 for pre-operative evaluation.  She was ultimately cleared without additional cardiac testing.  Lovenox bridging was recommended.  EKG on 12/09/11 showed NSR.  Echo on 03/05/09 showed: 1. Left ventricle: The cavity size was normal. Systolic function was normal. The estimated ejection fraction was in the range of 60% to 65%. Wall motion was normal; there were no regional wall motion abnormalities. 2. Mitral valve: Mild regurgitation.  Labs noted.  Cr 0.88, PT/INR elevated at 28/2.57. PTT 39.  Will repeat PT/PTT on arrival.  CXR on 12/09/11 showed no acute infiltrate or pulmonary edema. Again noted left basilar atelectasis or scarring.  Spirometry was WNL, lung volumes WNL, mild decrease in diffusing capacity on 05/04/11 [facility]). She had no oxygen desaturation on a six-minute walk test on 05/05/11.  Plan to proceed if follow-up labs reasonable.  Shonna Chock, PA-C

## 2011-12-15 ENCOUNTER — Encounter (INDEPENDENT_AMBULATORY_CARE_PROVIDER_SITE_OTHER): Payer: Self-pay | Admitting: General Surgery

## 2011-12-15 NOTE — Progress Notes (Signed)
Subjective:     Patient ID: Lydia Campbell, female   DOB: 06-Apr-1941, 71 y.o.   MRN: [id number]  HPI The patient is a 71 year old white female who is 3-1/2 years out from a left mastectomy for a T2 N0 left breast cancer. She has done well since her last visit. She continues to take tamoxifen and seems to be tolerating it reasonably well. She states she is also scheduled for knee surgery on June 3. She denies any chest or breast pain  Review of Systems  Constitutional: Negative.   HENT: Negative.   Eyes: Negative.   Respiratory: Negative.   Cardiovascular: Negative.   Gastrointestinal: Negative.   Genitourinary: Negative.   Musculoskeletal: Negative.   Skin: Negative.   Neurological: Negative.   Hematological: Negative.   Psychiatric/Behavioral: Negative.        Objective:   Physical Exam  Constitutional: She is oriented to person, place, and time. She appears well-developed and well-nourished.  HENT:  Head: Normocephalic and atraumatic.  Eyes: Conjunctivae and EOM are normal. Pupils are equal, round, and reactive to light.  Neck: Normal range of motion. Neck supple.  Cardiovascular: Normal rate, regular rhythm and normal heart sounds.   Pulmonary/Chest: Effort normal and breath sounds normal.       There is no palpable mass the left chest wall. No palpable mass in the right breast. No palpable axillary supraclavicular or cervical lymphadenopathy.  Abdominal: Soft. Bowel sounds are normal. She exhibits no mass. There is no tenderness.  Musculoskeletal: Normal range of motion.  Lymphadenopathy:    She has no cervical adenopathy.  Neurological: She is alert and oriented to person, place, and time.  Skin: Skin is warm and dry.  Psychiatric: She has a normal mood and affect. Her behavior is normal.       Assessment:     3-1/2 years status post left mastectomy    Plan:     Overall she is doing very well. She will continue her tamoxifen. We will plan to see her back in  about 6 months. She will continue to do regular self exams.

## 2011-12-20 MED ORDER — VANCOMYCIN HCL 1000 MG IV SOLR
1500.0000 mg | INTRAVENOUS | Status: DC
  Filled 2011-12-20: qty 1500

## 2011-12-21 ENCOUNTER — Encounter (HOSPITAL_COMMUNITY): Payer: Self-pay | Admitting: *Deleted

## 2011-12-21 ENCOUNTER — Ambulatory Visit (HOSPITAL_COMMUNITY): Payer: Medicare Other | Admitting: Vascular Surgery

## 2011-12-21 ENCOUNTER — Encounter (HOSPITAL_COMMUNITY): Payer: Self-pay | Admitting: Vascular Surgery

## 2011-12-21 ENCOUNTER — Encounter (HOSPITAL_COMMUNITY)
Admission: RE | Disposition: A | Discharge status: Home Health | Payer: Self-pay | Source: Ambulatory Visit | Attending: Orthopedic Surgery

## 2011-12-21 ENCOUNTER — Inpatient Hospital Stay (HOSPITAL_COMMUNITY)
Admission: RE | Admit: 2011-12-21 | Discharge: 2011-12-23 | DRG: 470 | Disposition: A | Discharge status: Home Health | Payer: Medicare Other | Source: Ambulatory Visit | Attending: Orthopedic Surgery | Admitting: Orthopedic Surgery

## 2011-12-21 DIAGNOSIS — M1710 Unilateral primary osteoarthritis, unspecified knee: Principal | ICD-10-CM | POA: Diagnosis present

## 2011-12-21 DIAGNOSIS — E876 Hypokalemia: Secondary | ICD-10-CM | POA: Diagnosis not present

## 2011-12-21 DIAGNOSIS — M1711 Unilateral primary osteoarthritis, right knee: Secondary | ICD-10-CM

## 2011-12-21 DIAGNOSIS — I10 Essential (primary) hypertension: Secondary | ICD-10-CM

## 2011-12-21 DIAGNOSIS — D500 Iron deficiency anemia secondary to blood loss (chronic): Secondary | ICD-10-CM

## 2011-12-21 DIAGNOSIS — D62 Acute posthemorrhagic anemia: Secondary | ICD-10-CM | POA: Diagnosis not present

## 2011-12-21 DIAGNOSIS — G4730 Sleep apnea, unspecified: Secondary | ICD-10-CM | POA: Diagnosis present

## 2011-12-21 DIAGNOSIS — IMO0001 Reserved for inherently not codable concepts without codable children: Secondary | ICD-10-CM | POA: Diagnosis present

## 2011-12-21 DIAGNOSIS — M797 Fibromyalgia: Secondary | ICD-10-CM

## 2011-12-21 DIAGNOSIS — C801 Malignant (primary) neoplasm, unspecified: Secondary | ICD-10-CM

## 2011-12-21 DIAGNOSIS — F419 Anxiety disorder, unspecified: Secondary | ICD-10-CM | POA: Diagnosis present

## 2011-12-21 DIAGNOSIS — M1990 Unspecified osteoarthritis, unspecified site: Secondary | ICD-10-CM | POA: Diagnosis present

## 2011-12-21 DIAGNOSIS — C50919 Malignant neoplasm of unspecified site of unspecified female breast: Secondary | ICD-10-CM | POA: Diagnosis present

## 2011-12-21 DIAGNOSIS — I080 Rheumatic disorders of both mitral and aortic valves: Secondary | ICD-10-CM

## 2011-12-21 DIAGNOSIS — M1712 Unilateral primary osteoarthritis, left knee: Secondary | ICD-10-CM

## 2011-12-21 DIAGNOSIS — F411 Generalized anxiety disorder: Secondary | ICD-10-CM | POA: Diagnosis present

## 2011-12-21 DIAGNOSIS — Z86718 Personal history of other venous thrombosis and embolism: Secondary | ICD-10-CM

## 2011-12-21 DIAGNOSIS — K219 Gastro-esophageal reflux disease without esophagitis: Secondary | ICD-10-CM

## 2011-12-21 DIAGNOSIS — Z7901 Long term (current) use of anticoagulants: Secondary | ICD-10-CM

## 2011-12-21 DIAGNOSIS — Z86711 Personal history of pulmonary embolism: Secondary | ICD-10-CM

## 2011-12-21 DIAGNOSIS — IMO0002 Reserved for concepts with insufficient information to code with codable children: Principal | ICD-10-CM | POA: Diagnosis present

## 2011-12-21 HISTORY — PX: TOTAL KNEE ARTHROPLASTY: SHX125

## 2011-12-21 LAB — PROTIME-INR
INR: 0.97 (ref 0.00–1.49)
Prothrombin Time: 13.1 seconds (ref 11.6–15.2)

## 2011-12-21 SURGERY — ARTHROPLASTY, KNEE, TOTAL
Anesthesia: General | Site: Knee | Laterality: Right | Wound class: Clean

## 2011-12-21 MED ORDER — ACETAMINOPHEN 650 MG RE SUPP: 650.0000 mg | Freq: Four times a day (QID) | RECTAL | Status: DC | PRN

## 2011-12-21 MED ORDER — CHOLECALCIFEROL 10 MCG (400 UNIT) PO TABS
400.0000 [IU] | ORAL_TABLET | Freq: Every day | ORAL | Status: DC
  Administered 2011-12-21 – 2011-12-23 (×3): 400 [IU] via ORAL
  Filled 2011-12-21 (×3): qty 1

## 2011-12-21 MED ORDER — LACTATED RINGERS IV SOLN
INTRAVENOUS | Status: DC
  Administered 2011-12-21: 10:00:00 via INTRAVENOUS

## 2011-12-21 MED ORDER — BISACODYL 5 MG PO TBEC
10.0000 mg | DELAYED_RELEASE_TABLET | Freq: Every day | ORAL | Status: DC
  Administered 2011-12-21 – 2011-12-22 (×2): 10 mg via ORAL
  Filled 2011-12-21 (×2): qty 2

## 2011-12-21 MED ORDER — MENTHOL 3 MG MT LOZG: 1.0000 | LOZENGE | OROMUCOSAL | Status: DC | PRN

## 2011-12-21 MED ORDER — ONDANSETRON HCL 4 MG/2ML IJ SOLN
INTRAMUSCULAR | Status: DC | PRN
  Administered 2011-12-21: 4 mg via INTRAVENOUS

## 2011-12-21 MED ORDER — HYDROMORPHONE HCL PF 1 MG/ML IJ SOLN
INTRAMUSCULAR | Status: AC
  Filled 2011-12-21: qty 1

## 2011-12-21 MED ORDER — POVIDONE-IODINE 7.5 % EX SOLN
Freq: Once | CUTANEOUS | Status: DC
  Filled 2011-12-21: qty 118

## 2011-12-21 MED ORDER — PHENOL 1.4 % MT LIQD: 1.0000 | OROMUCOSAL | Status: DC | PRN

## 2011-12-21 MED ORDER — SODIUM CHLORIDE 0.9 % IV SOLN
INTRAVENOUS | Status: DC | PRN
  Administered 2011-12-21 (×2): via INTRAVENOUS

## 2011-12-21 MED ORDER — OXYCODONE HCL 5 MG PO TABS
5.0000 mg | ORAL_TABLET | ORAL | Status: DC | PRN
  Administered 2011-12-21: 5 mg via ORAL
  Administered 2011-12-22 (×2): 10 mg via ORAL
  Filled 2011-12-21: qty 2
  Filled 2011-12-21: qty 1
  Filled 2011-12-21: qty 2

## 2011-12-21 MED ORDER — ALLOPURINOL 300 MG PO TABS
300.0000 mg | ORAL_TABLET | Freq: Every day | ORAL | Status: DC
  Administered 2011-12-21 – 2011-12-23 (×3): 300 mg via ORAL
  Filled 2011-12-21 (×3): qty 1

## 2011-12-21 MED ORDER — PROPOFOL 10 MG/ML IV EMUL
INTRAVENOUS | Status: DC | PRN
  Administered 2011-12-21: 120 ug/kg/min via INTRAVENOUS

## 2011-12-21 MED ORDER — MAGNESIUM GLUCONATE 500 MG PO TABS
500.0000 mg | ORAL_TABLET | Freq: Three times a day (TID) | ORAL | Status: DC
  Administered 2011-12-21 – 2011-12-23 (×5): 500 mg via ORAL
  Filled 2011-12-21 (×7): qty 1

## 2011-12-21 MED ORDER — PROPOFOL 10 MG/ML IV EMUL
INTRAVENOUS | Status: DC | PRN
  Administered 2011-12-21: 200 mL via INTRAVENOUS
  Administered 2011-12-21: 100 mL via INTRAVENOUS

## 2011-12-21 MED ORDER — FENTANYL CITRATE 0.05 MG/ML IJ SOLN
INTRAMUSCULAR | Status: DC | PRN
  Administered 2011-12-21: 50 ug via INTRAVENOUS
  Administered 2011-12-21: 100 ug via INTRAVENOUS
  Administered 2011-12-21: 50 ug via INTRAVENOUS
  Administered 2011-12-21: 100 ug via INTRAVENOUS
  Administered 2011-12-21 (×2): 50 ug via INTRAVENOUS
  Administered 2011-12-21: 100 ug via INTRAVENOUS

## 2011-12-21 MED ORDER — OXYCODONE-ACETAMINOPHEN 5-325 MG PO TABS
1.0000 | ORAL_TABLET | ORAL | Status: DC | PRN
  Administered 2011-12-22 (×2): 2 via ORAL
  Filled 2011-12-21 (×2): qty 2

## 2011-12-21 MED ORDER — LIDOCAINE HCL (CARDIAC) 20 MG/ML IV SOLN
INTRAVENOUS | Status: DC | PRN
  Administered 2011-12-21: 70 mg via INTRAVENOUS

## 2011-12-21 MED ORDER — ONDANSETRON HCL 4 MG/2ML IJ SOLN: 4.0000 mg | Freq: Once | INTRAMUSCULAR | Status: DC | PRN

## 2011-12-21 MED ORDER — DIAZEPAM 5 MG PO TABS: 5.0000 mg | ORAL_TABLET | Freq: Four times a day (QID) | ORAL | Status: DC | PRN

## 2011-12-21 MED ORDER — VITAMIN C 500 MG PO TABS: 1000.0000 mg | ORAL_TABLET | Freq: Every day | ORAL | Status: DC

## 2011-12-21 MED ORDER — VITAMIN C 500 MG PO TABS
1000.0000 mg | ORAL_TABLET | Freq: Every day | ORAL | Status: DC
  Administered 2011-12-21 – 2011-12-23 (×3): 1000 mg via ORAL
  Filled 2011-12-21 (×3): qty 2

## 2011-12-21 MED ORDER — TRAZODONE HCL 50 MG PO TABS
50.0000 mg | ORAL_TABLET | Freq: Every day | ORAL | Status: DC
  Administered 2011-12-21 – 2011-12-22 (×2): 50 mg via ORAL
  Filled 2011-12-21 (×3): qty 1

## 2011-12-21 MED ORDER — ENOXAPARIN SODIUM 30 MG/0.3ML ~~LOC~~ SOLN
30.0000 mg | Freq: Two times a day (BID) | SUBCUTANEOUS | Status: DC
  Administered 2011-12-22 – 2011-12-23 (×3): 30 mg via SUBCUTANEOUS
  Filled 2011-12-21 (×5): qty 0.3

## 2011-12-21 MED ORDER — LORATADINE 10 MG PO TABS
10.0000 mg | ORAL_TABLET | Freq: Every day | ORAL | Status: DC
  Administered 2011-12-21 – 2011-12-23 (×3): 10 mg via ORAL
  Filled 2011-12-21 (×3): qty 1

## 2011-12-21 MED ORDER — METOCLOPRAMIDE HCL 5 MG/ML IJ SOLN: 5.0000 mg | Freq: Three times a day (TID) | INTRAMUSCULAR | Status: DC | PRN

## 2011-12-21 MED ORDER — ONDANSETRON HCL 4 MG/2ML IJ SOLN
4.0000 mg | Freq: Four times a day (QID) | INTRAMUSCULAR | Status: DC | PRN
  Administered 2011-12-22: 4 mg via INTRAVENOUS
  Filled 2011-12-21: qty 2

## 2011-12-21 MED ORDER — VANCOMYCIN HCL 1000 MG IV SOLR
1500.0000 mg | INTRAVENOUS | Status: AC
  Administered 2011-12-21: 1500 mg via INTRAVENOUS
  Filled 2011-12-21: qty 1500

## 2011-12-21 MED ORDER — ONDANSETRON HCL 4 MG PO TABS: 4.0000 mg | ORAL_TABLET | Freq: Four times a day (QID) | ORAL | Status: DC | PRN

## 2011-12-21 MED ORDER — SODIUM CHLORIDE 0.9 % IR SOLN
Status: DC | PRN
  Administered 2011-12-21: 3000 mL

## 2011-12-21 MED ORDER — DEXAMETHASONE SODIUM PHOSPHATE 10 MG/ML IJ SOLN
INTRAMUSCULAR | Status: DC | PRN
  Administered 2011-12-21: 10 mg via INTRAVENOUS

## 2011-12-21 MED ORDER — POTASSIUM CHLORIDE CRYS ER 20 MEQ PO TBCR
20.0000 meq | EXTENDED_RELEASE_TABLET | Freq: Every day | ORAL | Status: DC
  Administered 2011-12-21: 20 meq via ORAL
  Filled 2011-12-21 (×2): qty 1

## 2011-12-21 MED ORDER — TOBRAMYCIN SULFATE 1.2 G IJ SOLR
INTRAMUSCULAR | Status: DC | PRN
  Administered 2011-12-21: 1.2 g

## 2011-12-21 MED ORDER — TRIAMTERENE-HCTZ 75-50 MG PO TABS
1.0000 | ORAL_TABLET | Freq: Two times a day (BID) | ORAL | Status: DC
  Administered 2011-12-21 – 2011-12-23 (×4): 1 via ORAL
  Filled 2011-12-21 (×6): qty 1

## 2011-12-21 MED ORDER — MAGNESIUM 65 MG PO TABS: 65.0000 mg | ORAL_TABLET | Freq: Three times a day (TID) | ORAL | Status: DC

## 2011-12-21 MED ORDER — POTASSIUM CHLORIDE IN NACL 20-0.9 MEQ/L-% IV SOLN
INTRAVENOUS | Status: DC
  Administered 2011-12-22: 08:00:00 via INTRAVENOUS
  Filled 2011-12-21 (×7): qty 1000

## 2011-12-21 MED ORDER — DOCUSATE SODIUM 100 MG PO CAPS
100.0000 mg | ORAL_CAPSULE | Freq: Two times a day (BID) | ORAL | Status: DC
  Administered 2011-12-21 – 2011-12-23 (×4): 100 mg via ORAL
  Filled 2011-12-21 (×4): qty 1

## 2011-12-21 MED ORDER — MIDAZOLAM HCL 5 MG/5ML IJ SOLN
INTRAMUSCULAR | Status: DC | PRN
  Administered 2011-12-21: 2 mg via INTRAVENOUS

## 2011-12-21 MED ORDER — FLUTICASONE PROPIONATE HFA 44 MCG/ACT IN AERO
2.0000 | INHALATION_SPRAY | Freq: Two times a day (BID) | RESPIRATORY_TRACT | Status: DC
  Administered 2011-12-21 – 2011-12-23 (×4): 2 via RESPIRATORY_TRACT
  Filled 2011-12-21: qty 10.6

## 2011-12-21 MED ORDER — PANTOPRAZOLE SODIUM 40 MG PO TBEC
80.0000 mg | DELAYED_RELEASE_TABLET | Freq: Every day | ORAL | Status: DC
  Administered 2011-12-21 – 2011-12-22 (×2): 80 mg via ORAL
  Filled 2011-12-21 (×2): qty 1
  Filled 2011-12-21: qty 2

## 2011-12-21 MED ORDER — BUPIVACAINE-EPINEPHRINE 0.25% -1:200000 IJ SOLN
INTRAMUSCULAR | Status: DC | PRN
  Administered 2011-12-21: 30 mL

## 2011-12-21 MED ORDER — ACETAMINOPHEN 325 MG PO TABS: 650.0000 mg | ORAL_TABLET | Freq: Four times a day (QID) | ORAL | Status: DC | PRN

## 2011-12-21 MED ORDER — HYDROMORPHONE HCL PF 1 MG/ML IJ SOLN
0.2500 mg | INTRAMUSCULAR | Status: DC | PRN
  Administered 2011-12-21 (×4): 0.5 mg via INTRAVENOUS

## 2011-12-21 MED ORDER — DROPERIDOL 2.5 MG/ML IJ SOLN
INTRAMUSCULAR | Status: DC | PRN
  Administered 2011-12-21: 1.25 mg via INTRAVENOUS

## 2011-12-21 MED ORDER — ACETAMINOPHEN 10 MG/ML IV SOLN
INTRAVENOUS | Status: AC
  Filled 2011-12-21: qty 100

## 2011-12-21 MED ORDER — ACETAMINOPHEN 10 MG/ML IV SOLN
INTRAVENOUS | Status: DC | PRN
  Administered 2011-12-21: 1000 mg via INTRAVENOUS

## 2011-12-21 MED ORDER — BUPIVACAINE-EPINEPHRINE PF 0.5-1:200000 % IJ SOLN
INTRAMUSCULAR | Status: DC | PRN
  Administered 2011-12-21: 30 mL

## 2011-12-21 MED ORDER — CALCIUM CARBONATE 1250 (500 CA) MG PO TABS
1.0000 | ORAL_TABLET | Freq: Every day | ORAL | Status: DC
  Administered 2011-12-21 – 2011-12-23 (×3): 500 mg via ORAL
  Filled 2011-12-21 (×3): qty 1

## 2011-12-21 MED ORDER — CITALOPRAM HYDROBROMIDE 10 MG PO TABS
10.0000 mg | ORAL_TABLET | Freq: Every day | ORAL | Status: DC
  Administered 2011-12-21 – 2011-12-23 (×3): 10 mg via ORAL
  Filled 2011-12-21 (×3): qty 1

## 2011-12-21 MED ORDER — LACTATED RINGERS IV SOLN
INTRAVENOUS | Status: DC | PRN
  Administered 2011-12-21 (×2): via INTRAVENOUS

## 2011-12-21 MED ORDER — METOCLOPRAMIDE HCL 10 MG PO TABS: 5.0000 mg | ORAL_TABLET | Freq: Three times a day (TID) | ORAL | Status: DC | PRN

## 2011-12-21 MED ORDER — HYDROMORPHONE HCL PF 1 MG/ML IJ SOLN
0.5000 mg | INTRAMUSCULAR | Status: DC | PRN
  Administered 2011-12-21: 1 mg via INTRAVENOUS
  Administered 2011-12-22: 0.5 mg via INTRAVENOUS
  Administered 2011-12-22: 1 mg via INTRAVENOUS
  Administered 2011-12-23: 0.5 mg via INTRAVENOUS
  Administered 2011-12-23: 1 mg via INTRAVENOUS
  Administered 2011-12-23: 0.5 mg via INTRAVENOUS
  Filled 2011-12-21 (×6): qty 1

## 2011-12-21 SURGICAL SUPPLY — 69 items
BANDAGE ELASTIC 6 VELCRO ST LF (GAUZE/BANDAGES/DRESSINGS) ×2 IMPLANT
BANDAGE ESMARK 6X9 LF (GAUZE/BANDAGES/DRESSINGS) ×1 IMPLANT
BLADE SAGITTAL 25.0X1.19X90 (BLADE) ×2 IMPLANT
BLADE SAW SGTL 11.0X1.19X90.0M (BLADE) IMPLANT
BLADE SAW SGTL 13.0X1.19X90.0M (BLADE) ×2 IMPLANT
BLADE SURG 10 STRL SS (BLADE) ×4 IMPLANT
BNDG CMPR 9X6 STRL LF SNTH (GAUZE/BANDAGES/DRESSINGS) ×1
BNDG CMPR MED 15X6 ELC VLCR LF (GAUZE/BANDAGES/DRESSINGS) ×1
BNDG ELASTIC 6X15 VLCR STRL LF (GAUZE/BANDAGES/DRESSINGS) ×2 IMPLANT
BNDG ESMARK 6X9 LF (GAUZE/BANDAGES/DRESSINGS) ×2
BOWL SMART MIX CTS (DISPOSABLE) ×2 IMPLANT
CEMENT HV SMART SET (Cement) ×4 IMPLANT
CLOTH BEACON ORANGE TIMEOUT ST (SAFETY) ×2 IMPLANT
COVER BACK TABLE 24X17X13 BIG (DRAPES) IMPLANT
COVER PROBE W GEL 5X96 (DRAPES) ×2 IMPLANT
COVER SURGICAL LIGHT HANDLE (MISCELLANEOUS) ×2 IMPLANT
CUFF TOURNIQUET SINGLE 34IN LL (TOURNIQUET CUFF) ×2 IMPLANT
CUFF TOURNIQUET SINGLE 44IN (TOURNIQUET CUFF) IMPLANT
DRAPE EXTREMITY T 121X128X90 (DRAPE) ×2 IMPLANT
DRAPE INCISE IOBAN 66X45 STRL (DRAPES) ×2 IMPLANT
DRAPE PROXIMA HALF (DRAPES) ×2 IMPLANT
DRAPE U-SHAPE 47X51 STRL (DRAPES) ×2 IMPLANT
DRSG ADAPTIC 3X8 NADH LF (GAUZE/BANDAGES/DRESSINGS) ×2 IMPLANT
DRSG PAD ABDOMINAL 8X10 ST (GAUZE/BANDAGES/DRESSINGS) ×2 IMPLANT
DURAPREP 26ML APPLICATOR (WOUND CARE) ×2 IMPLANT
ELECT CAUTERY BLADE 6.4 (BLADE) ×2 IMPLANT
ELECT REM PT RETURN 9FT ADLT (ELECTROSURGICAL) ×2
ELECTRODE REM PT RTRN 9FT ADLT (ELECTROSURGICAL) ×1 IMPLANT
EVACUATOR 1/8 PVC DRAIN (DRAIN) ×2 IMPLANT
FACESHIELD LNG OPTICON STERILE (SAFETY) ×2 IMPLANT
GLOVE BIO SURGEON STRL SZ7 (GLOVE) ×2 IMPLANT
GLOVE BIOGEL PI IND STRL 7.0 (GLOVE) ×1 IMPLANT
GLOVE BIOGEL PI IND STRL 7.5 (GLOVE) ×1 IMPLANT
GLOVE BIOGEL PI INDICATOR 7.0 (GLOVE) ×1
GLOVE BIOGEL PI INDICATOR 7.5 (GLOVE) ×1
GLOVE SS BIOGEL STRL SZ 7.5 (GLOVE) ×1 IMPLANT
GLOVE SUPERSENSE BIOGEL SZ 7.5 (GLOVE) ×1
GOWN PREVENTION PLUS XLARGE (GOWN DISPOSABLE) ×4 IMPLANT
GOWN STRL NON-REIN LRG LVL3 (GOWN DISPOSABLE) ×4 IMPLANT
HANDPIECE INTERPULSE COAX TIP (DISPOSABLE) ×2
HOOD PEEL AWAY FACE SHEILD DIS (HOOD) ×4 IMPLANT
IMMOBILIZER KNEE 22 UNIV (SOFTGOODS) ×4 IMPLANT
INSERT CUSHION PRONEVIEW LG (MISCELLANEOUS) ×2 IMPLANT
KIT BASIN OR (CUSTOM PROCEDURE TRAY) ×2 IMPLANT
KIT ROOM TURNOVER OR (KITS) ×2 IMPLANT
MANIFOLD NEPTUNE II (INSTRUMENTS) ×2 IMPLANT
NS IRRIG 1000ML POUR BTL (IV SOLUTION) ×2 IMPLANT
PACK TOTAL JOINT (CUSTOM PROCEDURE TRAY) ×2 IMPLANT
PAD ARMBOARD 7.5X6 YLW CONV (MISCELLANEOUS) ×4 IMPLANT
PAD CAST 4YDX4 CTTN HI CHSV (CAST SUPPLIES) ×1 IMPLANT
PADDING CAST COTTON 4X4 STRL (CAST SUPPLIES) ×2
PADDING CAST COTTON 6X4 STRL (CAST SUPPLIES) ×2 IMPLANT
POSITIONER HEAD PRONE TRACH (MISCELLANEOUS) ×2 IMPLANT
RUBBERBAND STERILE (MISCELLANEOUS) ×2 IMPLANT
SET HNDPC FAN SPRY TIP SCT (DISPOSABLE) ×1 IMPLANT
SPONGE GAUZE 4X4 12PLY (GAUZE/BANDAGES/DRESSINGS) ×2 IMPLANT
STRIP CLOSURE SKIN 1/2X4 (GAUZE/BANDAGES/DRESSINGS) ×2 IMPLANT
SUCTION FRAZIER TIP 10 FR DISP (SUCTIONS) ×2 IMPLANT
SUT MNCRL AB 3-0 PS2 18 (SUTURE) ×2 IMPLANT
SUT VIC AB 0 CT1 27 (SUTURE) ×4
SUT VIC AB 0 CT1 27XBRD ANBCTR (SUTURE) ×2 IMPLANT
SUT VIC AB 2-0 CT1 27 (SUTURE) ×4
SUT VIC AB 2-0 CT1 TAPERPNT 27 (SUTURE) ×2 IMPLANT
SUT VLOC 180 0 24IN GS25 (SUTURE) ×2 IMPLANT
SYR 30ML SLIP (SYRINGE) ×2 IMPLANT
TOWEL OR 17X24 6PK STRL BLUE (TOWEL DISPOSABLE) ×2 IMPLANT
TOWEL OR 17X26 10 PK STRL BLUE (TOWEL DISPOSABLE) ×2 IMPLANT
TRAY FOLEY CATH 14FR (SET/KITS/TRAYS/PACK) ×2 IMPLANT
WATER STERILE IRR 1000ML POUR (IV SOLUTION) ×6 IMPLANT

## 2011-12-21 NOTE — Progress Notes (Signed)
UR COMPLETED  

## 2011-12-21 NOTE — H&P (View-Only) (Signed)
Lydia Campbell is an 71 y.o. female.   Chief Complaint: bilateral knee DJD right worse than left HPI: Lydia Campbell is a 71 year old seen at the request of Dr. Voytek for significant bilateral knee degenerative joint disease that's been long-standing getting progressively worse. Significant problems with weightbearing with pain and significant gait abnormality. The excruciating pain is getting progressively worse, limiting activities of daily living, poses a significant fall risk, and has failed multiple conservative treatments including intraarticular cortisone injections, intraarticular Supartz, bracing, medication and home physical therapy.   Of note is that she has a history of DVT and PE nine months ago which is presumed to be secondary to Tamoxifen treatment for breast cancer. She's under the care of Dr. Peter Rubin for breast cancer, she had surgery in 2009. She's seen [facility] in the past when she was on chemotherapy but hasn't seen them in the past two years. She also had a problem with O2 dependency when she did have her PE and was under the care of Dr. James Henderson of pulmonology in [place].   Past Medical History  Diagnosis Date  . GERD (gastroesophageal reflux disease)   . Arthritis   . Asthma   . Blood transfusion   . Clotting disorder   . Sleep apnea   . Heart murmur   . Hypertension   . Hx of blood clots 8/12    lungs  . H/O hiatal hernia   . Seizures     from pcn  . Cancer     breast lft  . Fibromyalgia   . Anxiety   . Right knee DJD   . Left knee DJD     Past Surgical History  Procedure Date  . Abdominal hysterectomy   . Tonsillectomy   . Breast surgery   . Knee cartilage surgery 2001 and 2009    bil  . Breast surgery 03-28-08    lft  . Mastectomy     lft    Family History  Problem Relation Age of Onset  . Heart disease Mother   . Diabetes Father   . Heart disease Father   . Heart disease Brother    Social History:  reports that she quit  smoking about 43 years ago. Her smoking use included Cigarettes. She quit after 5 years of use. She has never used smokeless tobacco. She reports that she does not drink alcohol or use illicit drugs.  Allergies:  Allergies  Allergen Reactions  . Morphine And Related Nausea Only    Nausea   . Penicillins Other (See Comments)    seizures    Current Outpatient Prescriptions on File Prior to Visit  Medication Sig Dispense Refill  . allopurinol (ZYLOPRIM) 300 MG tablet Take 300 mg by mouth daily.       . Ascorbic Acid (VITAMIN C) 1000 MG tablet Take 1,000 mg by mouth daily.       . B Complex-Biotin-FA (B-COMPLEX PO) Take 100 mg by mouth at bedtime.       . Biotin 5000 MCG CAPS Take 1 capsule by mouth daily.       . calcium carbonate (CALCIUM 500) 1250 MG tablet Take 1 tablet by mouth daily.      . cetirizine (ZYRTEC) 10 MG tablet Take 10 mg by mouth daily.       . cholecalciferol (VITAMIN D) 400 UNITS TABS Take 400 Units by mouth daily.       . citalopram (CELEXA) 10 MG tablet Take   10 mg by mouth daily.       . MAGNESIUM PO Take 50 mg by mouth 3 (three) times daily.       . nabumetone (RELAFEN) 500 MG tablet Take 500 mg by mouth daily.       . NON FORMULARY Patient uses a C PAP machine      . omeprazole (PRILOSEC) 20 MG capsule Take 20 mg by mouth daily.       . pantoprazole (PROTONIX) 40 MG tablet Take 40 mg by mouth daily.       . Potassium Chloride Crys CR (KLOR-CON M20 PO) Take 1 tablet by mouth 2 (two) times daily.       . QVAR 80 MCG/ACT inhaler Inhale 1 puff into the lungs 2 (two) times daily.       . tamoxifen (NOLVADEX) 20 MG tablet Take 20 mg by mouth daily.      . traZODone (DESYREL) 50 MG tablet Take 50 mg by mouth at bedtime.       . triamterene-hydrochlorothiazide (MAXZIDE) 75-50 MG per tablet Take 1 tablet by mouth 2 (two) times daily.       . warfarin (COUMADIN) 5 MG tablet Take 2.5-5 mg by mouth daily. 5mg alternating with 2.5mg       No results found for this or any  previous visit (from the past 48 hour(s)). No results found.  Review of Systems  Constitutional: Negative.   HENT: Negative.   Eyes: Negative.   Respiratory: Negative.   Cardiovascular: Negative.   Gastrointestinal: Negative.   Genitourinary: Negative.   Musculoskeletal: Positive for joint pain.       Bilateral knee   Right worse than left  Skin: Negative.   Neurological: Negative.   Endo/Heme/Allergies: Negative.   Psychiatric/Behavioral: Negative.     Blood pressure 111/69, pulse 75, temperature 98.4 F (36.9 C), height 5' 7" (1.702 m), weight 101.606 kg (224 lb), SpO2 98.00%. Physical Exam  Constitutional: She is oriented to person, place, and time. She appears well-developed and well-nourished.  HENT:  Head: Normocephalic and atraumatic.  Mouth/Throat: Oropharynx is clear and moist.  Eyes: Conjunctivae and EOM are normal. Pupils are equal, round, and reactive to light.  Neck: Neck supple.  Cardiovascular: Normal rate and regular rhythm.   Murmur heard. Respiratory: Effort normal and breath sounds normal.  GI: Soft. Bowel sounds are normal.  Genitourinary:       Not pertinent to current symptomatology therefore not examined.  Musculoskeletal: She exhibits edema and tenderness.       Examination of both knees reveals 1 to 2+ crepitation 1+ synovitis diffuse pain range of motion 0-120 degrees knees are stable with normal patella tracking. Valgus deformity.  Antalgic gait.  Walks with a cane. Vascular exam reveals mild venous stasis disease in the lower extremities. Pulses are 2+ and symmetric.  Neurological: She is alert and oriented to person, place, and time. She has normal reflexes.  Skin: Skin is warm and dry.  Psychiatric: She has a normal mood and affect. Her behavior is normal. Judgment and thought content normal.     Assessment Patient Active Problem List  Diagnoses  . MITRAL REGURGITATION  . Breast cancer  . GERD (gastroesophageal reflux disease)  . Arthritis    . Sleep apnea  . Hypertension  . Hx of blood clots  . Cancer  . Fibromyalgia  . Anxiety  . Right knee DJD  . Left knee DJD   Xray: AP,LATERAL, FLEXION, and SUNRISE views show significant   joint space narrowing, with periarticular osteophytes, and subchondral sclerosis. On 10/27/2011   Plan Right total knee replacement.  The risks, benefits, and possible complications of the procedure were discussed in detail with the patient.  The patient is without question.  Sheenah Dimitroff J 12/08/2011, 3:05 PM    

## 2011-12-21 NOTE — Anesthesia Procedure Notes (Addendum)
Anesthesia Regional Block:  Femoral nerve block  Pre-Anesthetic Checklist: ,, timeout performed, Correct Patient, Correct Site, Correct Laterality, Correct Procedure, Correct Position, site marked, Risks and benefits discussed,  Surgical consent,  Pre-op evaluation,  At surgeon's request and post-op pain management  Laterality: Right  Prep: chloraprep       Needles:  Injection technique: Single-shot  Needle Type: Echogenic Stimulator Needle          Additional Needles:  Procedures: ultrasound guided and nerve stimulator Femoral nerve block  Nerve Stimulator or Paresthesia:  Response: 0.4 mA,   Additional Responses:   Narrative:  Start time: 12/21/2011 9:45 AM End time: 12/21/2011 10:00 AM Injection made incrementally with aspirations every 5 mL.  Performed by: Personally  Anesthesiologist: Arta Bruce md  Additional Notes: Monitors applied. Patient sedated. Sterile prep and drape,hand hygiene and sterile gloves were used. Relevant anatomy identified.Needle position confirmed.Local anesthetic injected incrementally after negative aspiration. Local anesthetic spread visualized around nerve(s). Vascular puncture avoided. No complications. Image printed for medical record.The patient tolerated the procedure well.       Femoral nerve block Procedure Name: LMA Insertion Date/Time: 12/21/2011 10:56 AM Performed by: Tyrone Nine Pre-anesthesia Checklist: Patient identified, Timeout performed, Emergency Drugs available, Suction available and Patient being monitored Patient Re-evaluated:Patient Re-evaluated prior to inductionOxygen Delivery Method: Circle system utilized Preoxygenation: Pre-oxygenation with 100% oxygen Intubation Type: IV induction Ventilation: Mask ventilation without difficulty

## 2011-12-21 NOTE — Anesthesia Preprocedure Evaluation (Addendum)
Anesthesia Evaluation  Patient identified by MRN, date of birth, ID band Patient awake    Reviewed: Allergy & Precautions, H&P , NPO status , Patient's Chart, lab work & pertinent test results  Airway Mallampati: II TM Distance: >3 FB     Dental  (+) Dental Advisory Given, Caps and Teeth Intact   Pulmonary sleep apnea and Continuous Positive Airway Pressure Ventilation , former smoker   Pulmonary exam normal       Cardiovascular hypertension, Pt. on medications + Valvular Problems/Murmurs MR Rhythm:Regular Rate:Normal     Neuro/Psych    GI/Hepatic Neg liver ROS, GERD-  Medicated and Controlled,  Endo/Other  negative endocrine ROS  Renal/GU negative Renal ROS     Musculoskeletal  (+) Arthritis -, Osteoarthritis,  Fibromyalgia -  Abdominal   Peds  Hematology negative hematology ROS (+)   Anesthesia Other Findings Full upper Bridge  Reproductive/Obstetrics                          Anesthesia Physical Anesthesia Plan  ASA: III  Anesthesia Plan: General   Post-op Pain Management: MAC Combined w/ Regional for Post-op pain   Induction: Intravenous  Airway Management Planned: LMA  Additional Equipment:   Intra-op Plan:   Post-operative Plan: Extubation in OR  Informed Consent: I have reviewed the patients History and Physical, chart, labs and discussed the procedure including the risks, benefits and alternatives for the proposed anesthesia with the patient or authorized representative who has indicated his/her understanding and acceptance.   Dental advisory given  Plan Discussed with: CRNA and Surgeon  Anesthesia Plan Comments:        Anesthesia Quick Evaluation

## 2011-12-21 NOTE — Progress Notes (Signed)
Notified Dr.Edwards of pt. Having one ring on left ring finger that will not come off.

## 2011-12-21 NOTE — Preoperative (Signed)
Beta Blockers   Reason not to administer Beta Blockers:Not Applicable 

## 2011-12-21 NOTE — Progress Notes (Signed)
Orthopedic Tech Progress Note Patient Details:  Lydia Campbell 11-25-1940 [id number]  Patient ID: Lydia Campbell, female   DOB: Apr 04, 1941, 71 y.o.   MRN: [id number] Viewed order from rn order list  [name] 12/21/2011, 2:01 PM

## 2011-12-21 NOTE — Anesthesia Postprocedure Evaluation (Signed)
  Anesthesia Post-op Note  Patient: Lydia Campbell  Procedure(s) Performed: Procedure(s) (LRB): TOTAL KNEE ARTHROPLASTY (Right)  Patient Location: PACU  Anesthesia Type: GA combined with regional for post-op pain  Level of Consciousness: awake  Airway and Oxygen Therapy: Patient Spontanous Breathing and Patient connected to nasal cannula oxygen  Post-op Pain: moderate  Post-op Assessment: Post-op Vital signs reviewed, Patient's Cardiovascular Status Stable, Respiratory Function Stable, Patent Airway and No signs of Nausea or vomiting  Post-op Vital Signs: Reviewed and stable  Complications: No apparent anesthesia complications

## 2011-12-21 NOTE — Op Note (Signed)
MRN:     [id number] DOB/AGE:    1940/10/14 / 71 y.o.       OPERATIVE REPORT    DATE OF PROCEDURE:  12/21/2011       PREOPERATIVE DIAGNOSIS:   DJD RIGHT KNEE      Estimated Body mass index is 34.46 kg/(m^2) as calculated from the following:   Height as of 11/04/11: 5\' 7" (1.702 m).   Weight as of 11/04/11: 220 lb(99.791 kg).                                                        POSTOPERATIVE DIAGNOSIS:   DJD RIGHT KNEE                                                                      PROCEDURE:  Procedure(s): TOTAL KNEE ARTHROPLASTY Using Depuy Sigma RP implants #4 Narrow Femur, #4Tibia, 12.57mm sigma RP bearing, 32 Patella     SURGEON: Wyland Rastetter A    ASSISTANT:  Kirstin Shepperson PA-C   (Present and scrubbed throughout the case, critical for assistance with exposure, retraction, instrumentation, and closure.)         ANESTHESIA: GET with Femoral Nerve Block  DRAINS: foley, 2 medium hemovac in knee   TOURNIQUET TIME:   COMPLICATIONS:  None     SPECIMENS: None   INDICATIONS FOR PROCEDURE: The patient has  DJD RIGHT KNEE, varus deformities, XR shows bone on bone arthritis. Patient has failed all conservative measures including anti-inflammatory medicines, narcotics, attempts at  exercise and weight loss, cortisone injections and viscosupplementation.  Risks and benefits of surgery have been discussed, questions answered.   DESCRIPTION OF PROCEDURE: The patient identified by armband, received  right femoral nerve block and IV antibiotics, in the holding area at [facility]. Patient taken to the operating room, appropriate anesthetic  monitors were attached General endotracheal anesthesia induced with  the patient in supine position, Foley catheter was inserted. Tourniquet  applied high to the operative thigh. Lateral post and foot positioner  applied to the table, the lower extremity was then prepped and draped  in usual sterile fashion from the ankle to the  tourniquet. Time-out procedure was performed. The limb was wrapped with an Esmarch bandage and the tourniquet inflated to 365 mmHg. We began the operation by making the anterior midline incision starting at handbreadth above the patella going over the patella 1 cm medial to and  4 cm distal to the tibial tubercle. Small bleeders in the skin and the  subcutaneous tissue identified and cauterized. Transverse retinaculum was incised and reflected medially and a medial parapatellar arthrotomy was accomplished. the patella was everted and theprepatellar fat pad resected. The superficial medial collateral  ligament was then elevated from anterior to posterior along the proximal  flare of the tibia and anterior half of the menisci resected. The knee was hyperflexed exposing bone on bone arthritis. Peripheral and notch osteophytes as well as the cruciate ligaments were then resected. We continued to  work our way around posteriorly along the proximal tibia, and externally  rotated the  tibia subluxing it out from underneath the femur. A McHale  retractor was placed through the notch and a lateral Hohmann retractor  placed, and we then drilled through the proximal tibia in line with the  axis of the tibia followed by an intramedullary guide rod and 2-degree  posterior slope cutting guide. The tibial cutting guide was pinned into place  allowing resection of 8 mm of bone medially and about 2 mm of bone  laterally because of her valgus deformity. Satisfied with the tibial resection, we then  entered the distal femur 2 mm anterior to the PCL origin with the  intramedullary guide rod and applied the distal femoral cutting guide  set at 11mm, with 5 degrees of valgus. This was pinned along the  epicondylar axis. At this point, the distal femoral cut was accomplished without difficulty. We then sized for a #4 femoral component and pinned the guide in 3 degrees of external rotation.The chamfer cutting guide was  pinned into place. The anterior, posterior, and chamfer cuts were accomplished without difficulty followed by  the Sigma RP box cutting guide and the box cut. We also removed posterior osteophytes from the posterior femoral condyles. At this  time, the knee was brought into full extension. We checked our  extension and flexion gaps and found them symmetric at 12.74mm.  The patella thickness measured at 24 mm. We set the cutting guide at 15 and removed the posterior 9 mm  of the patella sized for 32 button and drilled the lollipop. The knee  was then once again hyperflexed exposing the proximal tibia. We sized for a #4 tibial base plate, applied the smokestack and the conical reamer followed by the the Delta fin keel punch. We then hammered into place the Sigma RP trial femoral component, inserted a 12.5-mm trial bearing, trial patellar button, and took the knee through range of motion from 0-130 degrees. No thumb pressure was required for patellar  tracking. At this point, all trial components were removed, a double batch of DePuy HV cement with 1.2 grams of tobra was mixed and applied to all bony metallic mating surfaces except for the posterior condyles of the femur itself. In order, we  hammered into place the tibial tray and removed excess cement, the femoral component and removed excess cement, a 12.5-mm Sigma RP bearing  was inserted, and the knee brought to full extension with compression.  The patellar button was clamped into place, and excess cement  removed. While the cement cured the wound was irrigated out with normal saline solution pulse lavage, and medium Hemovac drains were placed.. Ligament stability and patellar tracking were checked and found to be excellent. The tourniquet was then released and hemostasis was obtained with cautery. The parapatellar arthrotomy was closed with Z lock suture. The subcutaneous tissue with 0 and 2-0 undyed  Vicryl suture, and 4-0 Monocryl.. A dressing of  Xeroform,  4 x 4, dressing sponges, Webril, and Ace wrap applied. Needle and sponge count were correct times 2.The patient awakened, extubated, and taken to recovery room without difficulty. Vascular status was normal, pulses 2+ and symmetric.   Yentl Verge A 12/21/2011, 12:16 PM

## 2011-12-21 NOTE — Progress Notes (Signed)
Report given to Rebecca S RN  

## 2011-12-21 NOTE — Progress Notes (Signed)
Orthopedic Tech Progress Note Patient Details:  Lydia Campbell Jun 11, 1941 [id number]  CPM Right Knee CPM Right Knee: On Right Knee Flexion (Degrees): 90  Right Knee Extension (Degrees): 0  Additional Comments: trapeze bar patient helper   [name] 12/21/2011, 2:01 PM

## 2011-12-21 NOTE — Transfer of Care (Signed)
Immediate Anesthesia Transfer of Care Note  Patient: Lydia Campbell  Procedure(s) Performed: Procedure(s) (LRB): TOTAL KNEE ARTHROPLASTY (Right)  Patient Location: PACU  Anesthesia Type: General  Level of Consciousness: sedated  Airway & Oxygen Therapy: Patient Spontanous Breathing and Patient connected to nasal cannula oxygen  Post-op Assessment: Report given to PACU RN and Post -op Vital signs reviewed and stable  Post vital signs: stable  Complications: No apparent anesthesia complications

## 2011-12-21 NOTE — Interval H&P Note (Signed)
History and Physical Interval Note:  12/21/2011 10:36 AM  Lydia Campbell  has presented today for surgery, with the diagnosis of DJD RIGHT KNEE  The various methods of treatment have been discussed with the patient and family. After consideration of risks, benefits and other options for treatment, the patient has consented to  Procedure(s) (LRB): TOTAL KNEE ARTHROPLASTY (Right) as a surgical intervention .  The patients' history has been reviewed, patient examined, no change in status, stable for surgery.  I have reviewed the patients' chart and labs.  Questions were answered to the patient's satisfaction.     Salvatore Marvel A

## 2011-12-22 ENCOUNTER — Encounter (HOSPITAL_COMMUNITY): Payer: Self-pay | Admitting: Orthopedic Surgery

## 2011-12-22 DIAGNOSIS — E876 Hypokalemia: Secondary | ICD-10-CM

## 2011-12-22 DIAGNOSIS — D500 Iron deficiency anemia secondary to blood loss (chronic): Secondary | ICD-10-CM | POA: Diagnosis not present

## 2011-12-22 LAB — BASIC METABOLIC PANEL WITH GFR
BUN: 15 mg/dL (ref 6–23)
CO2: 24 meq/L (ref 19–32)
Calcium: 8.5 mg/dL (ref 8.4–10.5)
Chloride: 100 meq/L (ref 96–112)
Creatinine, Ser: 0.61 mg/dL (ref 0.50–1.10)
GFR calc Af Amer: >90 mL/min
GFR calc non Af Amer: >90 mL/min
Glucose, Bld: 127 mg/dL — ABNORMAL HIGH (ref 70–99)
Potassium: 3.4 meq/L — ABNORMAL LOW (ref 3.5–5.1)
Sodium: 136 meq/L (ref 135–145)

## 2011-12-22 LAB — PROTIME-INR
INR: 1.12 (ref 0.00–1.49)
Prothrombin Time: 14.6 s (ref 11.6–15.2)

## 2011-12-22 LAB — CBC
Hemoglobin: 10.2 g/dL — ABNORMAL LOW (ref 12.0–15.0)
MCH: 33.6 pg (ref 26.0–34.0)
MCHC: 33.9 g/dL (ref 30.0–36.0)
RDW: 13.8 % (ref 11.5–15.5)

## 2011-12-22 MED ORDER — SODIUM CHLORIDE 0.9 % IV BOLUS (SEPSIS)
500.0000 mL | Freq: Once | INTRAVENOUS | Status: AC
  Administered 2011-12-22: 500 mL via INTRAVENOUS

## 2011-12-22 MED ORDER — POTASSIUM CHLORIDE CRYS ER 20 MEQ PO TBCR
20.0000 meq | EXTENDED_RELEASE_TABLET | Freq: Two times a day (BID) | ORAL | Status: DC
  Administered 2011-12-22 – 2011-12-23 (×3): 20 meq via ORAL
  Filled 2011-12-22 (×3): qty 1

## 2011-12-22 MED ORDER — METOCLOPRAMIDE HCL 5 MG/ML IJ SOLN
5.0000 mg | Freq: Three times a day (TID) | INTRAMUSCULAR | Status: DC
  Administered 2011-12-22 – 2011-12-23 (×2): 10 mg via INTRAVENOUS
  Filled 2011-12-22 (×5): qty 2

## 2011-12-22 MED ORDER — WARFARIN - PHARMACIST DOSING INPATIENT: Freq: Every day | Status: DC

## 2011-12-22 MED ORDER — WARFARIN SODIUM 5 MG PO TABS
5.0000 mg | ORAL_TABLET | Freq: Once | ORAL | Status: AC
  Administered 2011-12-22: 5 mg via ORAL
  Filled 2011-12-22: qty 1

## 2011-12-22 MED ORDER — HYDROCODONE-ACETAMINOPHEN 5-325 MG PO TABS
1.0000 | ORAL_TABLET | ORAL | Status: DC | PRN
  Administered 2011-12-22: 2 via ORAL
  Filled 2011-12-22 (×2): qty 2

## 2011-12-22 MED ORDER — METOCLOPRAMIDE HCL 10 MG PO TABS
10.0000 mg | ORAL_TABLET | Freq: Three times a day (TID) | ORAL | Status: DC
  Administered 2011-12-22 – 2011-12-23 (×3): 10 mg via ORAL
  Filled 2011-12-22 (×7): qty 1

## 2011-12-22 NOTE — Discharge Instructions (Signed)
Home Health physical therapy and RN for coumadin monitoring to be provided by [facility]- [phone].

## 2011-12-22 NOTE — Progress Notes (Signed)
Physical Therapy Treatment Note   12/22/11 1413  PT Visit Information  Last PT Received On 12/22/11  Assistance Needed +1  PT Time Calculation  PT Start Time 1413  PT Stop Time 1440  PT Time Calculation (min) 27 min  Subjective Data  Subjective Pt received transfering from [facility] with assist of family. PT took over assisting due to patient wrapped in IV line and poorly positioned KI on R LE>  Precautions  Precautions Knee;Fall  Required Braces or Orthoses Knee Immobilizer - Right  Restrictions  RLE Weight Bearing WBAT  Cognition  Overall Cognitive Status Appears within functional limits for tasks assessed/performed  Arousal/Alertness Awake/alert  Orientation Level Oriented X4 / Intact  Behavior During Session [facility] for tasks performed  Transfers  Sit to Stand 3: Mod assist;From bed;With upper extremity assist (minA from [facility])  Details for Transfer Assistance educated patient's spouse on how to use patients belt at home to assist her from low surfaces. Patient spouse with good return demonstration.  Ambulation/Gait  Ambulation/Gait Assistance 4: Min assist  Ambulation Distance (Feet) 30 Feet  Assistive device Rolling walker  Ambulation/Gait Assistance Details limited by onset of lightheadedness  Gait Pattern Step-to pattern;Decreased step length - right;Decreased stance time - right;Antalgic  Gait velocity slow  Exercises  Exercises Total Joint  Total Joint Exercises  Heel Slides AROM;Right;10 reps;Seated (with towel below foot)  Goniometric ROM R knee at 70 degrees in sitting  PT - End of Session  Equipment Utilized During Treatment Gait belt;Right knee immobilizer  Activity Tolerance Patient limited by fatigue (and nausea)  Patient left in chair;with call bell/phone within reach;with family/visitor present  Nurse Communication Mobility status  PT - Assessment/Plan  Comments on Treatment Session Pt with improved ambulation tolerance however remains limited by onset of  nausea/lightheadedness. Patient reports completeing HEP.  PT Plan Discharge plan remains appropriate;Frequency remains appropriate  PT Frequency 7X/week  Follow Up Recommendations Home health PT;Supervision/Assistance - 24 hour  Equipment Recommended None recommended by PT  Acute Rehab PT Goals  Time For Goal Achievement 12/29/11  Potential to Achieve Goals Good  PT Goal: Supine/Side to Sit - Progress Progressing toward goal  PT Goal: Sit to Stand - Progress Progressing toward goal  PT Goal: Ambulate - Progress Progressing toward goal  PT Goal: Perform Home Exercise Program - Progress Progressing toward goal    Pain: reports of minimal pain in R knee  Lewis Shock, PT, DPT Pager #: [phone] Office #: [phone]

## 2011-12-22 NOTE — Progress Notes (Signed)
Patient ID: Lydia Campbell, female   DOB: 01/02/1941, 71 y.o.   MRN: [id number] PATIENT ID: Lydia Campbell  MRN: [id number]  DOB/AGE:  1941-02-18 / 71 y.o.  1 Day Post-Op Procedure(s) (LRB): TOTAL KNEE ARTHROPLASTY (Right)    PROGRESS NOTE Subjective: Patient is alert, oriented, no Nausea, no Vomiting, yes passing gas, no Bowel Movement. Taking PO yes. Denies SOB, Chest or Calf Pain. Using Incentive Spirometer, PAS in place. Ambulate not yet, CPM 0-90 Patient reports pain as 5 on 0-10 scale  .    Objective: Vital signs in last 24 hours: Filed Vitals:   12/21/11 1452 12/21/11 2150 12/21/11 2220 12/22/11 0500  BP:   149/42 133/48  Pulse: 99 70 77 85  Temp:   98.4 F (36.9 C) 98.1 F (36.7 C)  TempSrc:      Resp: 10 16 18 18   SpO2: 95% 99% 98% 98%      Intake/Output from previous day: I/O last 3 completed shifts: In: 2000 [I.V.:2000] Out: 2950 [Urine:2550; Drains:300; Blood:100]   Intake/Output this shift:     LABORATORY DATA:  Basename 12/22/11 0542 12/21/11 0800  WBC 11.7* --  HGB 10.2* --  HCT 30.1* --  PLT 184 --  NA 136 --  K 3.4* --  CL 100 --  CO2 24 --  BUN 15 --  CREATININE 0.61 --  GLUCOSE 127* --  GLUCAP -- --  INR 1.12 0.97  CALCIUM 8.5 --    Examination: ABD soft Neurovascular intact Sensation intact distally Intact pulses distally Dorsiflexion/Plantar flexion intact Incision: dressing C/D/I}  Assessment:   1 Day Post-Op Procedure(s) (LRB): TOTAL KNEE ARTHROPLASTY (Right) ADDITIONAL DIAGNOSIS:   Patient Active Problem List  Diagnoses  . MITRAL REGURGITATION  . Breast cancer  . GERD (gastroesophageal reflux disease)  . Arthritis  . Sleep apnea  . Hypertension  . Hx of blood clots  . Cancer  . Fibromyalgia  . Anxiety  . Right knee DJD  . Left knee DJD  . Blood loss anemia  . Hypokalemia    Plan: PT/OT WBAT, CPM 5/hrs day until ROM 0-90 degrees, DVT Prophylaxis:  SCDx72hrs, lovenox DISCHARGE PLAN: Home DISCHARGE NEEDS:  HHPT Fluid bolus times 2, potassium, reglan, dressing change tonight    Octavia Mottola J 12/22/2011, 7:59 AM

## 2011-12-22 NOTE — Progress Notes (Signed)
CARE MANAGEMENT NOTE 12/22/2011  Patient:  Lydia Campbell, Lydia Campbell   Account Number:  [id number]  Date Initiated:  12/22/2011  Documentation initiated by:  Vance Peper  Subjective/Objective Assessment:   71 yr old female s/p right total knee arthroplasty.     Action/Plan:   CM spoke with patient and family regarding home health needs. Choice offered. Patient wants to use [facility], in [place], [place] [phone]. Called and spoke with Stacy. Pt has DME.and family support at D/C.   Anticipated DC Date:  12/23/2011   Anticipated DC Plan:  HOME W HOME HEALTH SERVICES      DC Planning Services  CM consult      [facility] Choice  HOME HEALTH   Choice offered to / List presented to:  C-1 Patient        HH arranged  HH-2 PT  HH-1 RN      [facility] agency  OTHER - SEE NOTE   Status of service:  Completed, signed off  Discharge Disposition:  HOME W HOME HEALTH SERVICES  Per UR Regulation:    If discussed at Long Length of Stay Meetings, dates discussed:    Comments:  12/22/11 2:55 pm Vance Peper, RN BSN Case Manager Faxed orders, face sheet, H&P and op report to [facility], [place], [place] Fax: [phone].

## 2011-12-22 NOTE — Progress Notes (Signed)
ANTICOAGULATION CONSULT NOTE - Initial Consult  Pharmacy Consult for Coumadin Indication: VTE prophylaxis  Allergies  Allergen Reactions  . Morphine And Related Nausea Only    Nausea   . Penicillins Other (See Comments)    seizures        Vital Signs: Temp: 98.1 F (36.7 C) (06/04 0500) BP: 133/48 mmHg (06/04 0500) Pulse Rate: 85  (06/04 0500)  Labs:  Basename 12/22/11 0542 12/21/11 0800  HGB 10.2* --  HCT 30.1* --  PLT 184 --  APTT -- 27  LABPROT 14.6 13.1  INR 1.12 0.97  HEPARINUNFRC -- --  CREATININE 0.61 --  CKTOTAL -- --  CKMB -- --  TROPONINI -- --    The CrCl is unknown because both a height and weight (above a minimum accepted value) are required for this calculation.   Medical History: Past Medical History  Diagnosis Date  . GERD (gastroesophageal reflux disease)   . Arthritis   . Asthma   . Blood transfusion   . Clotting disorder   . Sleep apnea   . Heart murmur   . Hypertension   . Hx of blood clots 8/12    lungs  . H/O hiatal hernia   . Seizures     from pcn  . Cancer     breast lft  . Fibromyalgia   . Anxiety   . Right knee DJD   . Left knee DJD        Assessment: Patient 71 yo female s/p total right knee arthroplasty on 6/3 Goal of Therapy:  INR 2-3 Monitor platelets by anticoagulation protocol: Yescoumadin 5 mg    Plan:  Coumadin 5 mg po today Daily pt/inr  Lucille Passy 12/22/2011,10:39 AM

## 2011-12-22 NOTE — Progress Notes (Signed)
Occupational Therapy Evaluation Patient Details Name: Lydia Campbell MRN: [id number] DOB: [birth year] Today's Date: 12/22/2011 Time:  -     OT Assessment / Plan / Recommendation Clinical Impression  71 yo s/p R TKA. On entry to the room, famiy trying to transfer pateint. Asked family to have staff assist patient with transfers until she was safe to transfer with family. They verbally understood. Pt will benefit from skilled OT services to max indep with ADL and functional mobility for ADL with nec DME and AE to facilitate safe D/C home with husband.    OT Assessment  Patient needs continued OT Services    Follow Up Recommendations  No OT follow up    Barriers to Discharge None    Equipment Recommendations  None recommended by OT    Recommendations for Other Services    Frequency  Min 2X/week    Precautions / Restrictions Precautions Precautions: Knee;Fall Required Braces or Orthoses: Knee Immobilizer - Right Knee Immobilizer - Right: On when out of bed or walking Restrictions Weight Bearing Restrictions: Yes RLE Weight Bearing: Weight bearing as tolerated   Pertinent Vitals/Pain 4    ADL  Grooming: Simulated;Set up Where Assessed - Grooming: Unsupported sitting Upper Body Bathing: Simulated;Set up Where Assessed - Upper Body Bathing: Unsupported sitting Lower Body Bathing: Simulated;Maximal assistance Where Assessed - Lower Body Bathing: Supported sit to stand Upper Body Dressing: Simulated;Set up Where Assessed - Upper Body Dressing: Unsupported sitting Lower Body Dressing: Simulated;Maximal assistance Where Assessed - Lower Body Dressing: Sopported sit to stand Toilet Transfer: Simulated;Moderate assistance Toilet Transfer Method: Sit to stand Toilet Transfer Equipment: Bedside commode Toileting - Clothing Manipulation and Hygiene: Simulated;Moderate assistance Where Assessed - Toileting Clothing Manipulation and Hygiene: Standing Tub/Shower Transfer:  Simulated;Moderate assistance Tub/Shower Transfer Method: Stand pivot Tub/Shower Transfer Equipment: Other (comment) Equipment Used: Gait belt;Knee Immobilizer Transfers/Ambulation Related to ADLs: mod A ADL Comments: Mod - max A with LB ADL    OT Diagnosis: Generalized weakness;Acute pain  OT Problem List: Decreased strength;Decreased activity tolerance;Decreased safety awareness;Decreased knowledge of use of DME or AE;Obesity;Pain OT Treatment Interventions: Self-care/ADL training;Energy conservation;DME and/or AE instruction;Therapeutic activities;Patient/family education   OT Goals Acute Rehab OT Goals OT Goal Formulation: With patient Time For Goal Achievement: 12/29/11 Potential to Achieve Goals: Good ADL Goals Pt Will Perform Lower Body Bathing: with supervision;with caregiver independent in assisting;Sit to stand from bed;Supported;with adaptive equipment;with cueing (comment type and amount) ADL Goal: Lower Body Bathing - Progress: Goal set today Pt Will Perform Lower Body Dressing: with supervision;with caregiver independent in assisting;Sit to stand from bed;Supported;with adaptive equipment;with cueing (comment type and amount) ADL Goal: Lower Body Dressing - Progress: Goal set today Pt Will Transfer to Toilet: with supervision;with caregiver independent in assisting;Ambulation;with DME;3-in-1;with cueing (comment type and amount) ADL Goal: Toilet Transfer - Progress: Goal set today Pt Will Perform Toileting - Clothing Manipulation: with supervision;with caregiver independent in assisting;Standing ADL Goal: Toileting - Clothing Manipulation - Progress: Goal set today Pt Will Perform Toileting - Hygiene: with modified independence;Standing at 3-in-1/toilet ADL Goal: Toileting - Hygiene - Progress: Goal set today Pt Will Perform Tub/Shower Transfer: with min assist;with caregiver independent in assisting;with DME;Ambulation;Transfer tub bench;with cueing (comment type and  amount) ADL Goal: Tub/Shower Transfer - Progress: Goal set today  Visit Information  Last OT Received On: 12/22/11 Assistance Needed: +1    Subjective Data   thank you for helping me.   Prior Functioning  Home Living Lives With: Spouse Available Help at Discharge: Family;Available 24 hours/day  Type of Home: House Home Access: Stairs to enter [employer] of Steps: 1 Entrance Stairs-Rails: None Home Layout: One level Bathroom Shower/Tub: Tub/shower unit;Walk-in shower;Curtain Bathroom Toilet: Handicapped height Bathroom Accessibility: Yes How Accessible: Accessible via walker Home Adaptive Equipment: Tub transfer bench;Walker - rolling;Bedside commode/3-in-1 Prior Function Level of Independence: Independent Able to Take Stairs?: Yes Driving: Yes Vocation: Retired Musician: No difficulties Dominant Hand: Right    Cognition  Overall Cognitive Status: Appears within functional limits for tasks assessed/performed Arousal/Alertness: Awake/alert Orientation Level: Oriented X4 / Intact Behavior During Session: WFL for tasks performed    Extremity/Trunk Assessment Right Upper Extremity Assessment RUE ROM/Strength/Tone: Within functional levels Left Upper Extremity Assessment LUE ROM/Strength/Tone: Within functional levels   Mobility Bed Mobility Bed Mobility: Sit to Supine Sit to Supine: 3: Mod assist;With rail;HOB elevated Details for Bed Mobility Assistance: educated husband on need to manage LLE Transfers Transfers: Sit to Stand;Stand to Sit Sit to Stand: 2: Max assist;With upper extremity assist;From chair/3-in-1 Stand to Sit: 3: Mod assist;To elevated surface;To bed;With upper extremity assist Details for Transfer Assistance: vc for proper hand placement and safety.    Exercise Total Joint Exercises Heel Slides: AROM;Right;10 reps;Seated (with towel below foot) Goniometric ROM: R knee at 70 degrees in sitting  Balance  WFL for ADL  End  of Session OT - End of Session Equipment Utilized During Treatment: Gait belt Activity Tolerance: Patient limited by fatigue Patient left: in bed;with call bell/phone within reach;with family/visitor present Nurse Communication: Mobility status   Destaney Sarkis,HILLARY 12/22/2011, 4:15 PM [facility], OTR/L  [phone] 12/22/2011

## 2011-12-22 NOTE — Progress Notes (Signed)
Physical Therapy Evaluation Note  Past Medical History  Diagnosis Date  . GERD (gastroesophageal reflux disease)   . Arthritis   . Asthma   . Blood transfusion   . Clotting disorder   . Sleep apnea   . Heart murmur   . Hypertension   . Hx of blood clots 8/12    lungs  . H/O hiatal hernia   . Seizures     from pcn  . Cancer     breast lft  . Fibromyalgia   . Anxiety   . Right knee DJD   . Left knee DJD     Past Surgical History  Procedure Date  . Abdominal hysterectomy   . Tonsillectomy   . Breast surgery   . Knee cartilage surgery 2001 and 2009    bil  . Breast surgery 03-28-08    lft  . Mastectomy     lft  . Total knee arthroplasty 12/21/2011    Procedure: TOTAL KNEE ARTHROPLASTY;  Surgeon: Nilda Simmer, MD;  Location: [facility] OR;  Service: Orthopedics;  Laterality: Right;  DR Anne Ng 90 MINUTES FOR THIS CASE     12/22/11 0941  PT Visit Information  Last PT Received On 12/22/11  Assistance Needed +1  PT Time Calculation  PT Start Time 0941  PT Stop Time 1016  PT Time Calculation (min) 35 min  Subjective Data  Subjective Pt received supine in bed with report of 3/10 R knee pain  Precautions  Precautions Knee;Fall  Required Braces or Orthoses Knee Immobilizer - Right  Knee Immobilizer - Right On when out of bed or walking  Restrictions  Weight Bearing Restrictions Yes  RLE Weight Bearing WBAT  Home Living  Lives With Spouse  Available Help at Discharge Family;Available 24 hours/day  Type of Home House  Home Access Stairs to enter  Entrance Stairs-Number of Steps 1  Entrance Stairs-Rails None  Home Layout One level (with basement)  Bathroom Shower/Tub Tub/shower unit;Walk-in shower;Curtain (door on walk in)  [employer] Handicapped height  Home Adaptive Equipment Tub transfer bench;Walker - rolling;Bedside commode/3-in-1  Prior Function  Level of Independence Independent  Able to Take Stairs? 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  Cognition  Overall Cognitive Status Appears within functional limits for tasks assessed/performed  Arousal/Alertness Awake/alert  Orientation Level Oriented X4 / Intact  Behavior During Session [facility] for tasks performed  Right Upper Extremity Assessment  RUE ROM/Strength/Tone WFL  Left Upper Extremity Assessment  LUE ROM/Strength/Tone WFL  Right Lower Extremity Assessment  RLE ROM/Strength/Tone Deficits;Due to pain  RLE ROM/Strength/Tone Deficits 25 deg active R knee flex, minimal R quad set  Left Lower Extremity Assessment  LLE ROM/Strength/Tone WFL  Trunk Assessment  Trunk Assessment Normal  Bed Mobility  Bed Mobility Supine to Sit  Supine to Sit 3: Mod assist;HOB flat  Details for Bed Mobility Assistance increased time required, v/c's for sequencing, mina for R LE management  Transfers  Transfers Sit to Stand  Sit to Stand From bed;With upper extremity assist;4: Min assist;From chair/3-in-1  Details for Transfer Assistance pt with mod A from bed, v/c's for hand placement  Ambulation/Gait  Ambulation/Gait Assistance 4: Min assist  Ambulation Distance (Feet) 10 Feet  Assistive device Rolling walker  Ambulation/Gait Assistance Details limited by nausea, v/c's for sequencing and to complete R knee quad set when weight-bearing  Gait Pattern Step-to pattern;Decreased step length - right;Decreased stance time - right;Antalgic  Gait velocity slow  Stairs No  Exercises  Exercises Total Joint (handout provided for HEP)  Total Joint Exercises  Ankle Circles/Pumps AROM;Both;Supine  Quad Sets AROM;Right;10 reps;Supine  Heel Slides AAROM;Right;10 reps;Supine  Goniometric ROM approx 35 degrees R knee flex AA  PT - End of Session  Equipment Utilized During Treatment Gait belt;Right knee immobilizer  Activity Tolerance Patient limited by fatigue (nausea)  Patient left in chair;with call bell/phone within reach  Nurse Communication Mobility status  PT  Assessment  Clinical Impression Statement Pt s/p R TKA presenting with increased R knee pain, decreased R knee ROM and R LE strength. Patient with good home set up and support as well as DME. Patient shows good potential for safe d/c home.  PT Recommendation/Assessment Patient needs continued PT services  PT Problem List Decreased strength;Decreased range of motion;Decreased activity tolerance  PT Therapy Diagnosis  Difficulty walking;Abnormality of gait;Generalized weakness  PT Plan  PT Frequency 7X/week  PT Treatment/Interventions Gait training;Stair training;Functional mobility training;Therapeutic activities;Therapeutic exercise  PT Recommendation  Follow Up Recommendations Home health PT;Supervision/Assistance - 24 hour  Equipment Recommended None recommended by PT (has all recommended DME)  Individuals Consulted  Consulted and Agree with Results and Recommendations Patient  Acute Rehab PT Goals  PT Goal Formulation With patient  Time For Goal Achievement 12/29/11  Potential to Achieve Goals Good  Pt will go Supine/Side to Sit with modified independence;with HOB 0 degrees  PT Goal: Supine/Side to Sit - Progress Goal set today  Pt will go Sit to Stand with modified independence (up to RW.)  PT Goal: Sit to Stand - Progress Goal set today  Pt will Ambulate >150 feet;with modified independence;with rolling walker  PT Goal: Ambulate - Progress Goal set today  Pt will Go Up / Down Stairs 1-2 stairs;with supervision;with rolling walker ((pt with 1 STE))  PT Goal: Up/Down Stairs - Progress Goal set today  Pt will Perform Home Exercise Program Independently  PT Goal: Perform Home Exercise Program - Progress Goal set today  Written Expression  Dominant Hand Right     Pain: pt initially 3/10 in R Knee however received pain meds at beginning of tx  Lewis Shock, PT, DPT Pager #: [phone] Office #: [phone]

## 2011-12-23 LAB — CBC
HCT: 29.6 % — ABNORMAL LOW (ref 36.0–46.0)
MCHC: 33.8 g/dL (ref 30.0–36.0)
MCV: 99 fL (ref 78.0–100.0)
Platelets: 183 10*3/uL (ref 150–400)
RDW: 14 % (ref 11.5–15.5)
WBC: 11.5 10*3/uL — ABNORMAL HIGH (ref 4.0–10.5)

## 2011-12-23 LAB — BASIC METABOLIC PANEL
BUN: 13 mg/dL (ref 6–23)
Creatinine, Ser: 0.67 mg/dL (ref 0.50–1.10)
GFR calc Af Amer: >90 mL/min (ref >90)
GFR calc non Af Amer: 88 mL/min — ABNORMAL LOW (ref >90)
Potassium: 3.8 mEq/L (ref 3.5–5.1)

## 2011-12-23 LAB — PROTIME-INR: Prothrombin Time: 15.1 seconds (ref 11.6–15.2)

## 2011-12-23 MED ORDER — ENOXAPARIN SODIUM 30 MG/0.3ML ~~LOC~~ SOLN: 30.0000 mg | Freq: Two times a day (BID) | SUBCUTANEOUS | Status: DC

## 2011-12-23 MED ORDER — WARFARIN SODIUM 5 MG PO TABS
5.0000 mg | ORAL_TABLET | Freq: Once | ORAL | Status: DC
  Filled 2011-12-23: qty 1

## 2011-12-23 MED ORDER — BISACODYL 10 MG RE SUPP
10.0000 mg | Freq: Once | RECTAL | Status: AC
  Administered 2011-12-23: 10 mg via RECTAL
  Filled 2011-12-23: qty 1

## 2011-12-23 MED ORDER — DSS 100 MG PO CAPS: 100.0000 mg | ORAL_CAPSULE | Freq: Two times a day (BID) | ORAL | Status: AC

## 2011-12-23 MED ORDER — HYDROCODONE-ACETAMINOPHEN 5-325 MG PO TABS: 1.0000 | ORAL_TABLET | ORAL | Status: AC | PRN

## 2011-12-23 MED ORDER — BISACODYL 5 MG PO TBEC: 10.0000 mg | DELAYED_RELEASE_TABLET | Freq: Every day | ORAL | Status: AC | PRN

## 2011-12-23 MED ORDER — METOCLOPRAMIDE HCL 10 MG PO TABS: 10.0000 mg | ORAL_TABLET | Freq: Three times a day (TID) | ORAL | Status: DC | PRN

## 2011-12-23 NOTE — Discharge Summary (Signed)
Patient ID: AOKI WEDEMEYER MRN: [id number] DOB/AGE: 69-Oct-1942 69 y.o.  Admit date: 12/21/2011 Discharge date: 12/23/2011  Admission Diagnoses:  Principal Problem:  *Right knee DJD Active Problems:  GERD (gastroesophageal reflux disease)  Arthritis  Sleep apnea  Hypertension  Hx of blood clots  Anxiety  Blood loss anemia  Hypokalemia   Discharge Diagnoses:  Same  Past Medical History  Diagnosis Date  . GERD (gastroesophageal reflux disease)   . Arthritis   . Asthma   . Blood transfusion   . Clotting disorder   . Sleep apnea   . Heart murmur   . Hypertension   . Hx of blood clots 8/12    lungs  . H/O hiatal hernia   . Seizures     from pcn  . Cancer     breast lft  . Fibromyalgia   . Anxiety   . Right knee DJD   . Left knee DJD     Surgeries: Procedure(s): TOTAL KNEE ARTHROPLASTY on 12/21/2011   Consultants:    Discharged Condition: Improved  Hospital Course: Lydia Campbell is an 71 y.o. female who was admitted 12/21/2011 for operative treatment ofRight knee DJD. Patient has severe unremitting pain that affects sleep, daily activities, and work/hobbies. After pre-op clearance the patient was taken to the operating room on 12/21/2011 and underwent  Procedure(s): TOTAL KNEE ARTHROPLASTY.    Patient was given perioperative antibiotics: Anti-infectives     Start     Dose/Rate Route Frequency Ordered Stop   12/21/11 1123   tobramycin (NEBCIN) powder  Status:  Discontinued          As needed 12/21/11 1123 12/21/11 1247   12/21/11 0830   vancomycin (VANCOCIN) 1,500 mg in sodium chloride 0.9 % 500 mL IVPB        1,500 mg 250 mL/hr over 120 Minutes Intravenous To Shortstay C 12/21/11 0820 12/21/11 0924   12/20/11 1339   vancomycin (VANCOCIN) 1,500 mg in sodium chloride 0.9 % 500 mL IVPB  Status:  Discontinued        1,500 mg 250 mL/hr over 120 Minutes Intravenous 120 min pre-op 12/20/11 1339 12/21/11 0820           Patient was given sequential  compression devices, early ambulation, and chemoprophylaxis to prevent DVT.  Patient benefited maximally from hospital stay and there were no complications.    Recent vital signs:  Patient Vitals for the past 24 hrs:  BP Temp Pulse Resp SpO2  12/23/11 1300 126/52 mmHg 98.8 F (37.1 C) 104  18  95 %  12/23/11 0845 - - - - 98 %  12/23/11 0518 146/60 mmHg 98.4 F (36.9 C) 98  18  96 %  12/22/11 2235 130/56 mmHg 97.5 F (36.4 C) 95  18  95 %     Recent laboratory studies:   Basename 12/23/11 0600 12/22/11 0542  WBC 11.5* 11.7*  HGB 10.0* 10.2*  HCT 29.6* 30.1*  PLT 183 184  NA 135 136  K 3.8 3.4*  CL 97 100  CO2 24 24  BUN 13 15  CREATININE 0.67 0.61  GLUCOSE 123* 127*  INR 1.17 1.12  CALCIUM 8.7 --     Discharge Medications:   Medication List  As of 12/23/2011  2:35 PM   STOP taking these medications         tamoxifen 20 MG tablet         TAKE these medications         allopurinol  300 MG tablet   Commonly known as: ZYLOPRIM   Take 300 mg by mouth daily.      B-COMPLEX PO   Take 100 mg by mouth at bedtime.      Biotin 5000 MCG Caps   Take 1 capsule by mouth daily.      bisacodyl 5 MG EC tablet   Commonly known as: DULCOLAX   Take 2 tablets (10 mg total) by mouth daily as needed for constipation.      CALCIUM 500 1250 MG tablet   Generic drug: calcium carbonate   Take 1 tablet by mouth daily.      cetirizine 10 MG tablet   Commonly known as: ZYRTEC   Take 10 mg by mouth daily.      cholecalciferol 400 UNITS Tabs   Commonly known as: VITAMIN D   Take 400 Units by mouth daily.      citalopram 10 MG tablet   Commonly known as: CELEXA   Take 10 mg by mouth daily.      DSS 100 MG Caps   Take 100 mg by mouth 2 (two) times daily.      enoxaparin 30 MG/0.3ML injection   Commonly known as: LOVENOX   Inject 0.3 mLs (30 mg total) into the skin every 12 (twelve) hours.      HYDROcodone-acetaminophen 5-325 MG per tablet   Commonly known as: NORCO   Take  1-2 tablets by mouth every 4 (four) hours as needed.      KLOR-CON M20 PO   Take 1 tablet by mouth 2 (two) times daily.      MAGNESIUM PO   Take 50 mg by mouth 3 (three) times daily.      metoCLOPramide 10 MG tablet   Commonly known as: REGLAN   Take 1 tablet (10 mg total) by mouth every 8 (eight) hours as needed (nausea).      nabumetone 500 MG tablet   Commonly known as: RELAFEN   Take 500 mg by mouth daily.      NON FORMULARY   Patient uses a C PAP machine      omeprazole 20 MG capsule   Commonly known as: PRILOSEC   Take 20 mg by mouth daily.      pantoprazole 40 MG tablet   Commonly known as: PROTONIX   Take 40 mg by mouth daily.      QVAR 80 MCG/ACT inhaler   Generic drug: beclomethasone   Inhale 1 puff into the lungs 2 (two) times daily.      traZODone 50 MG tablet   Commonly known as: DESYREL   Take 50 mg by mouth at bedtime.      triamterene-hydrochlorothiazide 75-50 MG per tablet   Commonly known as: MAXZIDE   Take 1 tablet by mouth 2 (two) times daily.      vitamin C 1000 MG tablet   Take 1,000 mg by mouth daily.      warfarin 5 MG tablet   Commonly known as: COUMADIN   Take 2.5-5 mg by mouth daily. 5mg  alternating with 2.5mg             Diagnostic Studies: Dg Chest 2 View  12/09/2011  *RADIOLOGY REPORT*  Clinical Data: Preop  CHEST - 2 VIEW  Comparison: Images of the lung bases CT scan 10/21/2011  Findings: Cardiomediastinal silhouette is unremarkable.  There is left basilar atelectasis or scarring.  Mild degenerative changes thoracic spine.  Surgical clips are noted in the left axilla.  No acute infiltrate or pulmonary edema.  IMPRESSION: No acute infiltrate or pulmonary edema. Again noted left basilar atelectasis or scarring.  Original Report Authenticated By: Natasha Mead, M.D.   Dg Bone Density  11/24/2011  *RADIOLOGY REPORT*  Clinical Data: 71 year old postmenopausal female with history of breast cancer, vitamin D deficiency, fibromyalgia, gout,  asthma, GERD.  The patient takes calcium and vitamin D.  She has taken Tamoxifen in the past.  DUAL X-RAY ABSORPTIOMETRY (DXA) FOR BONE MINERAL DENSITY  AP LUMBAR SPINE (L1 - L4)  Bone Mineral Density (BMD):            1.249 g/cm2 Young Adult T Score:                          1.8 Z Score:                                                3.9  LEFT FEMUR (NECK)  Bone Mineral Density (BMD):             0.697 g/cm2 Young Adult T Score:                           -1.4 Z Score:                                                 0.4  ASSESSMENT:  Patient's diagnostic category is LOW BONE MASS by WHO Criteria.  FRACTURE RISK: MODERATE  FRAX: Based on the World Health Organization FRAX model, the 10 year probability of a major osteoporotic fracture is 8.7%.  The 10 year probability of a hip fracture is 1.0%.  Comparison: None.  RECOMMENDATIONS:  Effective therapies are available in the form of bisphosphonates, selective estrogen receptor modulators, biologic agents, and hormone replacement therapy (for women).  All patients should ensure an adequate intake of dietary calcium (1200mg  daily) and vitamin D (800 IU daily) unless contraindicated.  All treatment decisions require clinical judgement and consideration of individual patient factors, including patient preferences, co-morbidities, previous drug use, risk factors not captured in the FRAX model (e.g., frailty, falls, vitamin D deficiency, increased bone turnover, interval significant decline in bone density) and possible under-or over-estimation of fracture risk by FRAX.  The National Osteoporosis Foundation recommends that FDA-approved medical therapies be considered in postmenopausal women and mean age 82 or older with a:        1)     Hip or vertebral (clinical or morphometric) fracture.           2)    T-score of -2.5 or lower at the spine or hip. 3)    Ten-year fracture probability by FRAX of 3% or greater for hip fracture or 20% or greater for major osteoporotic  fracture. FOLLOW-UP:  People with diagnosed cases of osteoporosis or at high risk for fracture should have regular bone mineral density tests.  For patients eligible for Medicare, routine testing is allowed once every 2 years.  The testing frequency can be increased to one year for patients who have rapidly progressing disease, those who are receiving or discontinuing medical therapy to restore bone mass, or have additional  risk factors.  World Science writer [facility]) Criteria:  Normal: T scores from +1.0 to -1.0 Low Bone Mass (Osteopenia): T scores between -1.0 and -2.5 Osteoporosis: T scores -2.5 and below  Comparison to Reference Population:  T score is the key measure used in the diagnosis of osteoporosis and relative risk determination for fracture.  It provides a value for bone mass relative to the mean bone mass of a young adult reference population expressed in terms of standard deviation (SD).  Z score is the age-matched score showing the patient's values compared to a population matched for age, sex, and race.  This is also expressed in terms of standard deviation.  The patient may have values that compare favorably to the age-matched values and still be at increased risk for fracture.  Original Report Authenticated By: Daryl Eastern, M.D.    Disposition:   Discharge Orders    Future Appointments: Provider: Department: Dept Phone: Center:   03/04/2012 9:30 AM Radene Gunning [facility] [phone] None   03/04/2012 10:00 AM Pierce Crane, MD [facility] [phone] None     Future Orders Please Complete By Expires   Diet - low sodium heart healthy      Call MD / Call 911      Comments:   If you experience chest pain or shortness of breath, CALL 911 and be transported to the hospital emergency room.  If you develope a fever above 101 F, pus (white drainage) or increased drainage or redness at the wound, or calf pain, call your surgeon's office.   Constipation Prevention       Comments:   Drink plenty of fluids.  Prune juice may be helpful.  You may use a stool softener, such as Colace (over the counter) 100 mg twice a day.  Use MiraLax (over the counter) for constipation as needed.   Increase activity slowly as tolerated      Discharge instructions      Comments:   Total Knee Replacement Care After Refer to this sheet in the next few weeks. These discharge instructions provide you with general information on caring for yourself after you leave the hospital. Your caregiver may also give you specific instructions. Your treatment has been planned according to the most current medical practices available, but unavoidable complications sometimes occur. If you have any problems or questions after discharge, please call your caregiver. Regaining a near full range of motion of your knee within the first 3 to 6 weeks after surgery is critical. HOME CARE INSTRUCTIONS  You may resume a normal diet and activities as directed.  Perform exercises as directed.  Place yellow foam block, yellow side up under heel at all times except when in CPM or when walking.  DO NOT modify, tear, cut, or change in any way. You will receive physical therapy daily  Take showers instead of baths until informed otherwise.  Change bandages (dressings)daily Do not take over-the-counter or prescription medicines for pain, discomfort, or fever. Eat a well-balanced diet.  Avoid lifting or driving until you are instructed otherwise.  Make an appointment to see your caregiver for stitches (suture) or staple removal as directed.  If you have been sent home with a continuous passive motion machine (CPM machine), 0-90 degrees 6 hrs a day   2 hrs a shift SEEK MEDICAL CARE IF: You have swelling of your calf or leg.  You develop shortness of breath or chest pain.  You have redness, swelling, or increasing pain in  the wound.  There is pus or any unusual drainage coming from the surgical site.  You notice a bad  smell coming from the surgical site or dressing.  The surgical site breaks open after sutures or staples have been removed.  There is persistent bleeding from the suture or staple line.  You are getting worse or are not improving.  You have any other questions or concerns.  SEEK IMMEDIATE MEDICAL CARE IF:  You have a fever.  ny way the yellow foam block.You develop a rash.  You have difficulty breathing.  You develop any reaction or side effects to medicines given.  Your knee motion is decreasing rather than improving.  MAKE SURE YOU:  Understand these instructions.  Will watch your condition.  Will get help right away if you are not doing well or get worse.     CPM      Comments:   Continuous passive motion machine (CPM):      Use the CPM from 0 to 90 for 6 hours per day.       You may break it up into 2 or 3 sessions per day.      Use CPM for 2 weeks or until you are told to stop.   TED hose      Comments:   Use stockings (TED hose) for 2 weeks on both leg(s).  You may remove them at night for sleeping.   Change dressing      Comments:   Change the dressing daily with sterile 4 x 4 inch gauze dressing and apply TED hose.  You may clean the incision with alcohol prior to redressing.   Do not put a pillow under the knee. Place it under the heel.      Comments:   Place yellow foam block, yellow side up under heel at all times except when in CPM or when walking.  DO NOT modify, tear, cut, or change in any way the yellow foam block.      Follow-up Information    Follow up with Nilda Simmer, MD on 01/04/2012. (appt time 2:30 pm)    Contact information:   [facility] [address], [address] [place] [address] [phone]           Signed: Pascal Lux 12/23/2011, 2:35 PM

## 2011-12-23 NOTE — Progress Notes (Signed)
PT Progress Note:     12/23/11 1400  PT Visit Information  Last PT Received On 12/23/11  Assistance Needed +1  PT Time Calculation  PT Start Time 1324  PT Stop Time 1400  PT Time Calculation (min) 36 min  Precautions  Precautions Knee;Fall  Required Braces or Orthoses Knee Immobilizer - Right  Knee Immobilizer - Right On when out of bed or walking  Restrictions  RLE Weight Bearing WBAT  Cognition  Overall Cognitive Status Appears within functional limits for tasks assessed/performed  Arousal/Alertness Awake/alert  Orientation Level Oriented X4 / Intact  Behavior During Session [facility] for tasks performed  Bed Mobility  Bed Mobility Supine to Sit;Sitting - Scoot to Edge of Bed  Supine to Sit 4: Min assist;HOB flat  Sitting - Scoot to [place] of Bed 4: Min assist  Details for Bed Mobility Assistance (A) for LE's & to lift shoulders/trunk to sitting upright.    Transfers  Transfers Sit to Stand;Stand to Sit  Sit to Stand 4: Min assist;With upper extremity assist;With armrests;From bed;From chair/3-in-1  Stand to Sit 3: Mod assist;4: Min assist;With upper extremity assist;With armrests;To chair/3-in-1  Details for Transfer Assistance Mod (A) to control descent when pt does not extend RLE.  Kirsten, ortho PA, present & encouraged pt not to extend RLE to promote knee flexion.  Cues for hand placement & technique.    Ambulation/Gait  Ambulation/Gait Assistance 4: Min guard  Ambulation Distance (Feet) 25 Feet  Assistive device Rolling walker  Ambulation/Gait Assistance Details Cues for R<>L weight shifting to advance LE's, increase WBing through RLE, tall posture, look up, increase step length, & max encouragement to increase distance.  Kirsten, PA, present & provided max encouragement & cues for technique.    Gait Pattern Step-to pattern;Decreased step length - left;Decreased stance time - right;Decreased weight shift to left;Decreased weight shift to right;Decreased hip/knee flexion -  left;Decreased hip/knee flexion - right;Trunk flexed  Gait velocity slow  Stairs Yes  Stairs Assistance 3: Mod assist  Stairs Assistance Details (indicate cue type and reason) (A) for balance & provided under arms to help lift body up onto step.  Cues for sequencing & technique.    Stair Management Technique No rails;Forwards;With walker  Number of Stairs 1  (4x's)  PT - End of Session  Equipment Utilized During Treatment Gait belt;Right knee immobilizer  Activity Tolerance Patient limited by fatigue  Patient left Other (comment) (with OT in ortho gym room)  Nurse Communication Mobility status  PT - Assessment/Plan  Comments on Treatment Session Kirsten, [place], present entire session & discussed d/c plans with pt.  Pt expresses concern about returning home but PA strongly encourages d/c home with HHPT.     PT Plan Discharge plan remains appropriate;Frequency remains appropriate  PT Frequency 7X/week  Follow Up Recommendations Home health PT;Supervision/Assistance - 24 hour  Equipment Recommended None recommended by PT  Acute Rehab PT Goals  Time For Goal Achievement 12/29/11  Potential to Achieve Goals Good  PT Goal: Supine/Side to Sit - Progress Progressing toward goal  PT Goal: Sit to Stand - Progress Progressing toward goal  PT Goal: Ambulate - Progress Progressing toward goal  PT Goal: Up/Down Stairs - Progress Progressing toward goal     Verdell Face, PTA [phone] 12/23/2011

## 2011-12-23 NOTE — Progress Notes (Signed)
Referral received for SNF. Chart reviewed and CSW has spoken with RNCM who indicates that patient is for DC to home with Home Health and DME.  CSW to sign off. Please re-consult if CSW needs arise.  Chanz Cahall T. Asta Corbridge, LCSWA  [phone]  

## 2011-12-23 NOTE — Progress Notes (Signed)
Occupational Therapy Treatment Patient Details Name: Lydia Campbell MRN: [id number] DOB: 1941-01-05 Today's Date: 12/23/2011 Time: 7829-5621 OT Time Calculation (min): 22 min  OT Assessment / Plan / Recommendation Comments on Treatment Session pt able to complete tubbench trasfer with family - very slow.    Follow Up Recommendations  No OT follow up    Barriers to Discharge   none    Equipment Recommendations  None recommended by PT    Recommendations for Other Services  none  Frequency Min 2X/week   Plan Discharge plan remains appropriate    Precautions / Restrictions Precautions Precautions: Knee;Fall Required Braces or Orthoses: Knee Immobilizer - Right Knee Immobilizer - Right: On when out of bed or walking Restrictions Weight Bearing Restrictions: Yes RLE Weight Bearing: Weight bearing as tolerated   Pertinent Vitals/Pain 4    ADL  Tub/Shower Transfer Method: Ambulating Tub/Shower Transfer Equipment: Counsellor Used: Gait belt;Knee Immobilizer ADL Comments: min  - mod A with ADL    OT Diagnosis:    OT Problem List:   OT Treatment Interventions:     OT Goals Acute Rehab OT Goals OT Goal Formulation: With patient Time For Goal Achievement: 12/29/11 Potential to Achieve Goals: Good ADL Goals Pt Will Perform Lower Body Bathing: with supervision;with caregiver independent in assisting;Sit to stand from bed;Supported;with adaptive equipment;with cueing (comment type and amount) ADL Goal: Lower Body Bathing - Progress: Progressing toward goals Pt Will Perform Lower Body Dressing: with supervision;with caregiver independent in assisting;Sit to stand from bed;Supported;with adaptive equipment;with cueing (comment type and amount) ADL Goal: Lower Body Dressing - Progress: Progressing toward goals Pt Will Transfer to Toilet: with supervision;with caregiver independent in assisting;Ambulation;with DME;3-in-1;with cueing (comment type and amount) ADL  Goal: Toilet Transfer - Progress: Progressing toward goals Pt Will Perform Toileting - Clothing Manipulation: with supervision;with caregiver independent in assisting;Standing ADL Goal: Toileting - Clothing Manipulation - Progress: Progressing toward goals Pt Will Perform Toileting - Hygiene: with modified independence;Standing at 3-in-1/toilet ADL Goal: Toileting - Hygiene - Progress: Progressing toward goals Pt Will Perform Tub/Shower Transfer: with min assist;with caregiver independent in assisting;with DME;Ambulation;Transfer tub bench;with cueing (comment type and amount) ADL Goal: Tub/Shower Transfer - Progress: Met  Visit Information  Last OT Received On: 12/23/11 Assistance Needed: +1    Subjective Data      Prior Functioning       Cognition  Overall Cognitive Status: Appears within functional limits for tasks assessed/performed Arousal/Alertness: Awake/alert Orientation Level: Oriented X4 / Intact Behavior During Session: [facility] for tasks performed    Mobility Bed Mobility Bed Mobility: Supine to Sit;Sitting - Scoot to Edge of Bed Supine to Sit: 4: Min assist;HOB flat Sitting - Scoot to [place] of Bed: 4: Min assist Details for Bed Mobility Assistance: (A) for LE's & to lift shoulders/trunk to sitting upright.   Transfers Transfers: Sit to Stand;Stand to Sit Sit to Stand: With upper extremity assist;From chair/3-in-1;Other (comment) (improvemtn from elevated surface) Stand to Sit: 3: Mod assist;4: Min assist;With upper extremity assist;With armrests;To chair/3-in-1;To elevated surface;Other (comment) (uncontrolled descent.) Details for Transfer Assistance: cues for dontrolled decent   Exercises    Balance    End of Session OT - End of Session Equipment Utilized During Treatment: Gait belt Activity Tolerance: Patient limited by fatigue Patient left: in chair;with call bell/phone within reach;with family/visitor present Nurse Communication: Mobility status    Key Cen,HILLARY 12/23/2011, 5:26 PM [facility], OTR/L  [phone] 12/23/2011

## 2011-12-23 NOTE — Progress Notes (Signed)
ANTICOAGULATION CONSULT NOTE - Follow Up Consult  Pharmacy Consult for Coumadin Indication: VTE prophylaxis  Allergies  Allergen Reactions  . Morphine And Related Nausea Only    Nausea   . Penicillins Other (See Comments)    seizures        Vital Signs: Temp: 98.4 F (36.9 C) (06/05 0518) BP: 146/60 mmHg (06/05 0518) Pulse Rate: 98  (06/05 0518)  Labs:  Basename 12/23/11 0600 12/22/11 0542 12/21/11 0800  HGB 10.0* 10.2* --  HCT 29.6* 30.1* --  PLT 183 184 --  APTT -- -- 27  LABPROT 15.1 14.6 13.1  INR 1.17 1.12 0.97  HEPARINUNFRC -- -- --  CREATININE 0.67 0.61 --  CKTOTAL -- -- --  CKMB -- -- --  TROPONINI -- -- --    The CrCl is unknown because both a height and weight (above a minimum accepted value) are required for this calculation.     Assessment: Patient 71 yo female s/p total right knee arthroplasty on 6/3 Coumadin 5 mg given 6/4  Today INR =1.17  Goal of Therapy:  INR 2-3 Monitor platelets by anticoagulation protocol: Yes   Plan:  Coumadin 5 mg po today  Daily pt/inr   Lucille Passy 12/23/2011,12:27 PM

## 2011-12-23 NOTE — Progress Notes (Signed)
PT Progress Note:     12/23/11 1300  PT Visit Information  Last PT Received On 12/23/11  Assistance Needed +1  PT Time Calculation  PT Start Time 0816  PT Stop Time 0847  PT Time Calculation (min) 31 min  Precautions  Precautions Knee;Fall  Required Braces or Orthoses Knee Immobilizer - Right  Knee Immobilizer - Right On when out of bed or walking  Restrictions  RLE Weight Bearing WBAT  Cognition  Overall Cognitive Status Appears within functional limits for tasks assessed/performed  Arousal/Alertness Awake/alert  Orientation Level Oriented X4 / Intact  Behavior During Session [facility] for tasks performed  Transfers  Transfers Sit to Stand;Stand to Sit  Sit to Stand 3: Mod assist;With upper extremity assist;With armrests;From chair/3-in-1  Stand to Sit 3: Mod assist;With upper extremity assist;With armrests;To chair/3-in-1  Details for Transfer Assistance Cues for hand placement, RLE positioning, use of UE's for controlled descnet, & safety.    Ambulation/Gait  Ambulation/Gait Assistance 4: Min guard  Ambulation Distance (Feet) 60 Feet  Assistive device Rolling walker  Ambulation/Gait Assistance Details Cues for tall posture, increased step length, encouragement to increase WBing through RLE, & encouragement to increase distance.  Pt c/o pain in knee & due to fibromyalgia.    Gait Pattern Step-to pattern;Decreased step length - left;Decreased step length - right;Decreased stance time - right  Gait velocity slow  Stairs No  Exercises  Exercises Total Joint  Total Joint Exercises  Ankle Circles/Pumps AROM;Both;Supine  Quad Sets AROM;Right;10 reps;Supine  Long Arc [place];Right;10 reps;Seated  Knee Flexion AAROM;Right;10 reps;Seated  PT - End of Session  Equipment Utilized During Treatment Gait belt;Right knee immobilizer  Activity Tolerance Patient limited by fatigue;Patient limited by pain  Patient left in chair;with call bell/phone within reach  PT - Assessment/Plan    Comments on Treatment Session Pt able to increase ambulation distance but requires increased time.  Pt will need to do 1 step next PT session before d/cing home.    PT Plan Discharge plan remains appropriate;Frequency remains appropriate  PT Frequency 7X/week  Follow Up Recommendations Home health PT;Supervision/Assistance - 24 hour  Equipment Recommended None recommended by SLP  Acute Rehab PT Goals  Time For Goal Achievement 12/29/11  Potential to Achieve Goals Good  PT Goal: Sit to Stand - Progress Progressing toward goal  PT Goal: Ambulate - Progress Progressing toward goal  PT Goal: Perform Home Exercise Program - Progress Progressing toward goal     Verdell Face, [place] [phone] 12/23/2011

## 2011-12-23 NOTE — Progress Notes (Signed)
Discharge teaching done with verbalized understanding.  Went home with family.

## 2011-12-31 ENCOUNTER — Other Ambulatory Visit: Payer: Medicare Other

## 2012-03-04 ENCOUNTER — Telehealth: Payer: Self-pay | Admitting: *Deleted

## 2012-03-04 ENCOUNTER — Ambulatory Visit (HOSPITAL_BASED_OUTPATIENT_CLINIC_OR_DEPARTMENT_OTHER): Payer: Medicare Other | Admitting: Family

## 2012-03-04 ENCOUNTER — Encounter: Payer: Self-pay | Admitting: Family

## 2012-03-04 ENCOUNTER — Other Ambulatory Visit (HOSPITAL_BASED_OUTPATIENT_CLINIC_OR_DEPARTMENT_OTHER): Payer: Medicare Other | Admitting: Lab

## 2012-03-04 VITALS — BP 116/69 | HR 78 | Temp 98.2°F | Resp 20 | Ht 67.0 in | Wt 224.0 lb

## 2012-03-04 DIAGNOSIS — I2699 Other pulmonary embolism without acute cor pulmonale: Secondary | ICD-10-CM

## 2012-03-04 DIAGNOSIS — C50919 Malignant neoplasm of unspecified site of unspecified female breast: Secondary | ICD-10-CM

## 2012-03-04 DIAGNOSIS — E559 Vitamin D deficiency, unspecified: Secondary | ICD-10-CM

## 2012-03-04 DIAGNOSIS — Z853 Personal history of malignant neoplasm of breast: Secondary | ICD-10-CM

## 2012-03-04 LAB — CBC WITH DIFFERENTIAL/PLATELET
BASO%: 1.4 % (ref 0.0–2.0)
Basophils Absolute: 0.1 10*3/uL (ref 0.0–0.1)
HCT: 36.5 % (ref 34.8–46.6)
HGB: 12.3 g/dL (ref 11.6–15.9)
MONO#: 0.5 10*3/uL (ref 0.1–0.9)
NEUT%: 68.8 % (ref 38.4–76.8)
RDW: 13.8 % (ref 11.2–14.5)
WBC: 6.3 10*3/uL (ref 3.9–10.3)
lymph#: 1.1 10*3/uL (ref 0.9–3.3)

## 2012-03-04 LAB — COMPREHENSIVE METABOLIC PANEL
ALT: 12 U/L (ref 0–35)
AST: 17 U/L (ref 0–37)
Albumin: 3.7 g/dL (ref 3.5–5.2)
BUN: 23 mg/dL (ref 6–23)
CO2: 24 mEq/L (ref 19–32)
Calcium: 8.6 mg/dL (ref 8.4–10.5)
Chloride: 107 mEq/L (ref 96–112)
Creatinine, Ser: 0.88 mg/dL (ref 0.50–1.10)
Potassium: 3.6 mEq/L (ref 3.5–5.3)

## 2012-03-04 LAB — VITAMIN D 25 HYDROXY (VIT D DEFICIENCY, FRACTURES): Vit D, 25-Hydroxy: 42 ng/mL (ref 30–89)

## 2012-03-04 NOTE — Progress Notes (Signed)
Hematology and Oncology Follow Up Visit  Lydia Campbell [id number] Jul 04, 1941 71 y.o. 03/04/2012 10:58 AM PCP dr Ladean Raya, [place]; dr Garth Bigness toth  Principle Diagnosis: T2 No left breast ca s/p mrm 03/28/08, on tamoxifen after multiple AI trials  complicated by PE.  Interim History:  No self identified breast problems of complaints. Mammogram in April 2013 showed no suspicious areas in the contralateral right breast. No self identified problems with retained right breast or left chest. Wears left prosthesis.   Remains on Coumadin for PE in August of last year. Has discontinued Tamoxifen since PE. Had left total knee replacement recently and had uneventful recovery. She will now begin plans to have right knee done. No headache or blurred vision. No cough or shortness of breath. No abdominal pain or new bone pain. Bowel and bladder function are normal. Appetite is good, with adequate fluid intake. Remainder of the 10 point  review of systems is negative.  Medications: I have reviewed the patient's current medications.  Allergies:  Allergies  Allergen Reactions  . Morphine And Related Nausea Only    Nausea   . Penicillins Other (See Comments)    seizures    Past Medical History, Surgical history, Social history, and Family History were reviewed and updated.  Physical Exam: Blood pressure 116/69, pulse 78, temperature 98.2 F (36.8 C), resp. rate 20, height 5\' 7"  (1.702 m), weight 224 lb (101.606 kg). ECOG: 0 General appearance: alert, cooperative and appears stated age Head: Normocephalic, without obvious abnormality, atraumatic Neck: no adenopathy, no carotid bruit, no JVD, supple, symmetrical, trachea midline and thyroid not enlarged, symmetric, no tenderness/mass/nodules Lymph nodes: Cervical, supraclavicular, and axillary nodes normal. Cardiac : regular rate and rhythm, no murmurs or gallops Pulmonary:clear to auscultation bilaterally and normal percussion  bilaterally BREAST EXAM: In the supine position, with the right arm over the head, the right nipple is everted. No periareolar edema or nipple discharge. No mass in any quadrant or subareolar region. No redness of the skin. No right axillary adenopathy. Right breast is larger than anticipated for frame. With the left arm over the head, the left breast is surgically absent. Mastectomy scar is flat with no nodularity. No redness of the skin. No left axillary adenopathy.  Abdomen:soft, non-tender; bowel sounds normal; no masses,  no organomegaly Extremities negative Neuro: alert, oriented, normal speech, no focal findings or movement disorder noted  Lab Results: Lab Results  Component Value Date   WBC 6.3 03/04/2012   HGB 12.3 03/04/2012   HCT 36.5 03/04/2012   MCV 97.9 03/04/2012   PLT 224 03/04/2012   Impression: 1. Remote history left breast cancer, no evidence of recurrence clinically or mammographically.  2. History pulmonary embolism Aug. 2012, while on Tamoxifen. Now on Coumadin, INR monitored by primary care. Takes 5 mg two sequential days, followed by 2.5 mg one day.  3. Recent right knee replacement, surgery and recovery uneventful.  4. Mammogram 11/02/11, no evidence of malignancy, right breast.   Plan: 1. Remain off Tamoxifen. Completed 4 years and derived most of the benefit.  2. Coumadin per primary care.  3. Return in 6 months to see Dr. Donnie Coffin.    Colman Cater, FNP 8/16/201310:58 AM

## 2012-03-04 NOTE — Patient Instructions (Addendum)
Stay off Tamoxifen until next visit with Dr. Donnie Coffin.  Return in 6 months with Dr. Donnie Coffin. Coumadin per primary care.

## 2012-03-04 NOTE — Telephone Encounter (Signed)
Gave patient appointment for 09-02-2012 at 11:30am

## 2012-06-08 ENCOUNTER — Ambulatory Visit (INDEPENDENT_AMBULATORY_CARE_PROVIDER_SITE_OTHER): Payer: Medicare Other | Admitting: General Surgery

## 2012-06-08 ENCOUNTER — Encounter (INDEPENDENT_AMBULATORY_CARE_PROVIDER_SITE_OTHER): Payer: Self-pay | Admitting: General Surgery

## 2012-06-08 VITALS — BP 128/82 | HR 74 | Temp 98.6°F | Resp 18 | Ht 67.5 in | Wt 236.8 lb

## 2012-06-08 DIAGNOSIS — C50919 Malignant neoplasm of unspecified site of unspecified female breast: Secondary | ICD-10-CM

## 2012-06-08 NOTE — Patient Instructions (Signed)
Continue regular self exams  

## 2012-06-08 NOTE — Progress Notes (Signed)
Subjective:     Patient ID: Lydia Campbell, female   DOB: [birth year], 71 y.o.   MRN: [id number]  HPI The patient is a 71 year old white female who is 4 years status post left mastectomy for a T2 N0 left breast cancer. In the last 6 months she had her right knee operated on and it is working much better for she is planning to have her left knee operated on in January. She is off tamoxifen because of a pulmonary embolus. She denies any chest pain. She denies any discharge from her nipple.  Review of Systems  Constitutional: Negative.   HENT: Negative.   Eyes: Negative.   Respiratory: Negative.   Cardiovascular: Negative.   Gastrointestinal: Negative.   Genitourinary: Negative.   Musculoskeletal: Positive for arthralgias.  Skin: Negative.   Neurological: Negative.   Hematological: Negative.   Psychiatric/Behavioral: Negative.        Objective:   Physical Exam  Constitutional: She is oriented to person, place, and time. She appears well-developed and well-nourished.  HENT:  Head: Normocephalic and atraumatic.  Eyes: Conjunctivae normal and EOM are normal. Pupils are equal, round, and reactive to light.  Neck: Normal range of motion. Neck supple.  Cardiovascular: Normal rate, regular rhythm and normal heart sounds.   Pulmonary/Chest: Effort normal and breath sounds normal.       There is no palpable mass of the left chest wall. There is no palpable mass of the right breast. There is no palpable axillary supraclavicular cervical lymphadenopathy.  Abdominal: Soft. Bowel sounds are normal. She exhibits no mass. There is no tenderness.  Musculoskeletal: Normal range of motion.  Lymphadenopathy:    She has no cervical adenopathy.  Neurological: She is alert and oriented to person, place, and time.  Skin: Skin is warm and dry.  Psychiatric: She has a normal mood and affect. Her behavior is normal.       Assessment:     4 years status post left mastectomy for breast cancer.      Plan:     At this point she will stay on tamoxifen until she sees Dr. Donnie Coffin again. She will continue to do regular self exams. We will plan to see her back in about 6 months.

## 2012-07-11 ENCOUNTER — Encounter (HOSPITAL_COMMUNITY): Payer: Self-pay

## 2012-07-14 ENCOUNTER — Other Ambulatory Visit: Payer: Self-pay | Admitting: Physician Assistant

## 2012-07-14 NOTE — H&P (Signed)
TOTAL KNEE ADMISSION H&P  Patient is being admitted for left total knee arthroplasty.  Subjective:  Chief Complaint:left knee pain.  HPI: Lydia Campbell, 71 y.o. female, has a history of pain and functional disability in the left knee due to arthritis and has failed non-surgical conservative treatments for greater than 12 weeks to includeNSAID's and/or analgesics, corticosteriod injections, viscosupplementation injections, flexibility and strengthening excercises, supervised PT with diminished ADL's post treatment, use of assistive devices, weight reduction as appropriate and activity modification.  Onset of symptoms was gradual, starting 8 years ago with gradually worsening course since that time. The patient noted prior procedures on the knee to include  arthroscopy and menisectomy on the left knee(s).  Patient currently rates pain in the left knee(s) at 8 out of 10 with activity. Patient has night pain, worsening of pain with activity and weight bearing, pain that interferes with activities of daily living, crepitus and joint swelling.  Patient has evidence of subchondral sclerosis, periarticular osteophytes and joint space narrowing by imaging studies.  There is no active infection.  Patient Active Problem List   Diagnosis Date Noted  . Blood loss anemia 12/22/2011  . Hypokalemia 12/22/2011  . GERD (gastroesophageal reflux disease)   . Arthritis   . Sleep apnea   . Hypertension   . Hx of blood clots   . Fibromyalgia   . Anxiety   . Right knee DJD   . Left knee DJD   . Breast cancer 06/09/2011  . MITRAL REGURGITATION 05/13/2009   Past Medical History  Diagnosis Date  . GERD (gastroesophageal reflux disease)   . Arthritis   . Asthma   . Blood transfusion   . Clotting disorder   . Sleep apnea   . Heart murmur   . Hypertension   . Hx of blood clots 8/12    lungs  . H/O hiatal hernia   . Seizures     from pcn  . Cancer     breast lft  . Fibromyalgia   . Anxiety   .  Right knee DJD   . Left knee DJD     Past Surgical History  Procedure Date  . Abdominal hysterectomy   . Tonsillectomy   . Breast surgery   . Knee cartilage surgery 2001 and 2009    bil  . Breast surgery 03-28-08    lft  . Mastectomy     lft  . Total knee arthroplasty 12/21/2011    Procedure: TOTAL KNEE ARTHROPLASTY;  Surgeon: Nilda Simmer, MD;  Location: [facility] OR;  Service: Orthopedics;  Laterality: Right;  DR Thurston Hole WANTS 90 MINUTES FOR THIS CASE     (Not in a hospital admission) Allergies  Allergen Reactions  . Morphine And Related Nausea Only    Nausea   . Penicillins Other (See Comments)    seizures    Current Outpatient Prescriptions on File Prior to Visit  Medication Sig Dispense Refill  . allopurinol (ZYLOPRIM) 300 MG tablet Take 300 mg by mouth daily.       . Ascorbic Acid (VITAMIN C) 1000 MG tablet Take 1,000 mg by mouth daily.       . B Complex-Biotin-FA (B-COMPLEX PO) Take 100 mg by mouth at bedtime.       . Biotin 5000 MCG CAPS Take 1 capsule by mouth daily.       . budesonide (PULMICORT) 0.25 MG/2ML nebulizer solution Take 0.25 mg by nebulization daily.      . calcium carbonate (CALCIUM 500)  1250 MG tablet Take 1 tablet by mouth daily.      . cetirizine (ZYRTEC) 10 MG tablet Take 10 mg by mouth daily.       . cholecalciferol (VITAMIN D) 400 UNITS TABS Take 400 Units by mouth daily.       . hydroxypropyl methylcellulose (ISOPTO TEARS) 2.5 % ophthalmic solution Place 1 drop into both eyes 4 (four) times daily as needed. Dry eyes      . MAGNESIUM PO Take 50 mg by mouth 3 (three) times daily.       . nabumetone (RELAFEN) 500 MG tablet Take 500 mg by mouth daily.       . NON FORMULARY Patient uses a C PAP machine      . omeprazole (PRILOSEC) 20 MG capsule Take 20 mg by mouth daily.       . pantoprazole (PROTONIX) 40 MG tablet Take 40 mg by mouth daily.       . Potassium Chloride Crys CR (KLOR-CON M20 PO) Take 1 tablet by mouth 2 (two) times daily.       . traZODone  (DESYREL) 50 MG tablet Take 50 mg by mouth at bedtime.       . triamterene-hydrochlorothiazide (MAXZIDE) 75-50 MG per tablet Take 1 tablet by mouth daily.       . metoCLOPramide (REGLAN) 10 MG tablet Take 1 tablet (10 mg total) by mouth every 8 (eight) hours as needed (nausea).  20 tablet  0  . pimecrolimus (ELIDEL) 1 % cream Apply 1 application topically daily.      Marland Kitchen warfarin (COUMADIN) 5 MG tablet Take 2.5-5 mg by mouth daily. Alternates every other day. Warfarin 5mg  due Monday 07/11/12 then 2.5mg  due Tuesday 07/12/12         History  Substance Use Topics  . Smoking status: Former Smoker -- 5 years    Types: Cigarettes    Quit date: 12/06/1968  . Smokeless tobacco: Never Used  . Alcohol Use: No    Family History  Problem Relation Age of Onset  . Heart disease Mother   . Diabetes Father   . Heart disease Father   . Heart disease Brother      Review of Systems  Constitutional: Negative.   HENT: Negative.   Eyes: Negative.   Respiratory: Negative.   Cardiovascular: Negative.   Gastrointestinal: Negative.   Musculoskeletal: Positive for joint pain.  Skin: Negative.   Neurological: Negative.   Endo/Heme/Allergies: Negative.   Psychiatric/Behavioral: Negative.     Objective:  Physical Exam  Constitutional: She is oriented to person, place, and time. She appears well-developed and well-nourished.  HENT:  Head: Normocephalic and atraumatic.  Eyes: Conjunctivae normal and EOM are normal. Pupils are equal, round, and reactive to light.  Neck: Normal range of motion.  Cardiovascular: Normal rate and regular rhythm.   Respiratory: Effort normal.  GI: Soft.  Genitourinary:       Not pertinent to current symptomatology therefore not examined.  Musculoskeletal:       Examination of her left knee reveals mild valgus deformity 1 to 2+ crepitation 1+ synovitis range of motion 0-120 degrees with diffuse pain and normal patella tracking. Exam of the right knee reveals well healed  total knee incision without swelling or pain, full range of motion knee is stable with normal patella tracking. Vascular exam: pulses 2+ and symmetric.  Neurological: She is alert and oriented to person, place, and time.  Skin: Skin is warm and dry.  Psychiatric: She has  a normal mood and affect. Her behavior is normal. Judgment and thought content normal.    Vital signs in last 24 hours: Last recorded: 12/26 1100   BP: 126/78 Pulse: 75  Temp: 98 F (36.7 C) Resp: 12  Height: 5\' 7"  (1.702 m) SpO2: 97  Weight: 99.791 kg (220 lb)     Labs:   Estimated Body mass index is 34.46 kg/(m^2) as calculated from the following:   Height as of this encounter: 5\' 7" (1.702 m).   Weight as of this encounter: 220 lb(99.791 kg).   Imaging Review Plain radiographs demonstrate severe degenerative joint disease of the left knee(s). The overall alignment issignificant valgus. The bone quality appears to be good for age and reported activity level.  Assessment/Plan:  End stage arthritis, left knee  Patient Active Problem List  Diagnosis  . MITRAL REGURGITATION  . Breast cancer  . GERD (gastroesophageal reflux disease)  . Arthritis  . Sleep apnea  . Hypertension  . Hx of blood clots  . Fibromyalgia  . Anxiety  . Right knee DJD  . Left knee DJD  . Blood loss anemia  . Hypokalemia   The patient history, physical examination, clinical judgment of the provider and imaging studies are consistent with end stage degenerative joint disease of the left knee(s) and total knee arthroplasty is deemed medically necessary. The treatment options including medical management, injection therapy arthroscopy and arthroplasty were discussed at length. The risks and benefits of total knee arthroplasty were presented and reviewed. The risks due to aseptic loosening, infection, stiffness, patella tracking problems, thromboembolic complications and other imponderables were discussed. The patient acknowledged the  explanation, agreed to proceed with the plan and consent was signed. Patient is being admitted for inpatient treatment for surgery, pain control, PT, OT, prophylactic antibiotics, VTE prophylaxis, progressive ambulation and ADL's and discharge planning. The patient is planning to be discharged home with home health services  Audris Speaker A. Gwinda Passe Physician Assistant [facility] [phone]  07/14/2012, 1:10 PM

## 2012-07-18 ENCOUNTER — Encounter (HOSPITAL_COMMUNITY): Payer: Self-pay

## 2012-07-18 ENCOUNTER — Encounter (HOSPITAL_COMMUNITY)
Admission: RE | Admit: 2012-07-18 | Discharge: 2012-07-18 | Disposition: A | Discharge status: Home / Self Care | Payer: Medicare Other | Source: Ambulatory Visit | Attending: Orthopedic Surgery | Admitting: Orthopedic Surgery

## 2012-07-18 LAB — SURGICAL PCR SCREEN: MRSA, PCR: NEGATIVE

## 2012-07-18 LAB — CBC WITH DIFFERENTIAL/PLATELET
Eosinophils Relative: 3 % (ref 0–5)
HCT: 43.3 % (ref 36.0–46.0)
Lymphocytes Relative: 15 % (ref 12–46)
Lymphs Abs: 1.5 10*3/uL (ref 0.7–4.0)
MCV: 94.5 fL (ref 78.0–100.0)
Platelets: 200 10*3/uL (ref 150–400)
RBC: 4.58 MIL/uL (ref 3.87–5.11)
WBC: 10 10*3/uL (ref 4.0–10.5)

## 2012-07-18 LAB — COMPREHENSIVE METABOLIC PANEL
ALT: 19 U/L (ref 0–35)
Alkaline Phosphatase: 64 U/L (ref 39–117)
CO2: 26 mEq/L (ref 19–32)
Calcium: 9.5 mg/dL (ref 8.4–10.5)
Chloride: 98 mEq/L (ref 96–112)
GFR calc Af Amer: 72 mL/min — ABNORMAL LOW (ref >90)
GFR calc non Af Amer: 62 mL/min — ABNORMAL LOW (ref >90)
Glucose, Bld: 80 mg/dL (ref 70–99)
Sodium: 139 mEq/L (ref 135–145)
Total Bilirubin: 0.2 mg/dL — ABNORMAL LOW (ref 0.3–1.2)

## 2012-07-18 LAB — TYPE AND SCREEN: ABO/RH(D): A NEG

## 2012-07-18 LAB — URINALYSIS, ROUTINE W REFLEX MICROSCOPIC
Glucose, UA: NEGATIVE mg/dL
Hgb urine dipstick: NEGATIVE
Leukocytes, UA: NEGATIVE
Specific Gravity, Urine: 1.015 (ref 1.005–1.030)
pH: 6 (ref 5.0–8.0)

## 2012-07-18 NOTE — Pre-Procedure Instructions (Addendum)
20 Lydia Campbell  07/18/2012   Your procedure is scheduled on:  Mon, Jan 6 @ 8:45 AM  Report to [facility] at 6:45 AM.  Call this number if you have problems the morning of surgery: [phone]   Remember:   Do not eat food or drink:After Midnight.  Take these medicines the morning of surgery with A SIP OF WATER: Allopurinol(Zyloprim),Pulmicort neb,Zyrtec(Cetirizine),Reglan(Metoclopramide),and Pantoprazole(Protonix) STOP nabumatone now  Coumadin per dr   Drucilla Schmidt not wear jewelry, make-up or nail polish.  Do not wear lotions, powders, or perfumes. You may wear deodorant.  Do not shave 48 hours prior to surgery.   Do not bring valuables to the hospital.  Contacts, dentures or bridgework may not be worn into surgery.  Leave suitcase in the car. After surgery it may be brought to your room.  For patients admitted to the hospital, checkout time is 11:00 AM the day of discharge.   Patients discharged the day of surgery will not be allowed to drive home.  dtr robin [phone]  Special Instructions: Shower using CHG 2 nights before surgery and the night before surgery.  If you shower the day of surgery use CHG.  Use special wash - you have one bottle of CHG for all showers.  You should use approximately 1/3 of the bottle for each shower.   Please read over the following fact sheets that you were given: Pain Booklet, Coughing and Deep Breathing, Blood Transfusion Information, Total Joint Packet, MRSA Information and Surgical Site Infection Prevention

## 2012-07-18 NOTE — Progress Notes (Signed)
Dr Thurston Hole called re:ted hose not large enough. Calf size 19.5 inches

## 2012-07-19 NOTE — Consult Note (Addendum)
Anesthesia Chart Review: Patient is a 71 year old female scheduled for left TKR.  She is s/p right TKR on 12/21/11. Other hiistory includes DVT and bilateral LL PE in August 2012 presumed secondary to Tamoxifen treatment for breast CA (s/p left radical mastectomy), OSA, murmur with mild MR by 2010 echo, GERD, hiatal hernia, asthma, HTN, fibromyalgia, anxiety, seizures (from PCN), blood transfusion. PCP is Dr. Caryl Asp. Pulmonologist is Dr. Genoveva Ill in [place]. Hematologist has been Dr. Donnie Coffin.   She has been evaluated by Cardiologist Dr. Diona Browner in the past for mild MR, and was seen by Joni Reining, NP on 11/05/11 for pre-operative evaluation prior to her right TKR. She was ultimately cleared for that procedure without additional cardiac testing.  EKG on 12/09/11 showed NSR.   Echo on 03/05/09 showed:  1. Left ventricle: The cavity size was normal. Systolic function was normal. The estimated ejection fraction was in the range of 60% to 65%. Wall motion was normal; there were no regional wall motion abnormalities. 2. Mitral valve: Mild regurgitation.  CXR on 12/09/11 showed no acute infiltrate or pulmonary edema. Again noted left basilar atelectasis or scarring.   Spirometry was WNL, lung volumes WNL, mild decrease in diffusing capacity on 05/04/11 [facility]). She had no oxygen desaturation on a six-minute walk test on 05/05/11.  (See June 2013 correspondence under Media tab.)  Pre-operative labs noted.  She held her Coumadin starting 07/14/12.  Repeat PT/PTT on arrival.  Urine culture is still pending.  According to Epic, she is scheduled for BLE venous dopplers on 07/21/12.  I've asked nursing staff to follow-up to ensure negative results.  She tolerated a right TKR approximately six months ago. Would anticipate she can proceed if venous duplex is negative and follow-up labs are reasonable.  Shonna Chock, PA-C 07/19/12 1159  Addendum: 07/22/12 0915 Urine culture showed 30,000  colonies of Proteus Mirabilis.  Results faxed to Dr. Sherene Sires office with confirmation and result called to [facility].  Venous duplex results from 07/21/12 are still pending.  Addendum: 07/22/12 1645 Venous duplex report still pending.  I called [facility] at [phone] and was told they would prioritize patient's duplex to be read before 07/25/12 @ 0830, although they would not guarantee that results would be scanned into Epic by that time.  I was told to ask for Phylicia if additional updates needed from [facility] lab.  When I called to update Kirstin Shepperson, PA-C she stated that she had received a verbal report that patient's venous duplex was negative.  She also called patient in antibiotics (Cipro, I believe) for her positive urine culture.

## 2012-07-21 ENCOUNTER — Encounter (INDEPENDENT_AMBULATORY_CARE_PROVIDER_SITE_OTHER): Payer: Medicare Other

## 2012-07-21 DIAGNOSIS — M79609 Pain in unspecified limb: Secondary | ICD-10-CM

## 2012-07-21 DIAGNOSIS — R609 Edema, unspecified: Secondary | ICD-10-CM

## 2012-07-21 LAB — URINE CULTURE: Colony Count: 30000

## 2012-07-24 MED ORDER — SODIUM CHLORIDE 0.9 % IV SOLN
1500.0000 mg | INTRAVENOUS | Status: AC
  Administered 2012-07-25: 1500 mg via INTRAVENOUS
  Filled 2012-07-24 (×2): qty 1500

## 2012-07-25 ENCOUNTER — Inpatient Hospital Stay (HOSPITAL_COMMUNITY)
Admission: RE | Admit: 2012-07-25 | Discharge: 2012-07-26 | DRG: 470 | Disposition: A | Discharge status: Home Health | Payer: Medicare Other | Source: Ambulatory Visit | Attending: Orthopedic Surgery | Admitting: Orthopedic Surgery

## 2012-07-25 ENCOUNTER — Encounter (HOSPITAL_COMMUNITY): Payer: Self-pay | Admitting: Vascular Surgery

## 2012-07-25 ENCOUNTER — Encounter (HOSPITAL_COMMUNITY): Payer: Self-pay | Admitting: *Deleted

## 2012-07-25 ENCOUNTER — Inpatient Hospital Stay (HOSPITAL_COMMUNITY): Payer: Medicare Other | Admitting: Vascular Surgery

## 2012-07-25 ENCOUNTER — Encounter (HOSPITAL_COMMUNITY): Payer: Self-pay | Admitting: Physician Assistant

## 2012-07-25 ENCOUNTER — Encounter (HOSPITAL_COMMUNITY)
Admission: RE | Disposition: A | Discharge status: Home Health | Payer: Self-pay | Source: Ambulatory Visit | Attending: Orthopedic Surgery

## 2012-07-25 DIAGNOSIS — Z87891 Personal history of nicotine dependence: Secondary | ICD-10-CM

## 2012-07-25 DIAGNOSIS — Z01812 Encounter for preprocedural laboratory examination: Secondary | ICD-10-CM

## 2012-07-25 DIAGNOSIS — B964 Proteus (mirabilis) (morganii) as the cause of diseases classified elsewhere: Secondary | ICD-10-CM | POA: Diagnosis present

## 2012-07-25 DIAGNOSIS — M7981 Nontraumatic hematoma of soft tissue: Secondary | ICD-10-CM | POA: Diagnosis present

## 2012-07-25 DIAGNOSIS — I059 Rheumatic mitral valve disease, unspecified: Secondary | ICD-10-CM | POA: Diagnosis present

## 2012-07-25 DIAGNOSIS — M109 Gout, unspecified: Secondary | ICD-10-CM | POA: Diagnosis present

## 2012-07-25 DIAGNOSIS — G4733 Obstructive sleep apnea (adult) (pediatric): Secondary | ICD-10-CM | POA: Diagnosis present

## 2012-07-25 DIAGNOSIS — D62 Acute posthemorrhagic anemia: Secondary | ICD-10-CM | POA: Diagnosis present

## 2012-07-25 DIAGNOSIS — M1990 Unspecified osteoarthritis, unspecified site: Secondary | ICD-10-CM | POA: Diagnosis present

## 2012-07-25 DIAGNOSIS — Z79899 Other long term (current) drug therapy: Secondary | ICD-10-CM

## 2012-07-25 DIAGNOSIS — G4730 Sleep apnea, unspecified: Secondary | ICD-10-CM | POA: Diagnosis present

## 2012-07-25 DIAGNOSIS — Z86711 Personal history of pulmonary embolism: Secondary | ICD-10-CM

## 2012-07-25 DIAGNOSIS — F419 Anxiety disorder, unspecified: Secondary | ICD-10-CM | POA: Diagnosis present

## 2012-07-25 DIAGNOSIS — I10 Essential (primary) hypertension: Secondary | ICD-10-CM | POA: Diagnosis present

## 2012-07-25 DIAGNOSIS — Z833 Family history of diabetes mellitus: Secondary | ICD-10-CM

## 2012-07-25 DIAGNOSIS — F411 Generalized anxiety disorder: Secondary | ICD-10-CM | POA: Diagnosis present

## 2012-07-25 DIAGNOSIS — IMO0001 Reserved for inherently not codable concepts without codable children: Secondary | ICD-10-CM | POA: Diagnosis present

## 2012-07-25 DIAGNOSIS — Z7901 Long term (current) use of anticoagulants: Secondary | ICD-10-CM

## 2012-07-25 DIAGNOSIS — M1711 Unilateral primary osteoarthritis, right knee: Secondary | ICD-10-CM | POA: Diagnosis present

## 2012-07-25 DIAGNOSIS — T148XXA Other injury of unspecified body region, initial encounter: Secondary | ICD-10-CM

## 2012-07-25 DIAGNOSIS — I080 Rheumatic disorders of both mitral and aortic valves: Secondary | ICD-10-CM | POA: Diagnosis present

## 2012-07-25 DIAGNOSIS — Z888 Allergy status to other drugs, medicaments and biological substances status: Secondary | ICD-10-CM

## 2012-07-25 DIAGNOSIS — E876 Hypokalemia: Secondary | ICD-10-CM | POA: Diagnosis present

## 2012-07-25 DIAGNOSIS — Z86718 Personal history of other venous thrombosis and embolism: Secondary | ICD-10-CM

## 2012-07-25 DIAGNOSIS — N390 Urinary tract infection, site not specified: Secondary | ICD-10-CM | POA: Diagnosis present

## 2012-07-25 DIAGNOSIS — M797 Fibromyalgia: Secondary | ICD-10-CM | POA: Diagnosis present

## 2012-07-25 DIAGNOSIS — Z853 Personal history of malignant neoplasm of breast: Secondary | ICD-10-CM

## 2012-07-25 DIAGNOSIS — Z8249 Family history of ischemic heart disease and other diseases of the circulatory system: Secondary | ICD-10-CM

## 2012-07-25 DIAGNOSIS — Z96659 Presence of unspecified artificial knee joint: Secondary | ICD-10-CM

## 2012-07-25 DIAGNOSIS — Z881 Allergy status to other antibiotic agents status: Secondary | ICD-10-CM

## 2012-07-25 DIAGNOSIS — Z9010 Acquired absence of unspecified breast and nipple: Secondary | ICD-10-CM

## 2012-07-25 DIAGNOSIS — K219 Gastro-esophageal reflux disease without esophagitis: Secondary | ICD-10-CM | POA: Diagnosis present

## 2012-07-25 DIAGNOSIS — J45909 Unspecified asthma, uncomplicated: Secondary | ICD-10-CM | POA: Diagnosis present

## 2012-07-25 DIAGNOSIS — M1710 Unilateral primary osteoarthritis, unspecified knee: Principal | ICD-10-CM | POA: Diagnosis present

## 2012-07-25 DIAGNOSIS — M1712 Unilateral primary osteoarthritis, left knee: Secondary | ICD-10-CM

## 2012-07-25 HISTORY — DX: Malignant neoplasm of unspecified site of unspecified female breast: C50.919

## 2012-07-25 HISTORY — DX: Other injury of unspecified body region, initial encounter: T14.8XXA

## 2012-07-25 HISTORY — DX: Gout, unspecified: M10.9

## 2012-07-25 HISTORY — DX: Dependence on other enabling machines and devices: Z99.89

## 2012-07-25 HISTORY — DX: Low back pain, unspecified: M54.50

## 2012-07-25 HISTORY — DX: Unspecified osteoarthritis, unspecified site: M19.90

## 2012-07-25 HISTORY — DX: Other chronic pain: G89.29

## 2012-07-25 HISTORY — DX: Obstructive sleep apnea (adult) (pediatric): G47.33

## 2012-07-25 HISTORY — PX: TOTAL KNEE ARTHROPLASTY WITH REVISION COMPONENTS: SHX6198

## 2012-07-25 HISTORY — DX: Other forms of dyspnea: R06.09

## 2012-07-25 HISTORY — DX: Other pulmonary embolism without acute cor pulmonale: I26.99

## 2012-07-25 HISTORY — DX: Low back pain: M54.5

## 2012-07-25 LAB — PROTIME-INR
INR: 1.01 (ref 0.00–1.49)
Prothrombin Time: 13.2 seconds (ref 11.6–15.2)

## 2012-07-25 LAB — GRAM STAIN

## 2012-07-25 LAB — CBC
HCT: 37.5 % (ref 36.0–46.0)
Hemoglobin: 12.2 g/dL (ref 12.0–15.0)
MCV: 95.7 fL (ref 78.0–100.0)
RBC: 3.92 MIL/uL (ref 3.87–5.11)
WBC: 8.8 10*3/uL (ref 4.0–10.5)

## 2012-07-25 LAB — CREATININE, SERUM: GFR calc Af Amer: >90 mL/min (ref >90)

## 2012-07-25 SURGERY — TOTAL KNEE ARTHROPLASTY WITH REVISION COMPONENTS
Anesthesia: General | Site: Knee | Laterality: Left | Wound class: Clean

## 2012-07-25 MED ORDER — ONDANSETRON HCL 4 MG/2ML IJ SOLN
INTRAMUSCULAR | Status: DC | PRN
  Administered 2012-07-25: 4 mg via INTRAVENOUS

## 2012-07-25 MED ORDER — HYDROMORPHONE HCL PF 1 MG/ML IJ SOLN
0.5000 mg | INTRAMUSCULAR | Status: DC | PRN
  Administered 2012-07-25: 1 mg via INTRAVENOUS
  Filled 2012-07-25: qty 1

## 2012-07-25 MED ORDER — POLYVINYL ALCOHOL 1.4 % OP SOLN
1.0000 [drp] | Freq: Four times a day (QID) | OPHTHALMIC | Status: DC | PRN
  Filled 2012-07-25: qty 15

## 2012-07-25 MED ORDER — VITAMIN C 500 MG PO TABS
1000.0000 mg | ORAL_TABLET | Freq: Every day | ORAL | Status: DC
  Administered 2012-07-26: 1000 mg via ORAL
  Filled 2012-07-25 (×2): qty 2

## 2012-07-25 MED ORDER — PIMECROLIMUS 1 % EX CREA
1.0000 "application " | TOPICAL_CREAM | Freq: Every day | CUTANEOUS | Status: DC
  Administered 2012-07-25 – 2012-07-26 (×2): 1 via TOPICAL

## 2012-07-25 MED ORDER — OXYCODONE HCL 5 MG PO TABS: 5.0000 mg | ORAL_TABLET | Freq: Once | ORAL | Status: DC | PRN

## 2012-07-25 MED ORDER — LACTATED RINGERS IV SOLN: INTRAVENOUS | Status: DC

## 2012-07-25 MED ORDER — MIDAZOLAM HCL 2 MG/2ML IJ SOLN: 1.0000 mg | INTRAMUSCULAR | Status: DC | PRN

## 2012-07-25 MED ORDER — FENTANYL CITRATE 0.05 MG/ML IJ SOLN: 50.0000 ug | Freq: Once | INTRAMUSCULAR | Status: DC

## 2012-07-25 MED ORDER — METOCLOPRAMIDE HCL 5 MG/ML IJ SOLN: 5.0000 mg | Freq: Three times a day (TID) | INTRAMUSCULAR | Status: DC | PRN

## 2012-07-25 MED ORDER — ARTIFICIAL TEARS OP OINT
TOPICAL_OINTMENT | OPHTHALMIC | Status: DC | PRN
  Administered 2012-07-25: 1 via OPHTHALMIC

## 2012-07-25 MED ORDER — BUDESONIDE 0.25 MG/2ML IN SUSP
0.2500 mg | Freq: Every day | RESPIRATORY_TRACT | Status: DC
  Administered 2012-07-26: 0.25 mg via RESPIRATORY_TRACT
  Filled 2012-07-25: qty 2

## 2012-07-25 MED ORDER — ENOXAPARIN SODIUM 30 MG/0.3ML ~~LOC~~ SOLN
30.0000 mg | Freq: Two times a day (BID) | SUBCUTANEOUS | Status: DC
  Administered 2012-07-26: 30 mg via SUBCUTANEOUS
  Filled 2012-07-25 (×3): qty 0.3

## 2012-07-25 MED ORDER — SODIUM CHLORIDE 0.9 % IR SOLN
Status: DC | PRN
  Administered 2012-07-25: 3000 mL

## 2012-07-25 MED ORDER — LORATADINE 10 MG PO TABS
10.0000 mg | ORAL_TABLET | Freq: Every day | ORAL | Status: DC
  Administered 2012-07-26: 10 mg via ORAL
  Filled 2012-07-25: qty 1

## 2012-07-25 MED ORDER — ACETAMINOPHEN 10 MG/ML IV SOLN
1000.0000 mg | Freq: Once | INTRAVENOUS | Status: AC
  Administered 2012-07-25: 1000 mg via INTRAVENOUS
  Filled 2012-07-25: qty 100

## 2012-07-25 MED ORDER — ACETAMINOPHEN 10 MG/ML IV SOLN
1000.0000 mg | Freq: Four times a day (QID) | INTRAVENOUS | Status: DC
  Administered 2012-07-25 – 2012-07-26 (×3): 1000 mg via INTRAVENOUS
  Filled 2012-07-25 (×4): qty 100

## 2012-07-25 MED ORDER — FENTANYL CITRATE 0.05 MG/ML IJ SOLN
INTRAMUSCULAR | Status: DC | PRN
  Administered 2012-07-25 (×5): 25 ug via INTRAVENOUS
  Administered 2012-07-25: 50 ug via INTRAVENOUS
  Administered 2012-07-25 (×2): 25 ug via INTRAVENOUS
  Administered 2012-07-25 (×2): 50 ug via INTRAVENOUS
  Administered 2012-07-25 (×3): 25 ug via INTRAVENOUS

## 2012-07-25 MED ORDER — DEXAMETHASONE SODIUM PHOSPHATE 10 MG/ML IJ SOLN
10.0000 mg | Freq: Every day | INTRAMUSCULAR | Status: DC
  Filled 2012-07-25 (×2): qty 1

## 2012-07-25 MED ORDER — WARFARIN - PHARMACIST DOSING INPATIENT
Freq: Every day | Status: DC
  Administered 2012-07-25: 18:00:00

## 2012-07-25 MED ORDER — POTASSIUM CHLORIDE IN NACL 20-0.9 MEQ/L-% IV SOLN
INTRAVENOUS | Status: DC
  Administered 2012-07-25 – 2012-07-26 (×2): via INTRAVENOUS
  Filled 2012-07-25 (×4): qty 1000

## 2012-07-25 MED ORDER — METOCLOPRAMIDE HCL 10 MG PO TABS: 10.0000 mg | ORAL_TABLET | Freq: Three times a day (TID) | ORAL | Status: DC | PRN

## 2012-07-25 MED ORDER — CELECOXIB 200 MG PO CAPS
200.0000 mg | ORAL_CAPSULE | Freq: Two times a day (BID) | ORAL | Status: DC
  Administered 2012-07-25 – 2012-07-26 (×3): 200 mg via ORAL
  Filled 2012-07-25 (×4): qty 1

## 2012-07-25 MED ORDER — HYPROMELLOSE (GONIOSCOPIC) 2.5 % OP SOLN
1.0000 [drp] | Freq: Four times a day (QID) | OPHTHALMIC | Status: DC | PRN
  Filled 2012-07-25: qty 15

## 2012-07-25 MED ORDER — ZOLPIDEM TARTRATE 5 MG PO TABS: 5.0000 mg | ORAL_TABLET | Freq: Every evening | ORAL | Status: DC | PRN

## 2012-07-25 MED ORDER — DOCUSATE SODIUM 100 MG PO CAPS
100.0000 mg | ORAL_CAPSULE | Freq: Two times a day (BID) | ORAL | Status: DC
  Administered 2012-07-25 – 2012-07-26 (×3): 100 mg via ORAL
  Filled 2012-07-25 (×4): qty 1

## 2012-07-25 MED ORDER — VITAMIN C 500 MG PO TABS: 1000.0000 mg | ORAL_TABLET | Freq: Every day | ORAL | Status: DC

## 2012-07-25 MED ORDER — ROCURONIUM BROMIDE 100 MG/10ML IV SOLN
INTRAVENOUS | Status: DC | PRN
  Administered 2012-07-25: 50 mg via INTRAVENOUS

## 2012-07-25 MED ORDER — MENTHOL 3 MG MT LOZG: 1.0000 | LOZENGE | OROMUCOSAL | Status: DC | PRN

## 2012-07-25 MED ORDER — LIDOCAINE HCL (CARDIAC) 20 MG/ML IV SOLN
INTRAVENOUS | Status: DC | PRN
  Administered 2012-07-25: 80 mg via INTRAVENOUS

## 2012-07-25 MED ORDER — PANTOPRAZOLE SODIUM 40 MG PO TBEC
40.0000 mg | DELAYED_RELEASE_TABLET | Freq: Two times a day (BID) | ORAL | Status: DC
  Administered 2012-07-25 – 2012-07-26 (×2): 40 mg via ORAL
  Filled 2012-07-25 (×2): qty 1

## 2012-07-25 MED ORDER — POTASSIUM CHLORIDE CRYS ER 20 MEQ PO TBCR
20.0000 meq | EXTENDED_RELEASE_TABLET | Freq: Every day | ORAL | Status: DC
  Administered 2012-07-25 – 2012-07-26 (×2): 20 meq via ORAL
  Filled 2012-07-25 (×2): qty 1

## 2012-07-25 MED ORDER — CHLORHEXIDINE GLUCONATE 4 % EX LIQD: 60.0000 mL | Freq: Once | CUTANEOUS | Status: DC

## 2012-07-25 MED ORDER — LACTATED RINGERS IV SOLN
INTRAVENOUS | Status: DC | PRN
  Administered 2012-07-25 (×2): via INTRAVENOUS

## 2012-07-25 MED ORDER — CIPROFLOXACIN IN D5W 400 MG/200ML IV SOLN
400.0000 mg | Freq: Two times a day (BID) | INTRAVENOUS | Status: DC
  Administered 2012-07-25 – 2012-07-26 (×2): 400 mg via INTRAVENOUS
  Filled 2012-07-25 (×3): qty 200

## 2012-07-25 MED ORDER — TOBRAMYCIN SULFATE 1.2 G IJ SOLR
INTRAMUSCULAR | Status: DC | PRN
  Administered 2012-07-25: 1.2 g

## 2012-07-25 MED ORDER — TRAZODONE HCL 50 MG PO TABS
50.0000 mg | ORAL_TABLET | Freq: Every day | ORAL | Status: DC
  Administered 2012-07-25: 50 mg via ORAL
  Filled 2012-07-25 (×3): qty 1

## 2012-07-25 MED ORDER — MIDAZOLAM HCL 5 MG/5ML IJ SOLN
INTRAMUSCULAR | Status: DC | PRN
  Administered 2012-07-25: 2 mg via INTRAVENOUS

## 2012-07-25 MED ORDER — FENTANYL CITRATE 0.05 MG/ML IJ SOLN
INTRAMUSCULAR | Status: AC
  Filled 2012-07-25: qty 2

## 2012-07-25 MED ORDER — ACETAMINOPHEN 325 MG PO TABS: 650.0000 mg | ORAL_TABLET | Freq: Four times a day (QID) | ORAL | Status: DC | PRN

## 2012-07-25 MED ORDER — BISACODYL 5 MG PO TBEC
10.0000 mg | DELAYED_RELEASE_TABLET | Freq: Every day | ORAL | Status: DC
  Administered 2012-07-25 – 2012-07-26 (×2): 10 mg via ORAL
  Filled 2012-07-25 (×2): qty 2

## 2012-07-25 MED ORDER — TOBRAMYCIN SULFATE 1.2 G IJ SOLR
INTRAMUSCULAR | Status: AC
  Filled 2012-07-25: qty 1.2

## 2012-07-25 MED ORDER — BUPIVACAINE-EPINEPHRINE PF 0.5-1:200000 % IJ SOLN
INTRAMUSCULAR | Status: DC | PRN
  Administered 2012-07-25: 25 mL

## 2012-07-25 MED ORDER — DEXAMETHASONE 6 MG PO TABS
10.0000 mg | ORAL_TABLET | Freq: Every day | ORAL | Status: DC
  Administered 2012-07-25 – 2012-07-26 (×2): 10 mg via ORAL
  Filled 2012-07-25 (×2): qty 1

## 2012-07-25 MED ORDER — ACETAMINOPHEN 650 MG RE SUPP: 650.0000 mg | Freq: Four times a day (QID) | RECTAL | Status: DC | PRN

## 2012-07-25 MED ORDER — PROMETHAZINE HCL 25 MG/ML IJ SOLN: 6.2500 mg | INTRAMUSCULAR | Status: DC | PRN

## 2012-07-25 MED ORDER — BUPIVACAINE-EPINEPHRINE 0.25% -1:200000 IJ SOLN
INTRAMUSCULAR | Status: AC
  Filled 2012-07-25: qty 1

## 2012-07-25 MED ORDER — TRIAMTERENE 50 MG PO CAPS
50.0000 mg | ORAL_CAPSULE | Freq: Every day | ORAL | Status: DC
  Administered 2012-07-26: 50 mg via ORAL
  Filled 2012-07-25 (×2): qty 1

## 2012-07-25 MED ORDER — PHENOL 1.4 % MT LIQD: 1.0000 | OROMUCOSAL | Status: DC | PRN

## 2012-07-25 MED ORDER — ALUM & MAG HYDROXIDE-SIMETH 200-200-20 MG/5ML PO SUSP: 30.0000 mL | ORAL | Status: DC | PRN

## 2012-07-25 MED ORDER — MAGNESIUM 65 MG PO TABS: 50.0000 mg | ORAL_TABLET | Freq: Three times a day (TID) | ORAL | Status: DC

## 2012-07-25 MED ORDER — FENTANYL CITRATE 0.05 MG/ML IJ SOLN
25.0000 ug | INTRAMUSCULAR | Status: DC | PRN
  Administered 2012-07-25 (×2): 25 ug via INTRAVENOUS
  Administered 2012-07-25: 50 ug via INTRAVENOUS

## 2012-07-25 MED ORDER — WARFARIN SODIUM 5 MG PO TABS
5.0000 mg | ORAL_TABLET | Freq: Once | ORAL | Status: AC
  Administered 2012-07-25: 5 mg via ORAL
  Filled 2012-07-25: qty 1

## 2012-07-25 MED ORDER — DIPHENHYDRAMINE HCL 12.5 MG/5ML PO ELIX: 12.5000 mg | ORAL_SOLUTION | ORAL | Status: DC | PRN

## 2012-07-25 MED ORDER — DEXAMETHASONE SODIUM PHOSPHATE 4 MG/ML IJ SOLN
INTRAMUSCULAR | Status: DC | PRN
  Administered 2012-07-25: 4 mg

## 2012-07-25 MED ORDER — ACETAMINOPHEN 10 MG/ML IV SOLN
INTRAVENOUS | Status: AC
  Filled 2012-07-25: qty 100

## 2012-07-25 MED ORDER — ALLOPURINOL 300 MG PO TABS
300.0000 mg | ORAL_TABLET | Freq: Every day | ORAL | Status: DC
  Administered 2012-07-26: 300 mg via ORAL
  Filled 2012-07-25: qty 1

## 2012-07-25 MED ORDER — OXYCODONE HCL 5 MG/5ML PO SOLN: 5.0000 mg | Freq: Once | ORAL | Status: DC | PRN

## 2012-07-25 MED ORDER — METOCLOPRAMIDE HCL 10 MG PO TABS: 5.0000 mg | ORAL_TABLET | Freq: Three times a day (TID) | ORAL | Status: DC | PRN

## 2012-07-25 MED ORDER — ONDANSETRON HCL 4 MG/2ML IJ SOLN: 4.0000 mg | Freq: Four times a day (QID) | INTRAMUSCULAR | Status: DC | PRN

## 2012-07-25 MED ORDER — ONDANSETRON HCL 4 MG PO TABS: 4.0000 mg | ORAL_TABLET | Freq: Four times a day (QID) | ORAL | Status: DC | PRN

## 2012-07-25 MED ORDER — CALCIUM CARBONATE 1250 (500 CA) MG PO TABS
1.0000 | ORAL_TABLET | Freq: Every day | ORAL | Status: DC
  Administered 2012-07-26: 500 mg via ORAL
  Filled 2012-07-25 (×2): qty 1

## 2012-07-25 MED ORDER — VANCOMYCIN HCL IN DEXTROSE 1-5 GM/200ML-% IV SOLN
1000.0000 mg | Freq: Two times a day (BID) | INTRAVENOUS | Status: DC
  Administered 2012-07-25 – 2012-07-26 (×2): 1000 mg via INTRAVENOUS
  Filled 2012-07-25 (×3): qty 200

## 2012-07-25 MED ORDER — OXYCODONE HCL 5 MG PO TABS: 5.0000 mg | ORAL_TABLET | ORAL | Status: DC | PRN

## 2012-07-25 MED ORDER — CHOLECALCIFEROL 10 MCG (400 UNIT) PO TABS
400.0000 [IU] | ORAL_TABLET | Freq: Every day | ORAL | Status: DC
  Administered 2012-07-26: 400 [IU] via ORAL
  Filled 2012-07-25 (×2): qty 1

## 2012-07-25 MED ORDER — POVIDONE-IODINE 7.5 % EX SOLN
Freq: Once | CUTANEOUS | Status: DC
  Filled 2012-07-25: qty 118

## 2012-07-25 MED ORDER — PROPOFOL 10 MG/ML IV BOLUS
INTRAVENOUS | Status: DC | PRN
  Administered 2012-07-25: 140 mg via INTRAVENOUS

## 2012-07-25 SURGICAL SUPPLY — 73 items
BANDAGE ELASTIC 6 VELCRO ST LF (GAUZE/BANDAGES/DRESSINGS) ×3 IMPLANT
BANDAGE ESMARK 6X9 LF (GAUZE/BANDAGES/DRESSINGS) ×2 IMPLANT
BLADE SAGITTAL 25.0X1.19X90 (BLADE) ×3 IMPLANT
BLADE SAW SGTL 11.0X1.19X90.0M (BLADE) IMPLANT
BLADE SAW SGTL 13.0X1.19X90.0M (BLADE) ×3 IMPLANT
BLADE SURG 10 STRL SS (BLADE) ×6 IMPLANT
BNDG CMPR 9X6 STRL LF SNTH (GAUZE/BANDAGES/DRESSINGS) ×2
BNDG CMPR MED 15X6 ELC VLCR LF (GAUZE/BANDAGES/DRESSINGS) ×2
BNDG ELASTIC 6X15 VLCR STRL LF (GAUZE/BANDAGES/DRESSINGS) ×3 IMPLANT
BNDG ESMARK 6X9 LF (GAUZE/BANDAGES/DRESSINGS) ×3
BOWL SMART MIX CTS (DISPOSABLE) ×3 IMPLANT
CAP UPCHARGE REVISION TRAY ×3 IMPLANT
CEMENT HV SMART SET (Cement) ×6 IMPLANT
CEMENT TIBIA MBT SIZE 4 (Knees) IMPLANT
CLOTH BEACON ORANGE TIMEOUT ST (SAFETY) ×3 IMPLANT
COVER BACK TABLE 24X17X13 BIG (DRAPES) IMPLANT
COVER PROBE W GEL 5X96 (DRAPES) ×3 IMPLANT
COVER SURGICAL LIGHT HANDLE (MISCELLANEOUS) ×3 IMPLANT
CUFF TOURNIQUET SINGLE 34IN LL (TOURNIQUET CUFF) ×3 IMPLANT
CUFF TOURNIQUET SINGLE 44IN (TOURNIQUET CUFF) IMPLANT
DRAPE EXTREMITY T 121X128X90 (DRAPE) ×3 IMPLANT
DRAPE INCISE IOBAN 66X45 STRL (DRAPES) ×3 IMPLANT
DRAPE PROXIMA HALF (DRAPES) ×3 IMPLANT
DRAPE U-SHAPE 47X51 STRL (DRAPES) ×3 IMPLANT
DRSG ADAPTIC 3X8 NADH LF (GAUZE/BANDAGES/DRESSINGS) ×3 IMPLANT
DRSG PAD ABDOMINAL 8X10 ST (GAUZE/BANDAGES/DRESSINGS) ×3 IMPLANT
DURAPREP 26ML APPLICATOR (WOUND CARE) ×3 IMPLANT
ELECT CAUTERY BLADE 6.4 (BLADE) ×3 IMPLANT
ELECT REM PT RETURN 9FT ADLT (ELECTROSURGICAL) ×3
ELECTRODE REM PT RTRN 9FT ADLT (ELECTROSURGICAL) ×2 IMPLANT
EVACUATOR 1/8 PVC DRAIN (DRAIN) IMPLANT
FACESHIELD LNG OPTICON STERILE (SAFETY) ×3 IMPLANT
GLOVE BIO SURGEON STRL SZ7 (GLOVE) ×3 IMPLANT
GLOVE BIOGEL PI IND STRL 7.0 (GLOVE) ×2 IMPLANT
GLOVE BIOGEL PI IND STRL 7.5 (GLOVE) ×2 IMPLANT
GLOVE BIOGEL PI INDICATOR 7.0 (GLOVE) ×1
GLOVE BIOGEL PI INDICATOR 7.5 (GLOVE) ×1
GLOVE SS BIOGEL STRL SZ 7.5 (GLOVE) ×2 IMPLANT
GLOVE SUPERSENSE BIOGEL SZ 7.5 (GLOVE) ×1
GOWN PREVENTION PLUS XLARGE (GOWN DISPOSABLE) ×6 IMPLANT
GOWN STRL NON-REIN LRG LVL3 (GOWN DISPOSABLE) ×6 IMPLANT
HANDPIECE INTERPULSE COAX TIP (DISPOSABLE) ×3
HOOD PEEL AWAY FACE SHEILD DIS (HOOD) ×6 IMPLANT
IMMOBILIZER KNEE 22 UNIV (SOFTGOODS) IMPLANT
INSERT CUSHION PRONEVIEW LG (MISCELLANEOUS) ×3 IMPLANT
KIT BASIN OR (CUSTOM PROCEDURE TRAY) ×3 IMPLANT
KIT ROOM TURNOVER OR (KITS) ×3 IMPLANT
MANIFOLD NEPTUNE II (INSTRUMENTS) ×3 IMPLANT
NS IRRIG 1000ML POUR BTL (IV SOLUTION) ×3 IMPLANT
PACK TOTAL JOINT (CUSTOM PROCEDURE TRAY) ×3 IMPLANT
PAD ARMBOARD 7.5X6 YLW CONV (MISCELLANEOUS) ×6 IMPLANT
PAD CAST 4YDX4 CTTN HI CHSV (CAST SUPPLIES) ×2 IMPLANT
PADDING CAST COTTON 4X4 STRL (CAST SUPPLIES) ×3
PADDING CAST COTTON 6X4 STRL (CAST SUPPLIES) ×3 IMPLANT
POSITIONER HEAD PRONE TRACH (MISCELLANEOUS) ×3 IMPLANT
RUBBERBAND STERILE (MISCELLANEOUS) ×3 IMPLANT
SET HNDPC FAN SPRY TIP SCT (DISPOSABLE) ×2 IMPLANT
SPONGE GAUZE 4X4 12PLY (GAUZE/BANDAGES/DRESSINGS) ×3 IMPLANT
STEM TIBIA PFC 13X30MM (Stem) ×3 IMPLANT
STRIP CLOSURE SKIN 1/2X4 (GAUZE/BANDAGES/DRESSINGS) ×3 IMPLANT
SUCTION FRAZIER TIP 10 FR DISP (SUCTIONS) ×3 IMPLANT
SUT ETHIBOND NAB CT1 #1 30IN (SUTURE) ×6 IMPLANT
SUT MNCRL AB 3-0 PS2 18 (SUTURE) ×3 IMPLANT
SUT VIC AB 0 CT1 27 (SUTURE) ×4
SUT VIC AB 0 CT1 27XBRD ANBCTR (SUTURE) ×4 IMPLANT
SUT VIC AB 2-0 CT1 27 (SUTURE) ×6
SUT VIC AB 2-0 CT1 TAPERPNT 27 (SUTURE) ×4 IMPLANT
SYR 30ML SLIP (SYRINGE) ×3 IMPLANT
TIBIA MBT CEMENT SIZE 4 (Knees) IMPLANT
TOWEL OR 17X24 6PK STRL BLUE (TOWEL DISPOSABLE) ×3 IMPLANT
TOWEL OR 17X26 10 PK STRL BLUE (TOWEL DISPOSABLE) ×3 IMPLANT
TRAY FOLEY CATH 14FR (SET/KITS/TRAYS/PACK) ×3 IMPLANT
WATER STERILE IRR 1000ML POUR (IV SOLUTION) ×9 IMPLANT

## 2012-07-25 NOTE — Anesthesia Preprocedure Evaluation (Signed)
Anesthesia Evaluation  Patient identified by MRN, date of birth, ID band Patient awake    Reviewed: Allergy & Precautions, H&P , NPO status , Patient's Chart, lab work & pertinent test results  Airway Mallampati: II TM Distance: >3 FB Neck ROM: Full    Dental  (+) Dental Advisory Given, Caps and Teeth Intact   Pulmonary asthma , sleep apnea and Continuous Positive Airway Pressure Ventilation , former smoker,  breath sounds clear to auscultation  Pulmonary exam normal       Cardiovascular hypertension, Pt. on medications + Valvular Problems/Murmurs MR Rhythm:Regular Rate:Normal + Systolic murmurs    Neuro/Psych Seizures -,  Anxiety    GI/Hepatic Neg liver ROS, GERD-  Medicated and Controlled,  Endo/Other  negative endocrine ROS  Renal/GU negative Renal ROS     Musculoskeletal  (+) Arthritis -, Osteoarthritis,  Fibromyalgia -  Abdominal   Peds  Hematology negative hematology ROS (+)   Anesthesia Other Findings Full upper Bridge  Reproductive/Obstetrics                           Anesthesia Physical Anesthesia Plan  ASA: III  Anesthesia Plan: General   Post-op Pain Management:    Induction: Intravenous  Airway Management Planned: Oral ETT  Additional Equipment:   Intra-op Plan:   Post-operative Plan: Extubation in OR  Informed Consent: I have reviewed the patients History and Physical, chart, labs and discussed the procedure including the risks, benefits and alternatives for the proposed anesthesia with the patient or authorized representative who has indicated his/her understanding and acceptance.     Plan Discussed with: CRNA and Surgeon  Anesthesia Plan Comments:         Anesthesia Quick Evaluation

## 2012-07-25 NOTE — Progress Notes (Signed)
UR COMPLETED  

## 2012-07-25 NOTE — Evaluation (Signed)
Physical Therapy Evaluation Patient Details Name: Lydia Campbell MRN: [id number] DOB: Dec 30, 1940 Today's Date: 07/25/2012 Time: 4010-2725 PT Time Calculation (min): 31 min  PT Assessment / Plan / Recommendation Clinical Impression  Patient is a 72 yo female s/p Lt. TKA.  Patient will benefit from acute PT to maximize independence prior to return home with family.  Recommend HHPT.    PT Assessment  Patient needs continued PT services    Follow Up Recommendations  Home health PT;Supervision/Assistance - 24 hour    Does the patient have the potential to tolerate intense rehabilitation      Barriers to Discharge None      Equipment Recommendations  None recommended by PT    Recommendations for Other Services     Frequency 7X/week    Precautions / Restrictions Precautions Precautions: Knee Precaution Booklet Issued: Yes (comment) Precaution Comments: Reviewed precautions with patient. Required Braces or Orthoses: Knee Immobilizer - Left Knee Immobilizer - Left: On except when in CPM;Discontinue once straight leg raise with < 10 degree lag Restrictions Weight Bearing Restrictions: Yes LLE Weight Bearing: Weight bearing as tolerated   Pertinent Vitals/Pain       Mobility  Bed Mobility Bed Mobility: Supine to Sit;Sitting - Scoot to Edge of Bed Supine to Sit: 4: Min assist;With rails;HOB flat Sitting - Scoot to Edge of Bed: 4: Min guard;With rail Details for Bed Mobility Assistance: Verbal cues for technique. Transfers Transfers: Sit to Stand;Stand to [employer] Transfers Sit to Stand: 3: Mod assist;With upper extremity assist;From bed Stand to Sit: 4: Min assist;With upper extremity assist;With armrests;To chair/3-in-1 Stand Pivot Transfers: 3: Mod assist Details for Transfer Assistance: Verbal cues for technique, safe hand placement. Patient able to take 2-3 steps to pivot to chair. Ambulation/Gait Ambulation/Gait Assistance: Not tested (comment)         Exercises Total Joint Exercises Ankle Circles/Pumps: AROM;Both;10 reps;Seated   PT Diagnosis: Difficulty walking;Acute pain  PT Problem List: Decreased strength;Decreased range of motion;Decreased activity tolerance;Decreased mobility;Decreased balance;Decreased knowledge of use of DME;Decreased knowledge of precautions;Pain;Obesity PT Treatment Interventions: DME instruction;Gait training;Stair training;Functional mobility training;Therapeutic exercise;Patient/family education   PT Goals Acute Rehab PT Goals PT Goal Formulation: With patient Time For Goal Achievement: 08/01/12 Potential to Achieve Goals: Good Pt will go Supine/Side to Sit: Independently;with HOB 0 degrees PT Goal: Supine/Side to Sit - Progress: Goal set today Pt will go Sit to Stand: with supervision;with upper extremity assist PT Goal: Sit to Stand - Progress: Goal set today Pt will Ambulate: 51 - 150 feet;with supervision;with rolling walker PT Goal: Ambulate - Progress: Goal set today Pt will Go Up / Down Stairs: 1-2 stairs;with min assist;with least restrictive assistive device PT Goal: Up/Down Stairs - Progress: Goal set today Pt will Perform Home Exercise Program: with supervision, verbal cues required/provided PT Goal: Perform Home Exercise Program - Progress: Goal set today  Visit Information  Last PT Received On: 07/25/12 Assistance Needed: +2    Subjective Data  Subjective: "I'm doing much better with this knee than the last" Patient Stated Goal: To go home soon   Prior Functioning  Home Living Lives With: Spouse Available Help at Discharge: Family;Available 24 hours/day (Daughter staying with patient at d/c) Type of Home: House Home Access: Stairs to enter [employer] of Steps: 1 Entrance Stairs-Rails: None Home Layout: Two level;Able to live on main level with bedroom/bathroom;Laundry or work area in basement [employer] Shower/Tub: Engineer, manufacturing systems: Standard Bathroom  Accessibility: Yes How Accessible: Accessible via walker Home Adaptive  Equipment: Bedside commode/3-in-1;Shower chair with back;Walker - rolling;Straight cane Prior Function Level of Independence: Independent Able to Take Stairs?: Yes Driving: Yes Vocation: Retired Musician: No difficulties    Cognition  Overall Cognitive Status: Appears within functional limits for tasks assessed/performed Arousal/Alertness: Awake/alert Orientation Level: Oriented X4 / Intact Behavior During Session: [facility] for tasks performed    Extremity/Trunk Assessment Right Upper Extremity Assessment RUE ROM/Strength/Tone: WFL for tasks assessed RUE Sensation: WFL - Light Touch Left Upper Extremity Assessment LUE ROM/Strength/Tone: WFL for tasks assessed LUE Sensation: WFL - Light Touch Right Lower Extremity Assessment RLE ROM/Strength/Tone: WFL for tasks assessed RLE Sensation: WFL - Light Touch Left Lower Extremity Assessment LLE ROM/Strength/Tone: Deficits;Unable to fully assess;Due to pain LLE ROM/Strength/Tone Deficits: Strength 3/5   Balance    End of Session PT - End of Session Equipment Utilized During Treatment: Gait belt;Left knee immobilizer Activity Tolerance: Patient limited by fatigue (Reports feeling "funny" from pain meds.) Patient left: in chair;with call bell/phone within reach;with family/visitor present Nurse Communication: Mobility status CPM Left Knee CPM Left Knee: Off  GP     Vena Austria 07/25/2012, 5:09 PM Durenda Hurt. Renaldo Fiddler, [facility] Acute Rehab Services Pager [phone]

## 2012-07-25 NOTE — Progress Notes (Signed)
ANTICOAGULATION CONSULT NOTE - Initial Consult  Pharmacy Consult for warfarin Indication: VTE prophylaxis/hx of PE  Allergies  Allergen Reactions  . Morphine And Related Nausea Only    Nausea   . Penicillins Other (See Comments)    seizures    Patient Measurements: Height: 5' 6.93" (170 cm) Weight: 220 lb 14.4 oz (100.2 kg) IBW/kg (Calculated) : 61.44   Vital Signs: Temp: 97.9 F (36.6 C) (01/06 1430) Temp src: Oral (01/06 0703) BP: 124/76 mmHg (01/06 1430) Pulse Rate: 96  (01/06 1430)  Labs:  Basename 07/25/12 1501 07/25/12 0641  HGB 12.2 --  HCT 37.5 --  PLT 147* --  APTT -- 31  LABPROT -- 13.2  INR -- 1.01  HEPARINUNFRC -- --  CREATININE 0.78 --  CKTOTAL -- --  CKMB -- --  TROPONINI -- --    Estimated Creatinine Clearance: 80.6 ml/min (by C-G formula based on Cr of 0.78).   Medical History: Past Medical History  Diagnosis Date  . GERD (gastroesophageal reflux disease)   . Arthritis   . Asthma   . Blood transfusion   . Clotting disorder   . Sleep apnea   . Heart murmur   . Hypertension   . Hx of blood clots 8/12    lungs  . H/O hiatal hernia   . Seizures     from pcn  . Cancer     breast lft  . Fibromyalgia   . Anxiety   . Right knee DJD   . Left knee DJD   . Subcutaneous hematoma 07/25/2012    Intraoperative cultures are pending on this fluid collection that was just outside the left knee joint.  Gram stain had few WBC monos and polys.    Assessment: 18 YOF on coumadin PTA for hx of PE, s/p left TKA today, coumadin has been on hold since 12/31 and bridge on lovenox prior to surger. INR 1.01 today, and plan to resume coumadin per Rx. hgb 12.2, plt 147. She is also on lovenox 30mg  q12hrs for VTE prophylaxis.   PTA dose: 2.5mg /5mg  alternates every other day.  Goal of Therapy:  INR 2-3 Monitor platelets by anticoagulation protocol: Yes   Plan:  - Coumadin 5mg  po x 1 - Daily PT/INR - d/c lovenox when INR > 2   Bayard Hugger, PharmD, BCPS    Clinical Pharmacist  Pager: [phone]  07/25/2012,3:51 PM  Admit Complaint: s/p L TKA

## 2012-07-25 NOTE — Progress Notes (Signed)
Orthopedic Tech Progress Note Patient Details:  Lydia Campbell [birth year] [id number]  CPM Left Knee CPM Left Knee: On Left Knee Flexion (Degrees): 60  Left Knee Extension (Degrees): 0  Additional Comments: TRAPEZE BAR   Shawnie Pons 07/25/2012, 1:00 PM

## 2012-07-25 NOTE — H&P (View-Only) (Signed)
TOTAL KNEE ADMISSION H&P  Patient is being admitted for left total knee arthroplasty.  Subjective:  Chief Complaint:left knee pain.  HPI: Lydia Campbell, 71 y.o. female, has a history of pain and functional disability in the left knee due to arthritis and has failed non-surgical conservative treatments for greater than 12 weeks to includeNSAID's and/or analgesics, corticosteriod injections, viscosupplementation injections, flexibility and strengthening excercises, supervised PT with diminished ADL's post treatment, use of assistive devices, weight reduction as appropriate and activity modification.  Onset of symptoms was gradual, starting 8 years ago with gradually worsening course since that time. The patient noted prior procedures on the knee to include  arthroscopy and menisectomy on the left knee(s).  Patient currently rates pain in the left knee(s) at 8 out of 10 with activity. Patient has night pain, worsening of pain with activity and weight bearing, pain that interferes with activities of daily living, crepitus and joint swelling.  Patient has evidence of subchondral sclerosis, periarticular osteophytes and joint space narrowing by imaging studies.  There is no active infection.  Patient Active Problem List   Diagnosis Date Noted  . Blood loss anemia 12/22/2011  . Hypokalemia 12/22/2011  . GERD (gastroesophageal reflux disease)   . Arthritis   . Sleep apnea   . Hypertension   . Hx of blood clots   . Fibromyalgia   . Anxiety   . Right knee DJD   . Left knee DJD   . Breast cancer 06/09/2011  . MITRAL REGURGITATION 05/13/2009   Past Medical History  Diagnosis Date  . GERD (gastroesophageal reflux disease)   . Arthritis   . Asthma   . Blood transfusion   . Clotting disorder   . Sleep apnea   . Heart murmur   . Hypertension   . Hx of blood clots 8/12    lungs  . H/O hiatal hernia   . Seizures     from pcn  . Cancer     breast lft  . Fibromyalgia   . Anxiety   .  Right knee DJD   . Left knee DJD     Past Surgical History  Procedure Date  . Abdominal hysterectomy   . Tonsillectomy   . Breast surgery   . Knee cartilage surgery 2001 and 2009    bil  . Breast surgery 03-28-08    lft  . Mastectomy     lft  . Total knee arthroplasty 12/21/2011    Procedure: TOTAL KNEE ARTHROPLASTY;  Surgeon: Robert A Wainer, MD;  Location: [facility] OR;  Service: Orthopedics;  Laterality: Right;  DR WAINER WANTS 90 MINUTES FOR THIS CASE     (Not in a hospital admission) Allergies  Allergen Reactions  . Morphine And Related Nausea Only    Nausea   . Penicillins Other (See Comments)    seizures    Current Outpatient Prescriptions on File Prior to Visit  Medication Sig Dispense Refill  . allopurinol (ZYLOPRIM) 300 MG tablet Take 300 mg by mouth daily.       . Ascorbic Acid (VITAMIN C) 1000 MG tablet Take 1,000 mg by mouth daily.       . B Complex-Biotin-FA (B-COMPLEX PO) Take 100 mg by mouth at bedtime.       . Biotin 5000 MCG CAPS Take 1 capsule by mouth daily.       . budesonide (PULMICORT) 0.25 MG/2ML nebulizer solution Take 0.25 mg by nebulization daily.      . calcium carbonate (CALCIUM 500)   1250 MG tablet Take 1 tablet by mouth daily.      . cetirizine (ZYRTEC) 10 MG tablet Take 10 mg by mouth daily.       . cholecalciferol (VITAMIN D) 400 UNITS TABS Take 400 Units by mouth daily.       . hydroxypropyl methylcellulose (ISOPTO TEARS) 2.5 % ophthalmic solution Place 1 drop into both eyes 4 (four) times daily as needed. Dry eyes      . MAGNESIUM PO Take 50 mg by mouth 3 (three) times daily.       . nabumetone (RELAFEN) 500 MG tablet Take 500 mg by mouth daily.       . NON FORMULARY Patient uses a C PAP machine      . omeprazole (PRILOSEC) 20 MG capsule Take 20 mg by mouth daily.       . pantoprazole (PROTONIX) 40 MG tablet Take 40 mg by mouth daily.       . Potassium Chloride Crys CR (KLOR-CON M20 PO) Take 1 tablet by mouth 2 (two) times daily.       . traZODone  (DESYREL) 50 MG tablet Take 50 mg by mouth at bedtime.       . triamterene-hydrochlorothiazide (MAXZIDE) 75-50 MG per tablet Take 1 tablet by mouth daily.       . metoCLOPramide (REGLAN) 10 MG tablet Take 1 tablet (10 mg total) by mouth every 8 (eight) hours as needed (nausea).  20 tablet  0  . pimecrolimus (ELIDEL) 1 % cream Apply 1 application topically daily.      . warfarin (COUMADIN) 5 MG tablet Take 2.5-5 mg by mouth daily. Alternates every other day. Warfarin 5mg due Monday 07/11/12 then 2.5mg due Tuesday 07/12/12         History  Substance Use Topics  . Smoking status: Former Smoker -- 5 years    Types: Cigarettes    Quit date: 12/06/1968  . Smokeless tobacco: Never Used  . Alcohol Use: No    Family History  Problem Relation Age of Onset  . Heart disease Mother   . Diabetes Father   . Heart disease Father   . Heart disease Brother      Review of Systems  Constitutional: Negative.   HENT: Negative.   Eyes: Negative.   Respiratory: Negative.   Cardiovascular: Negative.   Gastrointestinal: Negative.   Musculoskeletal: Positive for joint pain.  Skin: Negative.   Neurological: Negative.   Endo/Heme/Allergies: Negative.   Psychiatric/Behavioral: Negative.     Objective:  Physical Exam  Constitutional: She is oriented to person, place, and time. She appears well-developed and well-nourished.  HENT:  Head: Normocephalic and atraumatic.  Eyes: Conjunctivae normal and EOM are normal. Pupils are equal, round, and reactive to light.  Neck: Normal range of motion.  Cardiovascular: Normal rate and regular rhythm.   Respiratory: Effort normal.  GI: Soft.  Genitourinary:       Not pertinent to current symptomatology therefore not examined.  Musculoskeletal:       Examination of her left knee reveals mild valgus deformity 1 to 2+ crepitation 1+ synovitis range of motion 0-120 degrees with diffuse pain and normal patella tracking. Exam of the right knee reveals well healed  total knee incision without swelling or pain, full range of motion knee is stable with normal patella tracking. Vascular exam: pulses 2+ and symmetric.  Neurological: She is alert and oriented to person, place, and time.  Skin: Skin is warm and dry.  Psychiatric: She has   a normal mood and affect. Her behavior is normal. Judgment and thought content normal.    Vital signs in last 24 hours: Last recorded: 12/26 1100   BP: 126/78 Pulse: 75  Temp: 98 F (36.7 C) Resp: 12  Height: 5' 7" (1.702 m) SpO2: 97  Weight: 99.791 kg (220 lb)     Labs:   Estimated Body mass index is 34.46 kg/(m^2) as calculated from the following:   Height as of this encounter: 5' 7"(1.702 m).   Weight as of this encounter: 220 lb(99.791 kg).   Imaging Review Plain radiographs demonstrate severe degenerative joint disease of the left knee(s). The overall alignment issignificant valgus. The bone quality appears to be good for age and reported activity level.  Assessment/Plan:  End stage arthritis, left knee  Patient Active Problem List  Diagnosis  . MITRAL REGURGITATION  . Breast cancer  . GERD (gastroesophageal reflux disease)  . Arthritis  . Sleep apnea  . Hypertension  . Hx of blood clots  . Fibromyalgia  . Anxiety  . Right knee DJD  . Left knee DJD  . Blood loss anemia  . Hypokalemia   The patient history, physical examination, clinical judgment of the provider and imaging studies are consistent with end stage degenerative joint disease of the left knee(s) and total knee arthroplasty is deemed medically necessary. The treatment options including medical management, injection therapy arthroscopy and arthroplasty were discussed at length. The risks and benefits of total knee arthroplasty were presented and reviewed. The risks due to aseptic loosening, infection, stiffness, patella tracking problems, thromboembolic complications and other imponderables were discussed. The patient acknowledged the  explanation, agreed to proceed with the plan and consent was signed. Patient is being admitted for inpatient treatment for surgery, pain control, PT, OT, prophylactic antibiotics, VTE prophylaxis, progressive ambulation and ADL's and discharge planning. The patient is planning to be discharged home with home health services  Carrah Eppolito A. Antwyne Pingree, PA-C Physician Assistant [facility] [phone]  07/14/2012, 1:10 PM 

## 2012-07-25 NOTE — Transfer of Care (Signed)
Immediate Anesthesia Transfer of Care Note  Patient: Lydia Campbell  Procedure(s) Performed: Procedure(s) (LRB) with comments: TOTAL KNEE ARTHROPLASTY (Left)  Patient Location: PACU  Anesthesia Type:General  Level of Consciousness: awake, alert , oriented and patient cooperative  Airway & Oxygen Therapy: Patient Spontanous Breathing and Patient connected to nasal cannula oxygen  Post-op Assessment: Report given to PACU RN, Post -op Vital signs reviewed and stable and Patient moving all extremities  Post vital signs: Reviewed and stable  Complications: No apparent anesthesia complications

## 2012-07-25 NOTE — Preoperative (Signed)
Beta Blockers   Reason not to administer Beta Blockers:Not Applicable 

## 2012-07-25 NOTE — Brief Op Note (Signed)
07/25/2012  11:03 AM  PATIENT:  Lydia Campbell  72 y.o. female  PRE-OPERATIVE DIAGNOSIS:  DJD LEFT KNEE  POST-OPERATIVE DIAGNOSIS:  DJD LEFT KNEE  PROCEDURE:  Procedure(s) (LRB) with comments: TOTAL KNEE ARTHROPLASTY (Left)  SURGEON:  Surgeon(s) and Role:    * Nilda Simmer, MD - Primary  PHYSICIAN ASSISTANT: Kirstin Shepperson PA-C  ASSISTANTS: Kirstin Shepperson PA-C   ANESTHESIA:   general  EBL:  Total I/O In: 1000 [I.V.:1000] Out: 400 [Urine:400]  BLOOD ADMINISTERED:none  DRAINS: (2) Hemovact drain(s) in the left knee with  Suction Clamped and Urinary Catheter (Suprapubic)   LOCAL MEDICATIONS USED:None}  SPECIMEN:  No Specimen  DISPOSITION OF SPECIMEN:  N/A  COUNTS:  YES  TOURNIQUET:  * Missing tourniquet times found for documented tourniquets in log:  72217 *  DICTATION: .Note written in EPIC  PLAN OF CARE: Admit to inpatient   PATIENT DISPOSITION:  PACU - hemodynamically stable.   Delay start of Pharmacological VTE agent (>24hrs) due to surgical blood loss or risk of bleeding: no

## 2012-07-25 NOTE — Interval H&P Note (Signed)
History and Physical Interval Note:  07/25/2012 8:55 AM  Lydia Campbell  has presented today for surgery, with the diagnosis of DJD LEFT KNEE  The various methods of treatment have been discussed with the patient and family. After consideration of risks, benefits and other options for treatment, the patient has consented to  Procedure(s) (LRB) with comments: TOTAL KNEE ARTHROPLASTY (Left) as a surgical intervention .  The patient's history has been reviewed, patient examined, no change in status, stable for surgery.  I have reviewed the patient's chart and labs.  Questions were answered to the patient's satisfaction.     Salvatore Marvel A

## 2012-07-25 NOTE — Anesthesia Procedure Notes (Addendum)
Anesthesia Regional Block:  Femoral nerve block  Pre-Anesthetic Checklist: ,, timeout performed, Correct Patient, Correct Site, Correct Laterality, Correct Procedure, Correct Position, site marked, Risks and benefits discussed,  Surgical consent,  Pre-op evaluation,  At surgeon's request and post-op pain management  Laterality: Left  Prep: chloraprep       Needles:  Injection technique: Single-shot  Needle Type: Echogenic Stimulator Needle     Needle Length: 5cm 5 cm Needle Gauge: 22 and 22 G    Additional Needles:  Procedures: nerve stimulator Femoral nerve block  Nerve Stimulator or Paresthesia:  Response: 0.48 mA,   Additional Responses:   Narrative:  Start time: 07/25/2012 8:28 AM End time: 07/25/2012 8:38 AM Injection made incrementally with aspirations every 5 mL. Anesthesiologist: Dr Gypsy Balsam  Additional Notes: 9604-5409 L FNB POP CHG prep, sterile tech #[id number] stim needle w/stim down to .48ma Multiple neg asp Marc .5% w/epi 1/200000 total 25cc+decadron 4mg  infil No compl Dr Gypsy Balsam   Procedure Name: Intubation Date/Time: 07/25/2012 9:11 AM Performed by: Jerilee Hoh Pre-anesthesia Checklist: Patient identified, Emergency Drugs available, Suction available and Patient being monitored Patient Re-evaluated:Patient Re-evaluated prior to inductionOxygen Delivery Method: Circle system utilized Preoxygenation: Pre-oxygenation with 100% oxygen Intubation Type: IV induction Ventilation: Mask ventilation without difficulty Laryngoscope Size: Mac and 3 Grade View: Grade II Tube type: Oral Tube size: 7.5 mm Number of attempts: 2 Airway Equipment and Method: Stylet Placement Confirmation: ETT inserted through vocal cords under direct vision,  positive ETCO2 and breath sounds checked- equal and bilateral Secured at: 21 cm Tube secured with: Tape Dental Injury: Injury to lip  Comments: Minor upper lip laceration.  Ointment applied.

## 2012-07-25 NOTE — Anesthesia Postprocedure Evaluation (Signed)
  Anesthesia Post-op Note  Patient: Lydia Campbell  Procedure(s) Performed: Procedure(s) (LRB) with comments: TOTAL KNEE ARTHROPLASTY (Left)  Patient Location: PACU  Anesthesia Type:GA combined with regional for post-op pain  Level of Consciousness: awake  Airway and Oxygen Therapy: Patient Spontanous Breathing  Post-op Pain: mild  Post-op Assessment: Post-op Vital signs reviewed, Patient's Cardiovascular Status Stable, Respiratory Function Stable, Patent Airway, No signs of Nausea or vomiting and Pain level controlled  Post-op Vital Signs: stable  Complications: No apparent anesthesia complications

## 2012-07-26 ENCOUNTER — Encounter (HOSPITAL_COMMUNITY): Payer: Self-pay | Admitting: Orthopedic Surgery

## 2012-07-26 DIAGNOSIS — B964 Proteus (mirabilis) (morganii) as the cause of diseases classified elsewhere: Secondary | ICD-10-CM | POA: Diagnosis present

## 2012-07-26 LAB — BASIC METABOLIC PANEL
BUN: 19 mg/dL (ref 6–23)
Chloride: 101 mEq/L (ref 96–112)
GFR calc Af Amer: >90 mL/min (ref >90)
Glucose, Bld: 155 mg/dL — ABNORMAL HIGH (ref 70–99)
Potassium: 3.8 mEq/L (ref 3.5–5.1)
Sodium: 136 mEq/L (ref 135–145)

## 2012-07-26 LAB — CBC
HCT: 32.9 % — ABNORMAL LOW (ref 36.0–46.0)
Hemoglobin: 11.1 g/dL — ABNORMAL LOW (ref 12.0–15.0)
WBC: 11.2 10*3/uL — ABNORMAL HIGH (ref 4.0–10.5)

## 2012-07-26 MED ORDER — ENOXAPARIN SODIUM 30 MG/0.3ML ~~LOC~~ SOLN: SUBCUTANEOUS | Status: DC

## 2012-07-26 MED ORDER — ACETAMINOPHEN 325 MG PO TABS: 650.0000 mg | ORAL_TABLET | Freq: Four times a day (QID) | ORAL | Status: DC | PRN

## 2012-07-26 MED ORDER — DSS 100 MG PO CAPS: ORAL_CAPSULE | ORAL | Status: DC

## 2012-07-26 MED ORDER — WARFARIN SODIUM 5 MG PO TABS
5.0000 mg | ORAL_TABLET | Freq: Once | ORAL | Status: DC
  Filled 2012-07-26: qty 1

## 2012-07-26 MED ORDER — CELECOXIB 200 MG PO CAPS: 200.0000 mg | ORAL_CAPSULE | Freq: Every day | ORAL | Status: DC

## 2012-07-26 MED ORDER — OXYCODONE HCL 5 MG PO TABS: 5.0000 mg | ORAL_TABLET | ORAL | Status: DC | PRN

## 2012-07-26 NOTE — Evaluation (Signed)
Occupational Therapy Evaluation Patient Details Name: Lydia Campbell MRN: [id number] DOB: 1941-02-09 Today's Date: 07/26/2012 Time: 8469-6295 OT Time Calculation (min): 26 min  OT Assessment / Plan / Recommendation Clinical Impression  Pt demos decline in function with ADLs and ADL mobility following L TKR. Pt would benefit from skilled OT services to address these impairments to increase safety and Independence to return home    OT Assessment  Patient needs continued OT Services    Follow Up Recommendations  No OT follow up    Barriers to Discharge None Pt's spouse will be at home to provide assist and she has ADL A/E  Equipment Recommendations       Recommendations for Other Services    Frequency  Min 2X/week    Precautions / Restrictions Precautions Precautions: Knee Required Braces or Orthoses: Knee Immobilizer - Left Knee Immobilizer - Left: On except when in CPM Restrictions Weight Bearing Restrictions: Yes LLE Weight Bearing: Weight bearing as tolerated       ADL  Eating/Feeding: Performed;Independent Where Assessed - Eating/Feeding: Chair Grooming: Performed;Wash/dry hands;Min guard Where Assessed - Grooming: Supported standing Upper Body Bathing: Simulated;Supervision/safety;Set up Where Assessed - Upper Body Bathing: Unsupported sitting Lower Body Bathing: Moderate assistance Where Assessed - Lower Body Bathing: Unsupported sitting Upper Body Dressing: Performed;Supervision/safety;Set up Where Assessed - Upper Body Dressing: Unsupported sitting Lower Body Dressing: Performed;Moderate assistance Where Assessed - Lower Body Dressing: Unsupported sitting;Supported sit to stand Toilet Transfer: Performed;Supervision/safety;Min guard Acupuncturist: Grab bars;Raised toilet seat with arms (or 3-in-1 over toilet) Toileting - Clothing Manipulation and Hygiene: Performed;Min guard Where Assessed - Engineer, mining and Hygiene:  Standing Tub/Shower Transfer: Performed;Supervision/safety;Min guard Equipment Used: Gait belt;Knee Immobilizer;Long-handled sponge;Reacher;Rolling walker;Sock aid ADL Comments: Pt has ADL adaptive equoiment at home already and is familiar with use    OT Diagnosis: Generalized weakness  OT Problem List: Decreased strength;Decreased activity tolerance;Impaired balance (sitting and/or standing);Pain OT Treatment Interventions: Self-care/ADL training;Therapeutic activities;Therapeutic exercise;Neuromuscular education;Balance training;Patient/family education   OT Goals Acute Rehab OT Goals OT Goal Formulation: With patient Time For Goal Achievement: 08/02/12 Potential to Achieve Goals: Good ADL Goals Pt Will Perform Lower Body Bathing: with min assist ADL Goal: Lower Body Bathing - Progress: Goal set today Pt Will Perform Lower Body Dressing: with min assist ADL Goal: Lower Body Dressing - Progress: Goal set today Pt Will Transfer to Toilet: with modified independence ADL Goal: Toilet Transfer - Progress: Goal set today Pt Will Perform Toileting - Hygiene: with modified independence ADL Goal: Toileting - Hygiene - Progress: Goal set today  Visit Information  Last OT Received On: 07/26/12 Assistance Needed: +1    Subjective Data  Subjective: " I am going home today and i will habe plenty of help " Patient Stated Goal: To return home and have HH therapy   Prior Functioning     Dominant Hand: Right         Vision/Perception Vision - Assessment Eye Alignment: Within Functional Limits Perception Perception: Within Functional Limits   Cognition  Overall Cognitive Status: Appears within functional limits for tasks assessed/performed Arousal/Alertness: Awake/alert Orientation Level: Appears intact for tasks assessed Behavior During Session: [facility] for tasks performed    Extremity/Trunk Assessment Right Upper Extremity Assessment RUE ROM/Strength/Tone: [facility] for tasks  assessed Left Upper Extremity Assessment LUE ROM/Strength/Tone: WFL for tasks assessed     Mobility Transfers Transfers: Sit to Stand;Stand to Sit Sit to Stand: 5: Supervision Stand to Sit: 5: Supervision Details for Transfer Assistance: good hand placement and safety  awareness                  End of Session OT - End of Session Equipment Utilized During Treatment: Gait belt;Left knee immobilizer Activity Tolerance: Patient tolerated treatment well Patient left: in chair;with call bell/phone within reach  GO     Galen Manila 07/26/2012, 11:52 AM

## 2012-07-26 NOTE — Progress Notes (Signed)
Physical Therapy Treatment Patient Details Name: Lydia Campbell MRN: [id number] DOB: 03-Feb-1941 Today's Date: 07/26/2012 Time: 4098-1191 PT Time Calculation (min): 25 min  PT Assessment / Plan / Recommendation Comments on Treatment Session  Patient progressing well. Planning on discharge today.     Follow Up Recommendations  Home health PT;Supervision/Assistance - 24 hour     Does the patient have the potential to tolerate intense rehabilitation     Barriers to Discharge        Equipment Recommendations  None recommended by PT    Recommendations for Other Services    Frequency 7X/week   Plan Discharge plan remains appropriate;Frequency remains appropriate    Precautions / Restrictions Precautions Required Braces or Orthoses: Knee Immobilizer - Left Restrictions LLE Weight Bearing: Weight bearing as tolerated   Pertinent Vitals/Pain     Mobility  Transfers Sit to Stand: 5: Supervision;From toilet;With upper extremity assist Stand to Sit: 5: Supervision;To chair/3-in-1;With upper extremity assist Details for Transfer Assistance: good hand placement and safety awareness Ambulation/Gait Ambulation/Gait Assistance: 5: Supervision Ambulation Distance (Feet): 350 Feet Assistive device: Rolling walker Ambulation/Gait Assistance Details: Patient ambulating with good technique. Cues for posture Gait Pattern: Step-through pattern;Decreased stride length Stairs: Yes Stairs Assistance: 4: Min guard Stairs Assistance Details (indicate cue type and reason): Cues for sequence.  Stair Management Technique: Step to pattern;Forwards;With walker Number of Stairs: 1     Exercises Total Joint Exercises Quad Sets: AROM;Left;10 reps Short Arc Quad: AROM;Left;10 reps Heel Slides: AAROM;Left;10 reps Hip ABduction/ADduction: AROM;Left;10 reps Straight Leg Raises: AAROM;Left;10 reps   PT Diagnosis:    PT Problem List:   PT Treatment Interventions:     PT Goals Acute Rehab PT  Goals PT Goal: Sit to Stand - Progress: Met PT Goal: Ambulate - Progress: Met PT Goal: Up/Down Stairs - Progress: Met PT Goal: Perform Home Exercise Program - Progress: Progressing toward goal  Visit Information  Last PT Received On: 07/26/12 Assistance Needed: +1    Subjective Data      Cognition  Overall Cognitive Status: Appears within functional limits for tasks assessed/performed Arousal/Alertness: Awake/alert Orientation Level: Appears intact for tasks assessed Behavior During Session: [facility] for tasks performed    Balance     End of Session PT - End of Session Equipment Utilized During Treatment: Gait belt;Left knee immobilizer Activity Tolerance: Patient tolerated treatment well Patient left: in chair;with call bell/phone within reach Nurse Communication: Mobility status   GP     Fredrich Birks 07/26/2012, 9:51 AM  07/26/2012 Fredrich Birks PTA [phone] pager [phone] office

## 2012-07-26 NOTE — Plan of Care (Signed)
Problem: Phase II Progression Outcomes Goal: Discharge plan established Agree with d/c home with home health PT after acute d/c, no follow up OT needed at this time

## 2012-07-26 NOTE — Op Note (Signed)
NAMEKAYLAMARIE, SWICKARD           ACCOUNT NO.:  [id number]  MEDICAL RECORD NO.:  [id number]  LOCATION:  [id number]                        FACILITY:  [facility]  PHYSICIAN:  Odin Mariani A. Thurston Hole, M.D. DATE OF BIRTH:  11-28-1940  DATE OF PROCEDURE:  07/25/2012 DATE OF DISCHARGE:                              OPERATIVE REPORT   PREOPERATIVE DIAGNOSES: 1. Left knee degenerative joint disease. 2. Left knee tibial plateau fracture.  POSTOPERATIVE DIAGNOSES: 1. Left knee degenerative joint disease. 2. Left knee tibial plateau fracture.  PROCEDURE: 1. Left total knee replacement using DePuy cemented total knee system     with #4 narrow cemented femur with #4 MBT revision keel tray with     90 mm stem - cemented. 2. A 10-degree polyethylene RP tibial spacer. 3. A 35-mm cemented patella.  SURGEON:  Elana Alm. Thurston Hole, M.D.  ASSISTANT:  Julien Girt, PA-C  ANESTHESIA:  General.  OPERATIVE TIME:  1 hour and 50 minutes.  COMPLICATIONS:  None.  DESCRIPTION OF PROCEDURE:  Ms. Spong was brought to the operating room on July 25, 2012, after a femoral nerve block was placed in the holding room by Anesthesia.  She was placed on operative table in a supine position.  She received vancomycin 1.5 g IV preoperatively for prophylaxis.  She had a Foley catheter placed under sterile conditions. After being placed under general anesthesia, her left knee was examined. She had range of motion from 0-125 degrees, moderate valgus deformity. Knee stable ligamentous exam with normal patellar tracking.  Left leg was prepped using sterile DuraPrep and draped using sterile technique. Time-out procedure was called and the correct left knee identified. Initially, leg was exsanguinated and a thigh tourniquet elevated to 365 mm.  Initially, through a 15 cm longitudinal incision based over the patella, initial exposure was made.  The underlying subcutaneous tissues were incised along with skin incision.  There was  a large partially liquified hematoma in her anteromedial deep tissue space above the bone and above the patellar tendon from a fall that she sustained on April 04, 2012.  This was thoroughly evacuated and portions of this hematoma were sent for stat Gram stain and culture.  Stat Gram stain subsequently came back negative.  Thorough irrigation and debridement of this liquified hematoma was carried out.  Median arthrotomy was then carried out revealing normal amount of joint fluid.  The articular surfaces were inspected.  She had grade 4 changes laterally, grade 3 and 4 changes medially and in the patellofemoral joint.  Osteophytes removed from the femoral condyles and tibial plateau.  The medial and lateral meniscal remnants were removed as well as the anterior cruciate ligament.  The proximal medial tibia was exposed and there was found to be a small crack in her fracture line in this region potentially from her fall on April 04, 2012.  This would be carefully fixated at the time of her tibial implantation with an extra long tibial stem in order to secure this area along with bone cement.  It was not unstable.  An intramedullary drill was then drilled up the femoral canal for placement of the distal femoral cutting jig which was placed in the appropriate manner rotation and a distal 10  mm cut was made.  The distal femur was incised.  #4 was found to be appropriate size.  #4 cutting jig was placed, and the anterior and posterior chamfer cuts were then made in the appropriate manner of external rotation.  The proximal tibia was then exposed.  The proximal tibial cut was then made with an extramedullary guide removing 6 mm off the medial and 4 mm off the lateral side correcting her valgus deformity.  After this was done, then the #4 tibial base plate trial was placed on the cut tibial surface and the intramedullary drill was used along with a keel and hand reamers used to ream down  to the 90 mm depth for placement of the extra long tibial stem.  #4 tibial tray was found to be the appropriate size.  With the tibial trial in place and a #4 and narrow femoral trial, a 10 mm polyethylene RP tibial spacer was placed on tibial baseplate.  The knee reduced, taken through range of motion from 0-120 degrees with excellent stability and excellent correction of her flexion and valgus deformities and normal patellar tracking.  A resurfacing 9 mm cut was then made on the patella and 3 locking holes placed for a 35 mm polyethylene patellar trial and again, patellofemoral tracking was evaluated and found to be normal.  At this point, felt that all the trial components were of excellent size, fit, and stability.  They were then removed.  The knee was then jet lavage irrigated with 3 L of saline.  The proximal tibia was then exposed and the tibial reamed stem and area along with the entire proximal tibia was coated with tobramycin impregnated cement and then the #4 MBT revision tibial tray with the 90 mm stem was placed down the tibia with an excellent fit, with excess cement being removed from around the edges.  This completely secured the proximal tibial plateau fracture with the cement hardened, with firm and tight anatomic fixation.  The femoral component with cement backing was hammered into position also with an excellent fit with excess cement being removed around the edges.  A 10 mm polyethylene RP tibial spacer was placed on tibial baseplate.  The knee reduced, taken through range of motion from 0-120 degrees with excellent stability and excellent correction of her flexion and varus deformities.  The 35 mm polyethylene cement backed patella was then placed in its position and held there with a clamp. After the cement hardened, again patellofemoral tracking was evaluated and found to be normal.  At this point, felt that all the components were of excellent size, fit, and  stability.  The wound was further irrigated with saline and then the arthrotomy was closed with #1 Ethibond suture and 0-Vicryl suture over 2 medium Hemovac drains. Subcutaneous tissues closed 0 and 2-0 Vicryl, subcuticular layer closed with 4-0 Monocryl.  Sterile dressings were applied in a long-leg splint and the patient awakened, extubated, and taken to the recovery room in stable condition.  Needle and sponge count was correct x2 at the end of the case.     Shubham Thackston A. Thurston Hole, M.D.     RAW/MEDQ  D:  07/25/2012  T:  07/26/2012  Job:  [id number]

## 2012-07-26 NOTE — Progress Notes (Signed)
D/C instructions and scripts given> pt family at bedside to take home.

## 2012-07-26 NOTE — Discharge Summary (Signed)
Patient ID: Lydia Campbell MRN: [id number] DOB/AGE: 70-01-1941 70 y.o.  Admit date: 07/25/2012 Discharge date: 07/26/2012  Admission Diagnoses:  Principal Problem:  *Left knee DJD Active Problems:  MITRAL REGURGITATION  GERD (gastroesophageal reflux disease)  Arthritis  Sleep apnea  Hypertension  Hx of blood clots  Fibromyalgia  Anxiety  Right knee DJD  Hypokalemia  Subcutaneous hematoma  Urinary tract infection due to Proteus currently on day 3 of Cipro   Discharge Diagnoses:  Same  Past Medical History  Diagnosis Date  . GERD (gastroesophageal reflux disease)   . Asthma   . Blood transfusion 07/2008    "related to chemo" (07/25/2012)  . Clotting disorder     "sudden chest pain; found multiple blood clots in both lungs; they think it was from Tamoxifen" (07/25/2012)  . Heart murmur   . Hypertension   . H/O hiatal hernia   . Fibromyalgia   . Anxiety   . Subcutaneous hematoma 07/25/2012    Intraoperative cultures are pending on this fluid collection that was just outside the left knee joint.  Gram stain had few WBC monos and polys.  . Pulmonary embolism 02/2011  . Exertional dyspnea   . OSA on CPAP   . Hx of blood clots 02/2011    "sudden chest pain; found multiple blood clots in both lungs; they think it was from Tamoxifen; haven't had any since 11/2011" (07/25/2012)  . Breast cancer     breast lft  . Seizures     "seizures vs convulsions related to PCN when I was a child" (07/25/2012)  . Osteoarthritis     "pretty much everywhere" (07/25/2012)  . Right knee DJD   . Left knee DJD   . Chronic lower back pain   . Gout     Surgeries: Procedure(s): TOTAL KNEE ARTHROPLASTY on 07/25/2012   Consultants:    Discharged Condition: Improved  Hospital Course: Lydia Campbell is an 72 y.o. female who was admitted 07/25/2012 for operative treatment ofLeft knee DJD. Patient has severe unremitting pain that affects sleep, daily activities, and work/hobbies. After pre-op clearance  the patient was taken to the operating room on 07/25/2012 and underwent  Procedure(s): TOTAL KNEE ARTHROPLASTY.  Incision and Drainage of subcutaneous hematoma with cultures.  Patient was given perioperative antibiotics:     Anti-infectives     Start     Dose/Rate Route Frequency Ordered Stop   07/25/12 2200   ciprofloxacin (CIPRO) IVPB 400 mg        400 mg 200 mL/hr over 60 Minutes Intravenous Every 12 hours 07/25/12 1439     07/25/12 2100   vancomycin (VANCOCIN) IVPB 1000 mg/200 mL premix        1,000 mg 200 mL/hr over 60 Minutes Intravenous Every 12 hours 07/25/12 1439 07/28/12 2059   07/25/12 0934   tobramycin (NEBCIN) powder  Status:  Discontinued          As needed 07/25/12 0934 07/25/12 1136   07/24/12 1354   vancomycin (VANCOCIN) 1,500 mg in sodium chloride 0.9 % 500 mL IVPB        1,500 mg 250 mL/hr over 120 Minutes Intravenous 120 min pre-op 07/24/12 1354 07/25/12 0847           Patient was given sequential compression devices, early ambulation, and chemoprophylaxis to prevent DVT.  IV vancomycin and Cipro were given throughout her hospitalization due to her proteus UTI and the unexpected intraoperative finding of a subcutaneous hematoma.  Cultures are pending on the  fluid collection  Patient benefited maximally from hospital stay and there were no other complications.    Recent vital signs:  Patient Vitals for the past 24 hrs:  BP Temp Pulse Resp SpO2 Height Weight  07/26/12 0936 - - - - 96 % - -  07/26/12 0800 - - - 18  - - -  07/26/12 0713 130/74 mmHg 98.6 F (37 C) 92  20  100 % - -  07/26/12 0400 - - - 16  100 % - -  07/26/12 0000 - - - 16  100 % - -  07/25/12 2302 - - 94  20  100 % - -  07/25/12 2301 129/68 mmHg 98.1 F (36.7 C) 94  20  100 % - -  07/25/12 2000 - - - 16  100 % - -  07/25/12 1754 116/72 mmHg 97.9 F (36.6 C) 86  18  99 % - -  07/25/12 1600 - - - 18  - - -  07/25/12 1430 124/76 mmHg 97.9 F (36.6 C) 96  18  99 % - -  07/25/12 1415 - 97.6  F (36.4 C) - - 98 % 5' 6.93" (1.7 m) 100.2 kg (220 lb 14.4 oz)  07/25/12 1400 - - 96  - 100 % - -  07/25/12 1356 134/56 mmHg - 95  - 100 % - -  07/25/12 1345 - - 97  - 99 % - -  07/25/12 1341 131/63 mmHg - 96  - 99 % - -  07/25/12 1330 - - 93  - 99 % - -  07/25/12 1326 121/60 mmHg - 95  - 98 % - -  07/25/12 1315 - - 95  - 99 % - -  07/25/12 1311 124/55 mmHg - 99  - 96 % - -  07/25/12 1300 - 97.8 F (36.6 C) 94  - 96 % - -  07/25/12 1256 134/67 mmHg - 96  - 97 % - -  07/25/12 1246 - - 96  - 95 % - -  07/25/12 1245 97/58 mmHg - 98  - 96 % - -  07/25/12 1242 65/54 mmHg - 101  - 98 % - -  07/25/12 1230 - - 97  - 100 % - -  07/25/12 1227 156/60 mmHg - - - - - -  07/25/12 1226 - - 95  - 97 % - -  07/25/12 1215 - - 98  - 90 % - -  07/25/12 1212 151/67 mmHg - - - - - -  07/25/12 1200 - - 100  - 100 % - -  07/25/12 1156 140/66 mmHg - - - - - -  07/25/12 1145 124/50 mmHg 97.5 F (36.4 C) 105  - 97 % - -     Recent laboratory studies:   Basename 07/26/12 0605 07/25/12 1501 07/25/12 0641  WBC 11.2* 8.8 --  HGB 11.1* 12.2 --  HCT 32.9* 37.5 --  PLT 184 147* --  NA 136 -- --  K 3.8 -- --  CL 101 -- --  CO2 23 -- --  BUN 19 -- --  CREATININE 0.76 0.78 --  GLUCOSE 155* -- --  INR 1.06 -- 1.01  CALCIUM 8.3* -- --     Discharge Medications:     Medication List     As of 07/26/2012 11:35 AM    STOP taking these medications         enoxaparin 150 MG/ML injection   Commonly known as: LOVENOX  metoCLOPramide 10 MG tablet   Commonly known as: REGLAN      nabumetone 500 MG tablet   Commonly known as: RELAFEN      TAKE these medications         acetaminophen 325 MG tablet   Commonly known as: TYLENOL   Take 2 tablets (650 mg total) by mouth every 6 (six) hours as needed for fever (or Fever >/= 101).      allopurinol 300 MG tablet   Commonly known as: ZYLOPRIM   Take 300 mg by mouth daily.      B-COMPLEX PO   Take 100 mg by mouth at bedtime.      Biotin 5000 MCG  Caps   Take 1 capsule by mouth daily.      budesonide 0.25 MG/2ML nebulizer solution   Commonly known as: PULMICORT   Take 0.25 mg by nebulization daily.      CALCIUM 500 1250 MG tablet   Generic drug: calcium carbonate   Take 1 tablet by mouth daily.      celecoxib 200 MG capsule   Commonly known as: CELEBREX   Take 1 capsule (200 mg total) by mouth daily.      cetirizine 10 MG tablet   Commonly known as: ZYRTEC   Take 10 mg by mouth daily.      cholecalciferol 400 UNITS Tabs   Commonly known as: VITAMIN D   Take 400 Units by mouth daily.      ciprofloxacin 500 MG/5ML (10%) suspension   Commonly known as: CIPRO   Take 500 mg by mouth 2 (two) times daily.      DSS 100 MG Caps   1 tab 2 times a day while on narcotics.  STOOL SOFTENER      enoxaparin 30 MG/0.3ML injection   Commonly known as: LOVENOX   1 injection every 12 hrs until INR 2.0 or higher      hydroxypropyl methylcellulose 2.5 % ophthalmic solution   Commonly known as: ISOPTO TEARS   Place 1 drop into both eyes 4 (four) times daily as needed. Dry eyes      KLOR-CON M20 PO   Take 1 tablet by mouth 2 (two) times daily.      MAGNESIUM PO   Take 50 mg by mouth 3 (three) times daily.      NON FORMULARY   Patient uses a C PAP machine      omeprazole 20 MG capsule   Commonly known as: PRILOSEC   Take 20 mg by mouth daily.      oxyCODONE 5 MG immediate release tablet   Commonly known as: Oxy IR/ROXICODONE   Take 1-2 tablets (5-10 mg total) by mouth every 3 (three) hours as needed.      pantoprazole 40 MG tablet   Commonly known as: PROTONIX   Take 40 mg by mouth daily.      pimecrolimus 1 % cream   Commonly known as: ELIDEL   Apply 1 application topically daily.      traZODone 50 MG tablet   Commonly known as: DESYREL   Take 50 mg by mouth at bedtime.      triamterene-hydrochlorothiazide 75-50 MG per tablet   Commonly known as: MAXZIDE   Take 1 tablet by mouth daily.      vitamin C 1000 MG  tablet   Take 1,000 mg by mouth daily.      warfarin 5 MG tablet   Commonly known as: COUMADIN  Take 2.5-5 mg by mouth daily. Alternates every other day. Warfarin 5mg  due Monday 07/11/12 then 2.5mg  due Tuesday 07/12/12          Diagnostic Studies: No results found.  Disposition: 06-Home-Health Care Svc  Discharge Orders    Future Orders Please Complete By Expires   Diet - low sodium heart healthy      Call MD / Call 911      Comments:   If you experience chest pain or shortness of breath, CALL 911 and be transported to the hospital emergency room.  If you develope a fever above 101 F, pus (white drainage) or increased drainage or redness at the wound, or calf pain, call your surgeon's office.   Discharge instructions      Comments:   Total Knee Replacement Care After Refer to this sheet in the next few weeks. These discharge instructions provide you with general information on caring for yourself after you leave the hospital. Your caregiver may also give you specific instructions. Your treatment has been planned according to the most current medical practices available, but unavoidable complications sometimes occur. If you have any problems or questions after discharge, please call your caregiver. Regaining a near full range of motion of your knee within the first 3 to 6 weeks after surgery is critical. HOME CARE INSTRUCTIONS  You may resume a normal diet and activities as directed.  Perform exercises as directed.  Place yellow foam block, yellow side up under heel at all times except when in CPM or when walking.  DO NOT modify, tear, cut, or change in any way. You will receive physical therapy daily  Take showers instead of baths until informed otherwise.  Change bandages (dressings)daily Do not take over-the-counter or prescription medicines for pain, discomfort, or fever. Eat a well-balanced diet.  Avoid lifting or driving until you are instructed otherwise.  Make an  appointment to see your caregiver for stitches (suture) or staple removal as directed.  If you have been sent home with a continuous passive motion machine (CPM machine), 0-90 degrees 6 hrs a day   2 hrs a shift SEEK MEDICAL CARE IF: You have swelling of your calf or leg.  You develop shortness of breath or chest pain.  You have redness, swelling, or increasing pain in the wound.  There is pus or any unusual drainage coming from the surgical site.  You notice a bad smell coming from the surgical site or dressing.  The surgical site breaks open after sutures or staples have been removed.  There is persistent bleeding from the suture or staple line.  You are getting worse or are not improving.  You have any other questions or concerns.  SEEK IMMEDIATE MEDICAL CARE IF:  You have a fever.  You develop a rash.  You have difficulty breathing.  You develop any reaction or side effects to medicines given.  Your knee motion is decreasing rather than improving.  MAKE SURE YOU:  Understand these instructions.  Will watch your condition.  Will get help right away if you are not doing well or get worse.   Constipation Prevention      Comments:   Drink plenty of fluids.  Prune juice may be helpful.  You may use a stool softener, such as Colace (over the counter) 100 mg twice a day.  Use MiraLax (over the counter) for constipation as needed.   Increase activity slowly as tolerated      CPM  Comments:   Continuous passive motion machine (CPM):      Use the CPM from 0 to 90 for 6 hours per day.       You may break it up into 2 or 3 sessions per day.      Use CPM for 2 weeks or until you are told to stop.   TED hose      Comments:   Use stockings (TED hose) for 2 weeks on both leg(s).  You may remove them at night for sleeping.   Change dressing      Comments:   Change the dressing daily with sterile 4 x 4 inch gauze dressing and apply TED hose.  You may clean the incision with alcohol prior  to redressing.   Do not put a pillow under the knee. Place it under the heel.      Comments:   Place yellow foam block, yellow side up under heel at all times except when in CPM or when walking.  DO NOT modify, tear, cut, or change in any way the yellow foam block.      Follow-up Information    Follow up with Nilda Simmer, MD. On 08/08/2012. (appt time 3:15)    Contact information:   [address] [place] [address] [phone]           Signed: Pascal Lux 07/26/2012, 11:35 AM

## 2012-07-26 NOTE — Progress Notes (Signed)
ANTICOAGULATION CONSULT NOTE - Follow Up Consult  Pharmacy Consult for Coumadin Indication: hx pulmonary embolus and VTE prophylaxis s/p L TKA  Allergies  Allergen Reactions  . Morphine And Related Nausea Only       . Penicillins Other (See Comments)    "seizures vs convulsions; I threw up and went out; last time was when I was 72 yr old" (07/25/2012)    Patient Measurements: Height: 5' 6.93" (170 cm) Weight: 220 lb 14.4 oz (100.2 kg) IBW/kg (Calculated) : 61.44   Vital Signs: Temp: 98.6 F (37 C) (01/07 0713) BP: 130/74 mmHg (01/07 0713) Pulse Rate: 92  (01/07 0713)  Labs:  Basename 07/26/12 0605 07/25/12 1501 07/25/12 0641  HGB 11.1* 12.2 --  HCT 32.9* 37.5 --  PLT 184 147* --  APTT -- -- 31  LABPROT 13.7 -- 13.2  INR 1.06 -- 1.01  HEPARINUNFRC -- -- --  CREATININE 0.76 0.78 --  CKTOTAL -- -- --  CKMB -- -- --  TROPONINI -- -- --    Estimated Creatinine Clearance: 80.6 ml/min (by C-G formula based on Cr of 0.76).   Medications:  Scheduled:    . acetaminophen  1,000 mg Intravenous Q6H  . allopurinol  300 mg Oral Daily  . bisacodyl  10 mg Oral Daily  . budesonide  0.25 mg Nebulization Daily  . calcium carbonate  1 tablet Oral Daily  . celecoxib  200 mg Oral Q12H  . cholecalciferol  400 Units Oral Daily  . ciprofloxacin  400 mg Intravenous Q12H  . dexamethasone  10 mg Oral Daily   Or  . dexamethasone  10 mg Intravenous Daily  . docusate sodium  100 mg Oral BID  . enoxaparin (LOVENOX) injection  30 mg Subcutaneous Q12H  . [EXPIRED] fentaNYL      . loratadine  10 mg Oral Daily  . pantoprazole  40 mg Oral BID AC  . pimecrolimus  1 application Topical Daily  . potassium chloride  20 mEq Oral Daily  . traZODone  50 mg Oral QHS  . triamterene  50 mg Oral Daily  . vancomycin  1,000 mg Intravenous Q12H  . vitamin C  1,000 mg Oral Daily  . [COMPLETED] warfarin  5 mg Oral ONCE-1800  . Warfarin - Pharmacist Dosing Inpatient   Does not apply q1800  .  [DISCONTINUED] chlorhexidine  60 mL Topical Once  . [DISCONTINUED] chlorhexidine  60 mL Topical Once  . [DISCONTINUED] fentaNYL  50-100 mcg Intravenous Once  . [DISCONTINUED] Magnesium  50 mg Oral TID  . [DISCONTINUED] povidone-iodine   Topical Once  . [DISCONTINUED] vitamin C  1,000 mg Oral Daily    Assessment: 72 yo F on Coumadin PTA for hx PE.  Pt was bridged prior to surgery with Lovenox and continues on this post-op until INR>2.  Current INR 1.06.  Goal of Therapy:  INR 2-3   Plan:  Coumadin 5mg  PO x 1 tonight. Continue daily INR.  [employer], Pharm.D., BCPS Clinical Pharmacist Pager [phone] 07/26/2012 10:31 AM

## 2012-07-28 ENCOUNTER — Encounter (HOSPITAL_COMMUNITY): Payer: Self-pay | Admitting: Orthopedic Surgery

## 2012-07-28 LAB — TISSUE CULTURE: Culture: NO GROWTH

## 2012-07-30 LAB — ANAEROBIC CULTURE

## 2012-08-26 ENCOUNTER — Encounter: Payer: Self-pay | Admitting: Oncology

## 2012-08-26 ENCOUNTER — Telehealth: Payer: Self-pay | Admitting: *Deleted

## 2012-08-26 NOTE — Telephone Encounter (Signed)
Pt returned my call and I confirmed 09/13/12 appt w/ pt.  Mailed letter & calendar to pt.

## 2012-09-02 ENCOUNTER — Telehealth: Payer: Self-pay | Admitting: *Deleted

## 2012-09-02 ENCOUNTER — Ambulatory Visit: Payer: Medicare Other | Admitting: Oncology

## 2012-09-02 NOTE — Telephone Encounter (Signed)
Received a return phone call from patient to reschedule her appt. Confirmed appt. For 10/03/12 at 0845 with Melissa Cross,NP.

## 2012-09-02 NOTE — Telephone Encounter (Signed)
Left message for a return call to reschedule her appt.  

## 2012-09-13 ENCOUNTER — Ambulatory Visit: Payer: Medicare Other | Admitting: Gynecologic Oncology

## 2012-09-21 ENCOUNTER — Other Ambulatory Visit: Payer: Self-pay

## 2012-09-21 DIAGNOSIS — Z1231 Encounter for screening mammogram for malignant neoplasm of breast: Secondary | ICD-10-CM

## 2012-09-21 DIAGNOSIS — Z9012 Acquired absence of left breast and nipple: Secondary | ICD-10-CM

## 2012-10-03 ENCOUNTER — Telehealth: Payer: Self-pay | Admitting: *Deleted

## 2012-10-03 ENCOUNTER — Ambulatory Visit: Payer: Medicare Other | Admitting: Gynecologic Oncology

## 2012-10-03 ENCOUNTER — Telehealth: Payer: Self-pay | Admitting: Oncology

## 2012-10-03 NOTE — Telephone Encounter (Signed)
Pt called stating that she got snowed in and cannot make the appt.  Confirmed 3.27.14 appt w/ pt.

## 2012-10-03 NOTE — Telephone Encounter (Signed)
Pt called today to r/s appt w/[facility]. Former PR pt transferred to [employer]. Pt forwarded to lisa.

## 2012-10-13 ENCOUNTER — Encounter (INDEPENDENT_AMBULATORY_CARE_PROVIDER_SITE_OTHER): Payer: Self-pay | Admitting: General Surgery

## 2012-10-13 ENCOUNTER — Ambulatory Visit (HOSPITAL_BASED_OUTPATIENT_CLINIC_OR_DEPARTMENT_OTHER): Payer: Medicare Other | Admitting: Oncology

## 2012-10-13 VITALS — BP 114/74 | HR 79 | Temp 97.8°F | Resp 20 | Ht 66.0 in | Wt 223.4 lb

## 2012-10-13 DIAGNOSIS — C50912 Malignant neoplasm of unspecified site of left female breast: Secondary | ICD-10-CM

## 2012-10-13 DIAGNOSIS — Z9010 Acquired absence of unspecified breast and nipple: Secondary | ICD-10-CM

## 2012-10-13 DIAGNOSIS — Z853 Personal history of malignant neoplasm of breast: Secondary | ICD-10-CM

## 2012-10-13 DIAGNOSIS — Z170 Estrogen receptor positive status [ER+]: Secondary | ICD-10-CM

## 2012-10-15 NOTE — Progress Notes (Signed)
ID: Lydia Campbell   DOB: 1941/03/02  MR#: [id number]  CSN#:[id number]  PCP: Theodoro Kos, MD GYN:  SUChevis Pretty OTHER MD: Salvatore Marvel   HISTORY OF PRESENT ILLNESS: From Dr. Renelda Loma intake note 04/18/2008:  "This woman undergoes annual screening mammography at [facility].  She noted a mass in her left breast in July 2009.  She had a mammogram on 03/05/08.  Physical exam showed a palpable mass at 10 o'clock position 10 cm from the nipple.  The ultrasound showed a hypoechoic multilobulated mass at the 10 o'clock measuring 0.6 x 11 x 19 mm.  A biopsy was recommended.  Biopsy on 03/05/08 showed this to be invasive ductal cancer, ER and PR positive 74% and 61% respectively, HER-2 was 3+, proliferative index 50%.  She underwent a preoperative MRI scan; this showed a bilobed mass measuring 3.1 x 2.0 x 1.8 cm.  No other suspicious masses were seen.  She elected to undergo a lumpectomy.  She underwent lumpectomy and sentinel lymph node evaluation on 03/15/08.  This showed a 3 cm invasive ductal cancer with lymphovascular invasion grade 3 of 3.  14 lymph nodes were removed, all of which were negative for tumor.  Margins were negative for invasive cancer.  There is a deep margin at 1.3 mm.  DCIS was focally involved in the lateral margin.  As a result, she underwent a mastectomy on 03/28/08 and this showed residual tumor less than 0.3 cm.  Margins were all free.  No residual DCIS was seen and the patient has had an unremarkable postoperative course."  Her subsequent history is as detailed below.  INTERVAL HISTORY: Lydia Campbell returns today for followup of her breast cancer. She is establishing herself in my practice today.  REVIEW OF SYSTEMS: The interval history since her last visit here is generally unremarkable. She does have problems with aches and pains here in there, due to fibromyalgia and gout, but these are not more frequent, persistent, or intense than prior. She denies significant shortness of breath,  cough, phlegm production, or pleurisy. She does not exercise regularly. Her knees are her main problem at present. A detailed review of systems was otherwise noncontributory  PAST MEDICAL HISTORY: Past Medical History  Diagnosis Date  . GERD (gastroesophageal reflux disease)   . Asthma   . Blood transfusion 07/2008    "related to chemo" (07/25/2012)  . Clotting disorder     "sudden chest pain; found multiple blood clots in both lungs; they think it was from Tamoxifen" (07/25/2012)  . Heart murmur   . Hypertension   . H/O hiatal hernia   . Fibromyalgia   . Anxiety   . Subcutaneous hematoma 07/25/2012    Intraoperative cultures are pending on this fluid collection that was just outside the left knee joint.  Gram stain had few WBC monos and polys.  . Pulmonary embolism 02/2011  . Exertional dyspnea   . OSA on CPAP   . Hx of blood clots 02/2011    "sudden chest pain; found multiple blood clots in both lungs; they think it was from Tamoxifen; haven't had any since 11/2011" (07/25/2012)  . Breast cancer     breast lft  . Seizures     "seizures vs convulsions related to PCN when I was a child" (07/25/2012)  . Osteoarthritis     "pretty much everywhere" (07/25/2012)  . Right knee DJD   . Left knee DJD   . Chronic lower back pain   . Gout  PAST SURGICAL HISTORY: Past Surgical History  Procedure Laterality Date  . Knee arthroscopy  2001;  2009    "right; left" (07/25/2012)  . Total knee arthroplasty  12/21/2011    Procedure: TOTAL KNEE ARTHROPLASTY;  Surgeon: Nilda Simmer, MD;  Location: [facility] OR;  Service: Orthopedics;  Laterality: Right;  DR Thurston Hole WANTS 90 MINUTES FOR THIS CASE  . Tonsillectomy  1950  . Vaginal hysterectomy  ~ 1983    "endometrosis" (07/25/2012)  . Tubal ligation  ~ 1976  . Mastectomy modified radical / simple / complete  03/28/2008    "left" (07/25/2012)  . Breast surgery    . Breast lumpectomy  02/2008    "left" (07/25/2012)  . Portacath placement  03/28/2008; ?05/2008    "left;  right" (07/25/2012)  . Total knee arthroplasty with revision components  07/25/2012    Procedure: TOTAL KNEE ARTHROPLASTY WITH REVISION COMPONENTS;  Surgeon: Nilda Simmer, MD;  Location: [facility] OR;  Service: Orthopedics;  Laterality: Left;    FAMILY HISTORY Family History  Problem Relation Age of Onset  . Heart disease Mother   . Diabetes Father   . Heart disease Father   . Heart disease Brother    the patient's father died at the age of 7 from a stroke in the setting of diabetes. Her mother died at the age of 43 with congestive heart failure. The patient had one brother, 2 sisters. There is no history of breast or ovarian cancer in the immediate family.  GYNECOLOGIC HISTORY: Menarche age 17, first live birth age 87, the patient is GX P1. She underwent hysterectomy with bilateral salpingo-oophorectomy at age 34. She did not take hormone replacement.  SOCIAL HISTORY: Lydia Campbell used to work as a Conservation officer, nature for [employer], but is now retired. Her husband, Rex, is 48 years old. He works an Warden/ranger. The patient's son lives in [place]. She has 5 grandchildren.   ADVANCED DIRECTIVES:  HEALTH MAINTENANCE: History  Substance Use Topics  . Smoking status: Former Smoker -- 1.50 packs/day for 5 years    Types: Cigarettes    Quit date: 12/06/1968  . Smokeless tobacco: Never Used  . Alcohol Use: Yes     Comment: 07/25/2012 "bottle of champaign on our anniversary q yr; add a drink once in a blue moon for a celebration"     Colonoscopy:  PAP:  Bone density:  Lipid panel:  Allergies  Allergen Reactions  . Morphine And Related Nausea Only       . Penicillins Other (See Comments)    "seizures vs convulsions; I threw up and went out; last time was when I was 72 yr old" (07/25/2012)    Current Outpatient Prescriptions  Medication Sig Dispense Refill  . acetaminophen (TYLENOL) 325 MG tablet Take 2 tablets (650 mg total) by mouth every 6 (six) hours as needed for fever (or Fever >/= 101).      Marland Kitchen  allopurinol (ZYLOPRIM) 300 MG tablet Take 300 mg by mouth daily.       . Ascorbic Acid (VITAMIN C) 1000 MG tablet Take 1,000 mg by mouth daily.       . B Complex-Biotin-FA (B-COMPLEX PO) Take 100 mg by mouth at bedtime.       . Biotin 5000 MCG CAPS Take 1 capsule by mouth daily.       . budesonide (PULMICORT) 0.25 MG/2ML nebulizer solution Take 0.25 mg by nebulization daily.      . calcium carbonate (CALCIUM 500) 1250 MG tablet Take 1  tablet by mouth daily.      . celecoxib (CELEBREX) 200 MG capsule Take 1 capsule (200 mg total) by mouth daily.  30 capsule  0  . cetirizine (ZYRTEC) 10 MG tablet Take 10 mg by mouth daily.       . cholecalciferol (VITAMIN D) 400 UNITS TABS Take 400 Units by mouth daily.       . ciprofloxacin (CIPRO) 500 MG/5ML (10%) suspension Take 500 mg by mouth 2 (two) times daily.      Marland Kitchen docusate sodium 100 MG CAPS 1 tab 2 times a day while on narcotics.  STOOL SOFTENER  10 capsule    . enoxaparin (LOVENOX) 30 MG/0.3ML injection 1 injection every 12 hrs until INR 2.0 or higher  10 Syringe  0  . hydroxypropyl methylcellulose (ISOPTO TEARS) 2.5 % ophthalmic solution Place 1 drop into both eyes 4 (four) times daily as needed. Dry eyes      . MAGNESIUM PO Take 50 mg by mouth 3 (three) times daily.       . NON FORMULARY Patient uses a C PAP machine      . omeprazole (PRILOSEC) 20 MG capsule Take 20 mg by mouth daily.       Marland Kitchen oxyCODONE (OXY IR/ROXICODONE) 5 MG immediate release tablet Take 1-2 tablets (5-10 mg total) by mouth every 3 (three) hours as needed.  60 tablet  0  . pantoprazole (PROTONIX) 40 MG tablet Take 40 mg by mouth daily.       . pimecrolimus (ELIDEL) 1 % cream Apply 1 application topically daily.      . Potassium Chloride Crys CR (KLOR-CON M20 PO) Take 1 tablet by mouth 2 (two) times daily.       . traZODone (DESYREL) 50 MG tablet Take 50 mg by mouth at bedtime.       . triamterene-hydrochlorothiazide (MAXZIDE) 75-50 MG per tablet Take 1 tablet by mouth daily.        Marland Kitchen warfarin (COUMADIN) 5 MG tablet Take 2.5-5 mg by mouth daily. Alternates every other day. Warfarin 5mg  due Monday 07/11/12 then 2.5mg  due Tuesday 07/12/12       No current facility-administered medications for this visit.    OBJECTIVE: Middle-aged white woman in no acute distress Filed Vitals:   10/13/12 0857  BP: 114/74  Pulse: 79  Temp: 97.8 F (36.6 C)  Resp: 20     Body mass index is 36.08 kg/(m^2).    ECOG FS: 1  Sclerae unicteric Oropharynx clear No cervical or supraclavicular adenopathy Lungs no rales or rhonchi Heart regular rate and rhythm Abd benign MSK no focal spinal tenderness, chronic bilateral lower extremity edema Neuro: nonfocal, well oriented, pleasant affect Breasts: The right breast is unremarkable; the left breast is status post mastectomy; there is no evidence of local recurrence; the left axilla is benign   LAB RESULTS: Lab Results  Component Value Date   WBC 11.2* 07/26/2012   NEUTROABS 7.4 07/18/2012   HGB 11.1* 07/26/2012   HCT 32.9* 07/26/2012   MCV 94.5 07/26/2012   PLT 184 07/26/2012      Chemistry      Component Value Date/Time   NA 136 07/26/2012 0605   K 3.8 07/26/2012 0605   CL 101 07/26/2012 0605   CO2 23 07/26/2012 0605   BUN 19 07/26/2012 0605   CREATININE 0.76 07/26/2012 0605      Component Value Date/Time   CALCIUM 8.3* 07/26/2012 0605   ALKPHOS 64 07/18/2012 1019   AST  22 07/18/2012 1019   ALT 19 07/18/2012 1019   BILITOT 0.2* 07/18/2012 1019       Lab Results  Component Value Date   LABCA2 25 02/27/2011    No components found with this basename: [id number]    No results found for this basename: INR,  in the last 168 hours  Urinalysis    Component Value Date/Time   COLORURINE YELLOW 07/18/2012 1019   APPEARANCEUR CLEAR 07/18/2012 1019   LABSPEC 1.015 07/18/2012 1019   PHURINE 6.0 07/18/2012 1019   GLUCOSEU NEGATIVE 07/18/2012 1019   HGBUR NEGATIVE 07/18/2012 1019   BILIRUBINUR NEGATIVE 07/18/2012 1019   KETONESUR NEGATIVE  07/18/2012 1019   PROTEINUR NEGATIVE 07/18/2012 1019   UROBILINOGEN 0.2 07/18/2012 1019   NITRITE NEGATIVE 07/18/2012 1019   LEUKOCYTESUR NEGATIVE 07/18/2012 1019    STUDIES: Right mammography 10/29/2011 was unremarkable  ASSESSMENT: 72 y.o. [place], [place] woman   (1) status post left lumpectomy and axillary lymph node dissection 03/15/2008 for a pT2 pN0, stage IIA invasive ductal carcinoma, grade 3, estrogen receptor 74% positive, progesterone receptor 61% positive, with an MIB-1 of 50% and HER-2 amplified by the HercepTest (3+)  (2) status post left mastectomy 03/28/2008 to clear margins.  (3) completed 6 cycles of carboplatin and docetaxel 09/13/2008  (4) received one year of trastuzumab between 05/10/2008 and 04/11/2009  (5) unable to tolerate aromatase inhibitors after multiple trials.  (6) on tamoxifen until August of 2012, when she was documented by spiral CT scan at [facility] at to have bilateral pulmonary emboli. Now followed off treatment.  PLAN: We went over her situation in detail. At the time of her initial surgery, the adjuvant! Program would of quoted a risk of dying from this tumor with local treatment only of 22%. She lowered that risk by about 2% with her chemotherapy, but cut it in half with the Herceptin. This would leave her an approximately 10% risk of death, but she did take some antiestrogen therapy and so the risk is going to be less than 10% at this point. She has tried aromatase inhibitors but with at best will in the world was not able to tolerate them, and it would not be safe for her to return to tamoxifen.  Accordingly at this point I am comfortable releasing her to her primary care physician. She also sees Dr. Carolynne Edouard on a yearly basis. All she needs is a yearly right mammogram, which she gets in April, and a yearly physician breast exam. Tiny knows that we will be happy to see her in the future if and when the need arises, but as of now we're making  no further appointments for her here.   MAGRINAT,GUSTAV C    10/15/2012

## 2012-10-17 ENCOUNTER — Telehealth: Payer: Self-pay | Admitting: Oncology

## 2012-10-17 NOTE — Telephone Encounter (Signed)
Sent letter to Dr. Patrecia Pace office from Dr. Darnelle Catalan

## 2012-10-31 ENCOUNTER — Ambulatory Visit
Admission: RE | Admit: 2012-10-31 | Discharge: 2012-10-31 | Disposition: A | Discharge status: Home / Self Care | Payer: Medicare Other | Source: Ambulatory Visit

## 2012-10-31 DIAGNOSIS — Z853 Personal history of malignant neoplasm of breast: Secondary | ICD-10-CM

## 2012-10-31 DIAGNOSIS — Z9012 Acquired absence of left breast and nipple: Secondary | ICD-10-CM

## 2012-10-31 DIAGNOSIS — Z1231 Encounter for screening mammogram for malignant neoplasm of breast: Secondary | ICD-10-CM

## 2013-04-18 ENCOUNTER — Other Ambulatory Visit (INDEPENDENT_AMBULATORY_CARE_PROVIDER_SITE_OTHER): Payer: Self-pay | Admitting: *Deleted

## 2013-04-18 DIAGNOSIS — C50919 Malignant neoplasm of unspecified site of unspecified female breast: Secondary | ICD-10-CM

## 2013-04-18 MED ORDER — UNABLE TO FIND: Status: AC

## 2013-05-01 ENCOUNTER — Telehealth (INDEPENDENT_AMBULATORY_CARE_PROVIDER_SITE_OTHER): Payer: Self-pay

## 2013-05-01 NOTE — Telephone Encounter (Signed)
LMOM> pt needs to r/s her appt on Friday since Dr Carolynne Edouard office was cancelled. Please reschedule to next avail long term appt that's available.

## 2013-05-05 ENCOUNTER — Ambulatory Visit (INDEPENDENT_AMBULATORY_CARE_PROVIDER_SITE_OTHER): Payer: Medicare Other | Admitting: General Surgery

## 2013-05-11 ENCOUNTER — Encounter (INDEPENDENT_AMBULATORY_CARE_PROVIDER_SITE_OTHER): Payer: Self-pay | Admitting: General Surgery

## 2013-05-11 ENCOUNTER — Ambulatory Visit (INDEPENDENT_AMBULATORY_CARE_PROVIDER_SITE_OTHER): Payer: Medicare Other | Admitting: General Surgery

## 2013-05-11 VITALS — BP 120/70 | HR 72 | Temp 97.2°F | Resp 14 | Ht 67.5 in | Wt 232.0 lb

## 2013-05-11 DIAGNOSIS — C50919 Malignant neoplasm of unspecified site of unspecified female breast: Secondary | ICD-10-CM

## 2013-05-11 DIAGNOSIS — C50912 Malignant neoplasm of unspecified site of left female breast: Secondary | ICD-10-CM

## 2013-05-11 NOTE — Patient Instructions (Signed)
Continue regular self exams  

## 2013-05-11 NOTE — Progress Notes (Signed)
Subjective:     Patient ID: Lydia Campbell, female   DOB: 1941/07/03, 72 y.o.   MRN: [id number]  HPI The patient is a 72 year old white female who is 5 years status post left mastectomy for a T2 N0 left breast cancer. She had to discontinue antiestrogen therapy secondary to pulmonary emboli. Since her last visit she had another knee replacement and feels much better. She denies any chest wall pain. Her last mammogram was in April that showed no evidence of malignancy  Review of Systems  Constitutional: Negative.   HENT: Negative.   Eyes: Negative.   Respiratory: Negative.   Cardiovascular: Negative.   Gastrointestinal: Negative.   Endocrine: Negative.   Genitourinary: Negative.   Musculoskeletal: Negative.   Skin: Negative.   Allergic/Immunologic: Negative.   Neurological: Negative.   Hematological: Negative.   Psychiatric/Behavioral: Negative.        Objective:   Physical Exam  Constitutional: She is oriented to person, place, and time. She appears well-developed and well-nourished.  HENT:  Head: Normocephalic and atraumatic.  Eyes: Conjunctivae and EOM are normal. Pupils are equal, round, and reactive to light.  Neck: Normal range of motion. Neck supple.  Cardiovascular: Normal rate, regular rhythm and normal heart sounds.   Pulmonary/Chest: Effort normal and breath sounds normal.  There is no palpable mass in the left chest wall. There is no palpable mass of the right breast. There is no palpable axillary, supraclavicular, or cervical lymphadenopathy.  Abdominal: Soft. Bowel sounds are normal. She exhibits no mass. There is no tenderness.  Musculoskeletal: Normal range of motion.  Lymphadenopathy:    She has no cervical adenopathy.  Neurological: She is alert and oriented to person, place, and time.  Skin: Skin is warm and dry.  Psychiatric: She has a normal mood and affect. Her behavior is normal.       Assessment:     The patient is 5 years status post left  mastectomy for breast cancer     Plan:     At this point she will continue to do regular self exams. I will plan to see her back on a yearly basis.

## 2013-05-25 ENCOUNTER — Other Ambulatory Visit: Payer: Self-pay

## 2013-10-26 ENCOUNTER — Other Ambulatory Visit: Payer: Self-pay

## 2013-10-26 DIAGNOSIS — Z853 Personal history of malignant neoplasm of breast: Secondary | ICD-10-CM

## 2013-10-26 DIAGNOSIS — Z9012 Acquired absence of left breast and nipple: Secondary | ICD-10-CM

## 2013-10-26 DIAGNOSIS — Z1231 Encounter for screening mammogram for malignant neoplasm of breast: Secondary | ICD-10-CM

## 2013-11-01 ENCOUNTER — Ambulatory Visit
Admission: RE | Admit: 2013-11-01 | Discharge: 2013-11-01 | Disposition: A | Discharge status: Home / Self Care | Payer: Medicare Other | Source: Ambulatory Visit

## 2013-11-01 DIAGNOSIS — Z1231 Encounter for screening mammogram for malignant neoplasm of breast: Secondary | ICD-10-CM

## 2013-11-01 DIAGNOSIS — Z853 Personal history of malignant neoplasm of breast: Secondary | ICD-10-CM

## 2013-11-01 DIAGNOSIS — Z9012 Acquired absence of left breast and nipple: Secondary | ICD-10-CM

## 2014-01-09 IMAGING — CT CT ABD-PELV W/ CM
1 of 5 series · 14 of 36 positions shown, 19 images · IV contrast (READICAT/WATER & [ID] OMNI [id number])
Comparison: CT abdomen pelvis of 04/24/2008

CLINICAL DATA: Episodes of abdominal cramping, diarrhea

CT ABDOMEN AND PELVIS WITH CONTRAST
TECHNIQUE: Multidetector CT imaging of the abdomen and pelvis was
performed following the standard protocol during bolus
administration of intravenous contrast.
Contrast:  125 ml 1mnipaque-CVV

[Series 3: abd/pelvis with · axial · 0.82mm/px · z∈[-375,+25]mm · 14 of 92 slices shown, 19 images]
[im 6/92  soft-tissue]
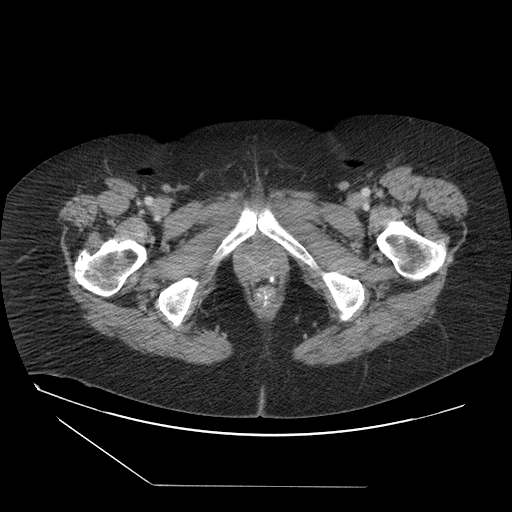
[im 6/92  bone]
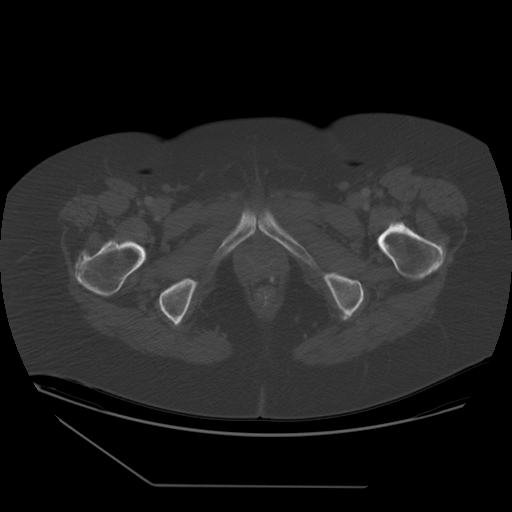
[im 11/92  soft-tissue]
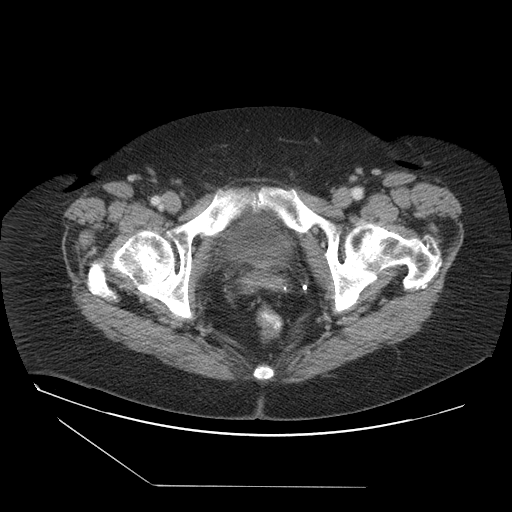
[im 21/92  soft-tissue]
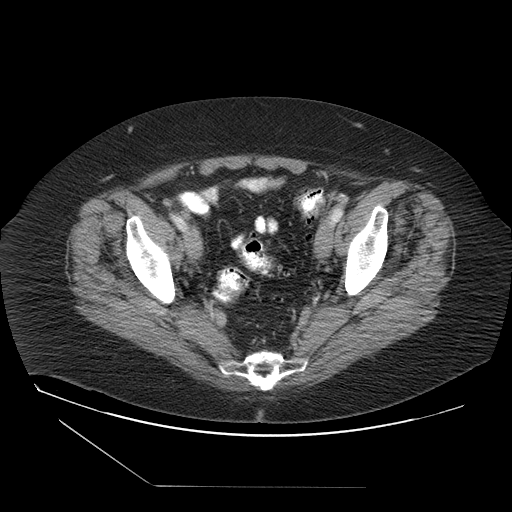
[im 26/92  soft-tissue]
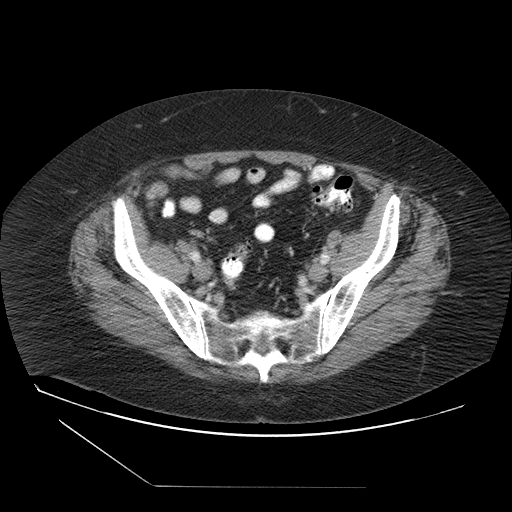
[im 31/92  soft-tissue]
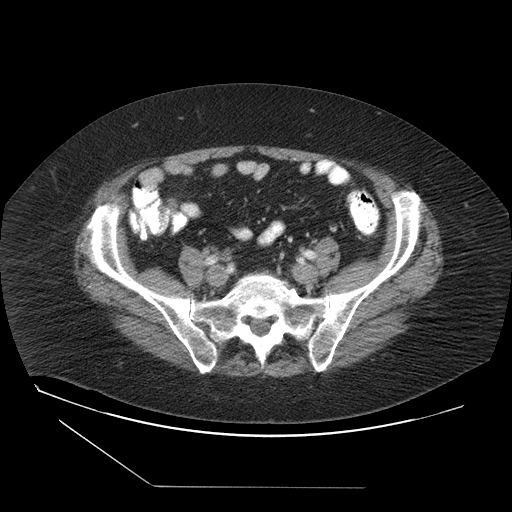
[im 41/92  soft-tissue]
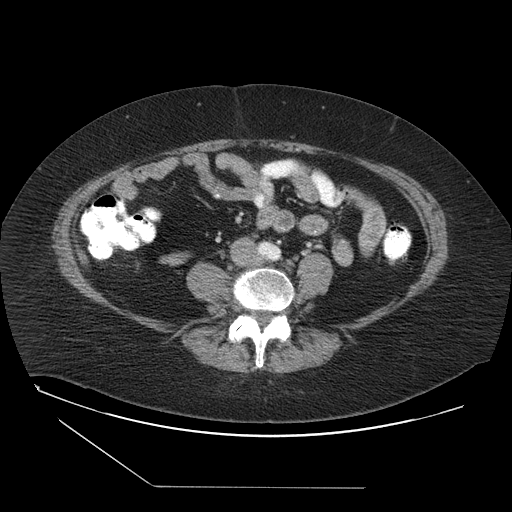
[im 46/92  soft-tissue]
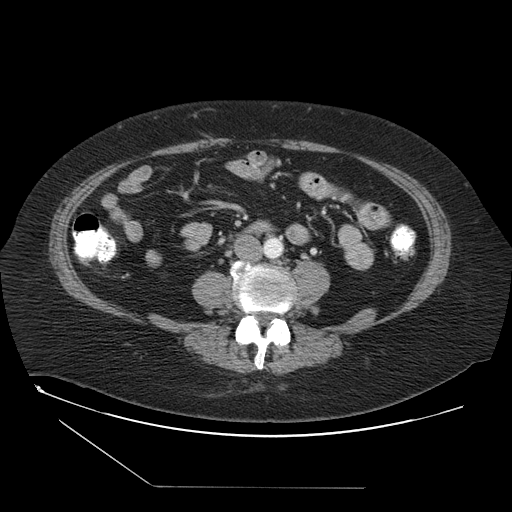
[im 51/92  soft-tissue]
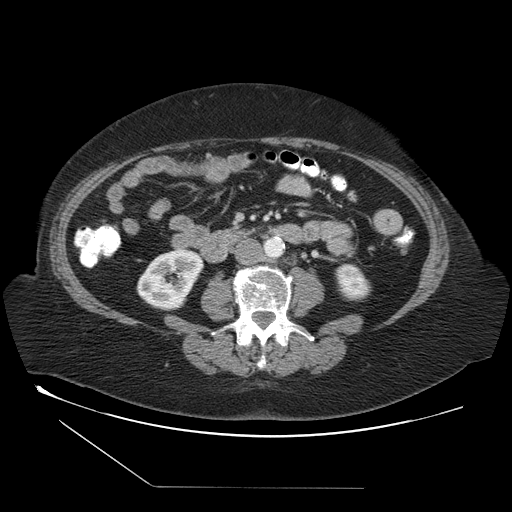
[im 61/92  soft-tissue]
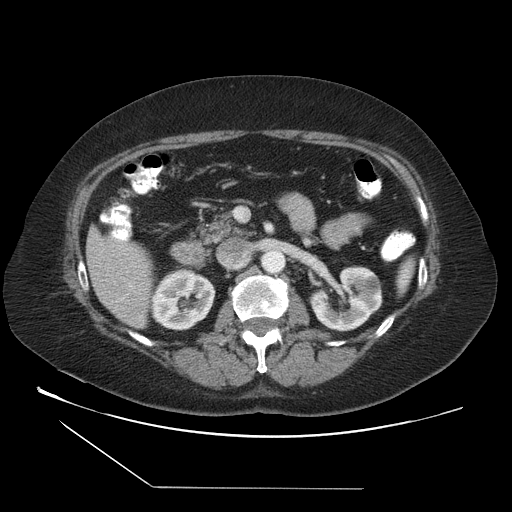
[im 61/92  bone]
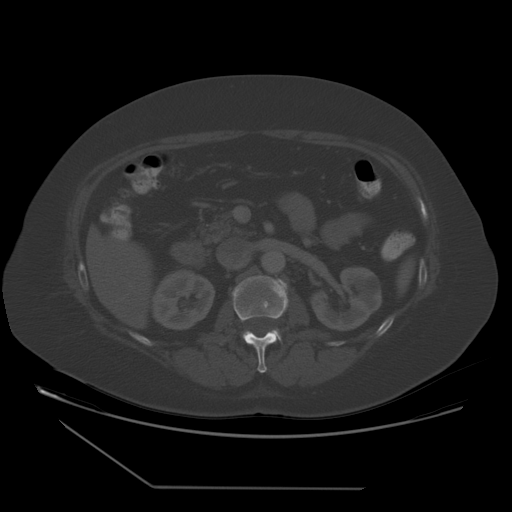
[im 66/92  soft-tissue]
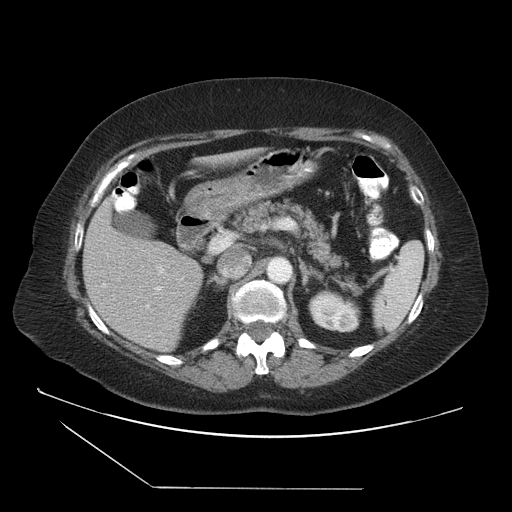
[im 71/92  soft-tissue]
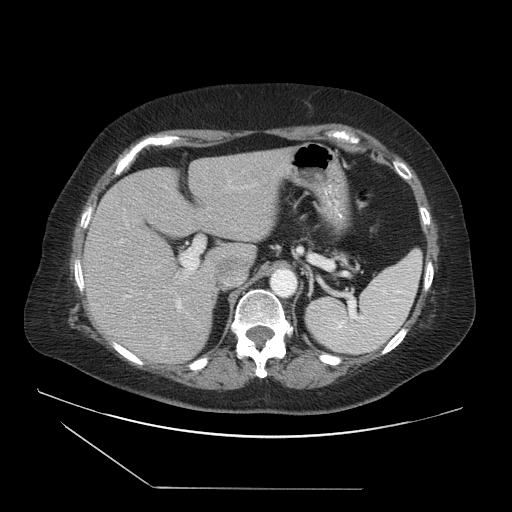
[im 71/92  lung]
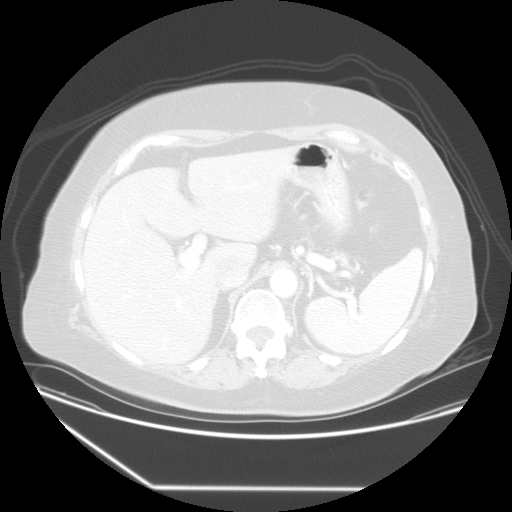
[im 76/92  lung]
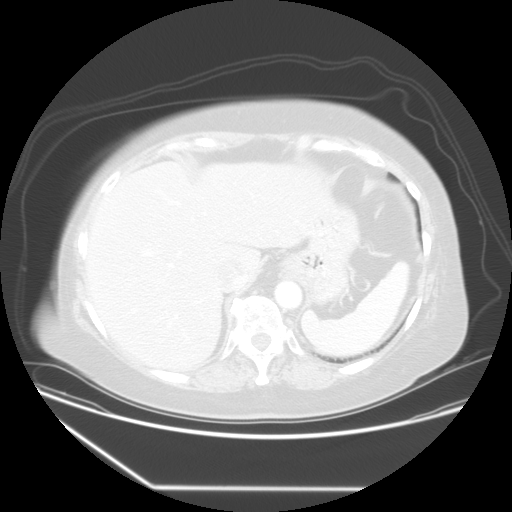
[im 81/92  soft-tissue]
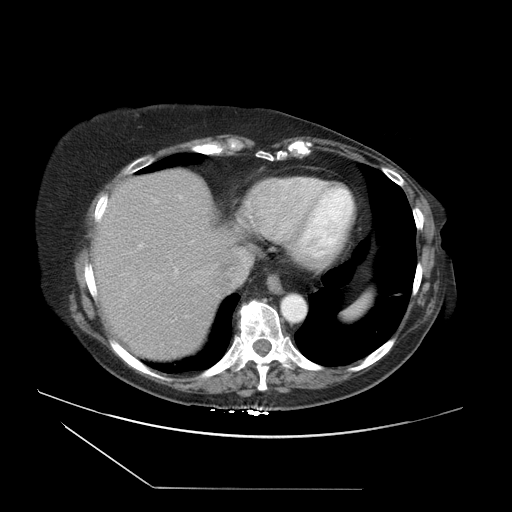
[im 81/92  lung]
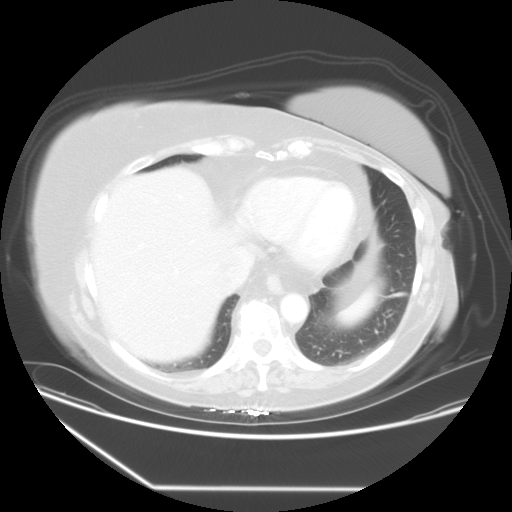
[im 86/92  soft-tissue]
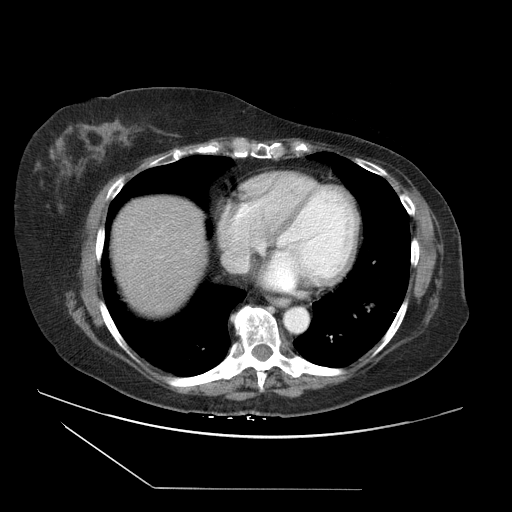
[im 86/92  lung]
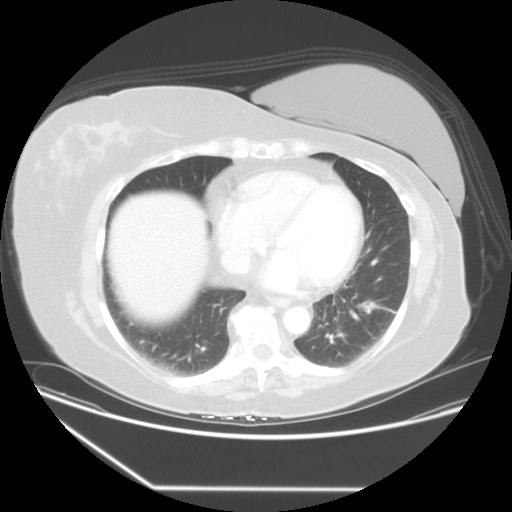

[14 of 36 positions shown; findings below may reference images not displayed]

FINDINGS: Linear scarring in the left lower lobe is stable.
Changes of left mastectomy are noted.  The liver enhances with no
focal abnormality and no ductal dilatation is seen.  No calcified
gallstones are noted.  The pancreas is normal in size and the
pancreatic duct is not dilated.  The adrenal glands and spleen are
unremarkable, with a few small calcified splenic granulomas
present.  The stomach is not well distended. The kidneys enhance
with no change in a cyst in the anterior left kidney.  No renal
calculi are seen and no hydronephrosis is noted.  On delayed images
the pelvocaliceal systems are normal.  The abdominal aorta is
normal in caliber.  No adenopathy is seen.

Multiple rectosigmoid colonic diverticula are present.  No present
evidence of diverticulitis is seen.  Diverticula also scattered
throughout the remainder of the colon.  The terminal ileum and
appendix are unremarkable.  The urinary bladder is decompressed and
cannot be evaluated.  Degenerative disc disease is present at the
L5-S1 level.
IMPRESSION: 1.  Multiple colonic diverticula primarily concentrated within the
rectosigmoid colon.  No diverticulitis.
2.  No renal calculi.  No hydronephrosis.
3.  Degenerative disc disease at L5-S1.

## 2014-08-01 ENCOUNTER — Ambulatory Visit: Payer: Medicare Other | Admitting: Cardiology

## 2014-08-28 ENCOUNTER — Ambulatory Visit: Payer: Medicare Other | Admitting: Cardiology

## 2014-10-11 ENCOUNTER — Ambulatory Visit (INDEPENDENT_AMBULATORY_CARE_PROVIDER_SITE_OTHER): Payer: Medicare Other | Admitting: Cardiology

## 2014-10-11 ENCOUNTER — Encounter: Payer: Self-pay | Admitting: Cardiology

## 2014-10-11 VITALS — BP 118/74 | HR 83 | Ht 67.5 in | Wt 240.0 lb

## 2014-10-11 DIAGNOSIS — R9439 Abnormal result of other cardiovascular function study: Secondary | ICD-10-CM | POA: Diagnosis not present

## 2014-10-11 DIAGNOSIS — I10 Essential (primary) hypertension: Secondary | ICD-10-CM

## 2014-10-11 DIAGNOSIS — R011 Cardiac murmur, unspecified: Secondary | ICD-10-CM | POA: Diagnosis not present

## 2014-10-11 DIAGNOSIS — R0609 Other forms of dyspnea: Secondary | ICD-10-CM

## 2014-10-11 NOTE — Patient Instructions (Signed)
Your physician recommends that you continue on your current medications as directed. Please refer to the Current Medication list given to you today.    Your physician has requested that you have an echocardiogram. Echocardiography is a painless test that uses sound waves to create images of your heart. It provides your doctor with information about the size and shape of your heart and how well your heart's chambers and valves are working. This procedure takes approximately one hour. There are no restrictions for this procedure.     Your physician has requested that you have en exercise stress myoview. For further information please visit [email or web address]. Please follow instruction sheet, as given.    Your physician wants you to follow-up in: 2 [place] will receive a reminder letter in the mail two months in advance. If you don't receive a letter, please call our office to schedule the follow-up appointment.

## 2014-10-11 NOTE — Progress Notes (Signed)
Patient ID: Lydia Campbell, female   DOB: 1940/11/12, 74 y.o.   MRN: [id number]      Cardiology Office Note   Date:  10/11/2014   ID:  Lydia Campbell, DOB [birth year], MRN [id number]  PCP:  Galen Manila, MD  Cardiologist:  Dorothy Spark, MD   Chief complain: chest pain, DOE    History of Present Illness: Lydia Campbell is a 74 y.o. female with prior medical h/o obesity, hypertension, breast cancer post chemotherapy, pulmonary embolism in 2012 on chronic therapy with coumadin who presented with chest pain. She states that she likes to walk and feels ok while doing it but afterwards or a day after she will feel significant chest pain across her chest. No associated SOB or palpitations. No orthopnea or PND. No syncope. The pain is pressure like. Overall she has noticed DOE with moderate activity.  She has significant h/o premature CAD, father had MI in 33' and CVA. Brother MI at 14, also CVA. She quit smoking in 1970'..    Past Medical History  Diagnosis Date  . GERD (gastroesophageal reflux disease)   . Asthma   . Blood transfusion 07/2008    "related to chemo" (07/25/2012)  . Clotting disorder     "sudden chest pain; found multiple blood clots in both lungs; they think it was from Tamoxifen" (07/25/2012)  . Heart murmur   . Hypertension   . H/O hiatal hernia   . Fibromyalgia   . Anxiety   . Subcutaneous hematoma 07/25/2012    Intraoperative cultures are pending on this fluid collection that was just outside the left knee joint.  Gram stain had few WBC monos and polys.  . Pulmonary embolism 02/2011  . Exertional dyspnea   . OSA on CPAP   . Hx of blood clots 02/2011    "sudden chest pain; found multiple blood clots in both lungs; they think it was from Tamoxifen; haven't had any since 11/2011" (07/25/2012)  . Breast cancer     breast lft  . Seizures     "seizures vs convulsions related to PCN when I was a child" (07/25/2012)  . Osteoarthritis     "pretty much everywhere"  (07/25/2012)  . Right knee DJD   . Left knee DJD   . Chronic lower back pain   . Gout     Past Surgical History  Procedure Laterality Date  . Knee arthroscopy  2001;  2009    "right; left" (07/25/2012)  . Total knee arthroplasty  12/21/2011    Procedure: TOTAL KNEE ARTHROPLASTY;  Surgeon: Lorn Junes, MD;  Location: [facility];  Service: Orthopedics;  Laterality: Right;  DR [place] THIS CASE  . Tonsillectomy  1950  . Vaginal hysterectomy  ~ 1983    "endometrosis" (07/25/2012)  . Tubal ligation  ~ 1976  . Mastectomy modified radical / simple / complete  03/28/2008    "left" (07/25/2012)  . Breast surgery    . Breast lumpectomy  02/2008    "left" (07/25/2012)  . Portacath placement  03/28/2008; ?05/2008    "left; right" (07/25/2012)  . Total knee arthroplasty with revision components  07/25/2012    Procedure: TOTAL KNEE ARTHROPLASTY WITH REVISION COMPONENTS;  Surgeon: Lorn Junes, MD;  Location: [facility];  Service: Orthopedics;  Laterality: Left;     Current Outpatient Prescriptions  Medication Sig Dispense Refill  . acetaminophen (TYLENOL) 325 MG tablet Take 2 tablets (650 mg total) by mouth every 6 (six)  hours as needed for fever (or Fever >/= 101).    . ADVAIR DISKUS 250-50 MCG/DOSE AEPB As directed    . allopurinol (ZYLOPRIM) 300 MG tablet Take 300 mg by mouth daily.     . Ascorbic Acid (VITAMIN C) 1000 MG tablet Take 1,000 mg by mouth daily.     . B Complex-Biotin-FA (B-COMPLEX PO) Take 100 mg by mouth at bedtime.     . Biotin 5000 MCG CAPS Take 1 capsule by mouth daily.     . calcium carbonate (CALCIUM 500) 1250 MG tablet Take 1 tablet by mouth daily.    . cetirizine (ZYRTEC) 10 MG tablet Take 10 mg by mouth daily.     . cholecalciferol (VITAMIN D) 400 UNITS TABS Take 400 Units by mouth daily.     . hydroxypropyl methylcellulose (ISOPTO TEARS) 2.5 % ophthalmic solution Place 1 drop into both eyes 4 (four) times daily as needed. Dry eyes    . MAGNESIUM PO Take 50 mg by  mouth 3 (three) times daily.     . nabumetone (RELAFEN) 500 MG tablet     . NON FORMULARY Patient uses a C PAP machine    . omeprazole (PRILOSEC) 20 MG capsule Take 20 mg by mouth daily.     . pantoprazole (PROTONIX) 40 MG tablet Take 40 mg by mouth daily.     . pimecrolimus (ELIDEL) 1 % cream Apply 1 application topically daily.    . polyethylene glycol (MIRALAX / GLYCOLAX) packet Take 17 g by mouth daily.    . Potassium Chloride Crys CR (KLOR-CON M20 PO) Take 1 tablet by mouth 2 (two) times daily.     . traZODone (DESYREL) 50 MG tablet Take 50 mg by mouth at bedtime.     . triamterene-hydrochlorothiazide (MAXZIDE) 75-50 MG per tablet Take 1 tablet by mouth daily.     Marland Kitchen UNABLE TO FIND Rx: L8000- Post Surgical Bras (Quantity: 6) Dx: 174.9; Left Mastectomy 1 each 0  . warfarin (COUMADIN) 5 MG tablet Take 2.5-5 mg by mouth daily. Alternates every other day. Warfarin 5mg  due Monday 07/11/12 then 2.5mg  due Tuesday 07/12/12     No current facility-administered medications for this visit.    Allergies:   Morphine and related and Penicillins    Social History:  The patient  reports that she quit smoking about 45 years ago. Her smoking use included Cigarettes. She has a 7.5 pack-year smoking history. She has never used smokeless tobacco. She reports that she drinks alcohol. She reports that she does not use illicit drugs.   Family History:  The patient's family history includes CVA in her father; Diabetes in her father; Heart disease in her brother, father, and mother.    ROS:  Please see the history of present illness.  All other systems are reviewed and negative.    PHYSICAL EXAM: VS:  BP 118/74 mmHg  Pulse 83  Ht 5' 7.5" (1.715 m)  Wt 240 lb (108.863 kg)  BMI 37.01 kg/m2 , BMI Body mass index is 37.01 kg/(m^2). GEN: Well nourished, well developed, in no acute distress HEENT: normal Neck: no JVD, carotid bruits, or masses Cardiac: RRR; 2/6 systolic murmur, no rubs, or gallops,no edema    Respiratory:  clear to auscultation bilaterally, normal work of breathing GI: soft, nontender, nondistended, + BS MS: no deformity or atrophy Skin: warm and dry, no rash Neuro:  Strength and sensation are intact Psych: euthymic mood, full affect   EKG:  EKG is ordered today. The  ekg ordered today demonstrates SR, low\ voltage, unchanged from 12/24/2011.  03/05/2012 Left ventricle: The cavity size was normal. Systolic function was  normal. The estimated ejection fraction was in the range of 60%  to 65%. Wall motion was normal; there were no regional wall  motion abnormalities. 2. Mitral valve: Mild regurgitation.   Recent Labs: No results found for requested labs within last 365 days.    Lipid Panel No results found for: CHOL, TRIG, HDL, CHOLHDL, VLDL, LDLCALC, LDLDIRECT    Wt Readings from Last 3 Encounters:  10/11/14 240 lb (108.863 kg)  05/11/13 232 lb (105.235 kg)  10/13/12 223 lb 6.4 oz (101.334 kg)     ASSESSMENT AND PLAN:  1. Chest pain - rather atypical, however postmenopausal female with significant risk factors including obesity, hyperlipidemia, FH of CAD. - we will order exercise nuclear stress test  2. DOE -  - h/o chemotherapy - order echocardiogram   3. Systolic murmur - obtain echocardiogram  4. Lipids - we will obtain labs from PCP  Follow up in 2 years if all tests are negative.    Signed, Dorothy Spark, MD  10/11/2014 2:23 PM    [facility] [place], [place], [place]  [address] Phone: [phone]; Fax: [phone]

## 2014-10-19 ENCOUNTER — Other Ambulatory Visit: Payer: Self-pay

## 2014-10-19 DIAGNOSIS — Z1231 Encounter for screening mammogram for malignant neoplasm of breast: Secondary | ICD-10-CM

## 2014-10-22 ENCOUNTER — Ambulatory Visit (HOSPITAL_COMMUNITY): Payer: Medicare Other | Attending: Cardiology | Admitting: Radiology

## 2014-10-22 ENCOUNTER — Ambulatory Visit (HOSPITAL_BASED_OUTPATIENT_CLINIC_OR_DEPARTMENT_OTHER): Payer: Medicare Other | Admitting: Radiology

## 2014-10-22 DIAGNOSIS — R0609 Other forms of dyspnea: Secondary | ICD-10-CM

## 2014-10-22 DIAGNOSIS — R011 Cardiac murmur, unspecified: Secondary | ICD-10-CM

## 2014-10-22 DIAGNOSIS — I10 Essential (primary) hypertension: Secondary | ICD-10-CM | POA: Insufficient documentation

## 2014-10-22 MED ORDER — TECHNETIUM TC 99M SESTAMIBI GENERIC - CARDIOLITE
11.0000 | Freq: Once | INTRAVENOUS | Status: AC | PRN
  Administered 2014-10-22: 11 via INTRAVENOUS

## 2014-10-22 MED ORDER — TECHNETIUM TC 99M SESTAMIBI GENERIC - CARDIOLITE
33.0000 | Freq: Once | INTRAVENOUS | Status: AC | PRN
  Administered 2014-10-22: 33 via INTRAVENOUS

## 2014-10-22 MED ORDER — REGADENOSON 0.4 MG/5ML IV SOLN: 0.4000 mg | Freq: Once | INTRAVENOUS | Status: DC

## 2014-10-22 NOTE — Progress Notes (Signed)
[facility] [address] [place], [place] [address] [phone]    Cardiology Nuclear Med Study  Lydia Campbell is a 74 y.o. female     MRN : [id number]     DOB: 30-May-1941  Procedure Date: 10/22/2014  Nuclear Med Background Indication for Stress Test:  Evaluation for Ischemia History:  Asthma and > 20 yrs ago MPI,  Cardiac Risk Factors: Hypertension  Symptoms:  Chest Pain and DOE   Nuclear Pre-Procedure Caffeine/Decaff Intake:  8:00pm NPO After: 7:00am   Lungs:  clear O2 Sat: 96% on room air. IV 0.9% NS with Angio Cath:  22g  IV Site: R Hand  IV Started by:  Matilde Haymaker, RN  Chest Size (in):  38 Cup Size: F; Patient had mastectomy of Left breast  Height: 5\' 7"  (1.702 m)  Weight:  240 lb (108.863 kg)  BMI:  Body mass index is 37.58 kg/(m^2). Tech Comments:  n/a    Nuclear Med Study 1 or 2 day study: 1 day  Stress Test Type:  Stress  Reading MD: n/a  Order Authorizing Provider:  Filiberto Pinks  Resting Radionuclide: Technetium 55m Sestamibi  Resting Radionuclide Dose: 11.0 mCi   Stress Radionuclide:  Technetium 9m Sestamibi  Stress Radionuclide Dose: 33.0 mCi           Stress Protocol Rest HR: 55 Stress HR: 139  Rest BP: 115/69 Stress BP: 177/60  Exercise Time (min): 3:00 METS: 4.60   Predicted Max HR: 149 bpm % Max HR: 93.29 bpm Rate Pressure Product: [phone]   Dose of Adenosine (mg):  n/a Dose of Lexiscan: n/a mg  Dose of Atropine (mg): n/a Dose of Dobutamine: n/a mcg/kg/min (at max HR)  Stress Test Technologist: Perrin Maltese, EMT-P  Nuclear Technologist:  Earl Many, CNMT     Rest Procedure:  Myocardial perfusion imaging was performed at rest 45 minutes following the intravenous administration of Technetium 37m Sestamibi. Rest ECG: NSR - Normal EKG  Stress Procedure:  The patient exercised on the treadmill utilizing the Bruce Protocol for 3:00 minutes. The patient stopped due to fatigue sob and denied any  chest pain.  Technetium 56m Sestamibi was injected at peak exercise and myocardial perfusion imaging was performed after a brief delay. Stress ECG: Significant ST abnormalities consistent with ischemia.  QPS Raw Data Images:  Normal; no motion artifact; normal heart/lung ratio. Stress Images:  There is decreased uptake in the inferior wall. Rest Images:  There is decreased uptake in the inferior wall. Subtraction (SDS):  These findings are consistent with ischemia. Transient Ischemic Dilatation (Normal <1.22):  1.05 Lung/Heart Ratio (Normal <0.45):  0.35  Quantitative Gated Spect Images QGS EDV:  90 ml QGS ESV:  31 ml  Impression Exercise Capacity:  Poor exercise capacity. BP Response:  Normal blood pressure response. Clinical Symptoms:  There is dyspnea. ECG Impression:  Significant ST abnormalities consistent with ischemia. Comparison with Prior Nuclear Study: No images to compare  Overall Impression:  Intermediate risk stress nuclear study.  The patient has very poor exercise tolerance, reached target in only three minutes. No chest pain. He developed dyspnea and fatigue. Perfusion images show decreased inferior wall perfusion in both stress and rest images. There appears to be partial reversibility with SDS = 8.  EKG post- exercise shows 1 mm ST depression with slight uprising slope in V3-V6.  Duke TM score is -3.  LV Ejection Fraction: 65%.  LV Wall Motion:  NL LV Function; NL Wall Motion  Darlin Coco MD

## 2014-10-22 NOTE — Progress Notes (Signed)
Echocardiogram performed.  

## 2014-10-23 ENCOUNTER — Telehealth: Payer: Self-pay | Admitting: *Deleted

## 2014-10-23 ENCOUNTER — Encounter: Payer: Self-pay | Admitting: *Deleted

## 2014-10-23 DIAGNOSIS — I10 Essential (primary) hypertension: Secondary | ICD-10-CM

## 2014-10-23 DIAGNOSIS — Z01812 Encounter for preprocedural laboratory examination: Secondary | ICD-10-CM

## 2014-10-23 DIAGNOSIS — I080 Rheumatic disorders of both mitral and aortic valves: Secondary | ICD-10-CM

## 2014-10-23 NOTE — Telephone Encounter (Signed)
Contacted the pt to inform her that per Dr Meda Coffee her stress test was abnormal and she recommends the pt to be scheduled for a left cardiac cath, and start taking ASA 81 mg po daily.  Noted in the pts chart she is on Coumadin, and self checks this at home and report this to her PCP in [place], [place].  Obtained clarification from Dr Meda Coffee on prescribing Aspirin, and per Dr Meda Coffee the pt should not start taking ASA and should hold her coumadin, if planning to have the cath this Friday 10/26/14.  Also informed the pt that per Dr Meda Coffee she would like to see the pts lipids from her PCP before starting any statin therapy.  Per the pt she states that she is due for a current lipid profile, and would like for this to be drawn by our office, if possible.  Informed the pt that she will need to come in for pre-procedure lab work for tomorrow 10/24/14 at our office, and I can add a lipid profile onto her pre-procedure labs.  Informed the pt she must come fasting to her lab appt for tomorrow 10/24/14.  Informed the pt that we will check a BMET, LIPID PROFILE, CBC W DIFF, AND PT/INR.  Advised the pt to hold her Coumadin starting tonight and until further advised, based off of PT/INR results from tomorrow per Dr Meda Coffee.  Informed the pt that she is scheduled for a left cardiac cath for this Friday 10/26/14 at 1030 with Dr Angelena Form, and must arrive at [facility] by 0830.   Informed the pt that when she comes in for her lab work tomorrow, there will be a letter of instruction for her to pick up in regards to cath time, place, doctor, and any specific instructions needed.  Informed the pt she can ask to speak with me or any triage nurse to go over her cath instruction letter.  Informed the pt that I will send Dr Meda Coffee this message to review about coumadin follow-up and about additional lipid panel added on to her lab appt for tomorrow, and to advise if statin therapy needed.  Pt verbalized  understanding and agrees with this plan. Will also send Dr Meda Coffee a message to place cath lab orders in for Friday 10/26/14 with Dr Angelena Form.

## 2014-10-23 NOTE — Telephone Encounter (Signed)
-----   Message from Dorothy Spark, MD sent at 10/23/2014 12:37 PM EDT ----- She has abnormal stress test and needs to be scheduled for a left cardiac cath. She also needs to be started on aspirin 81 mg po daily. I need to see her lipids from her PCP before I decide about statin therapy. Thank you, K

## 2014-10-24 ENCOUNTER — Other Ambulatory Visit (INDEPENDENT_AMBULATORY_CARE_PROVIDER_SITE_OTHER): Payer: Medicare Other | Admitting: *Deleted

## 2014-10-24 DIAGNOSIS — Z01812 Encounter for preprocedural laboratory examination: Secondary | ICD-10-CM | POA: Diagnosis not present

## 2014-10-24 DIAGNOSIS — I080 Rheumatic disorders of both mitral and aortic valves: Secondary | ICD-10-CM | POA: Diagnosis not present

## 2014-10-24 DIAGNOSIS — I10 Essential (primary) hypertension: Secondary | ICD-10-CM | POA: Diagnosis not present

## 2014-10-24 LAB — CBC WITH DIFFERENTIAL/PLATELET
Basophils Absolute: 0.1 10*3/uL (ref 0.0–0.1)
Basophils Relative: 0.9 % (ref 0.0–3.0)
Eosinophils Absolute: 0.2 10*3/uL (ref 0.0–0.7)
Eosinophils Relative: 3.6 % (ref 0.0–5.0)
HCT: 42.3 % (ref 36.0–46.0)
Hemoglobin: 14.3 g/dL (ref 12.0–15.0)
Lymphocytes Relative: 14 % (ref 12.0–46.0)
Lymphs Abs: 0.9 10*3/uL (ref 0.7–4.0)
MCHC: 33.9 g/dL (ref 30.0–36.0)
MCV: 98.4 fl (ref 78.0–100.0)
Monocytes Absolute: 0.5 10*3/uL (ref 0.1–1.0)
Monocytes Relative: 6.9 % (ref 3.0–12.0)
Neutro Abs: 5 10*3/uL (ref 1.4–7.7)
Neutrophils Relative %: 74.6 % (ref 43.0–77.0)
Platelets: 257 10*3/uL (ref 150.0–400.0)
RBC: 4.3 Mil/uL (ref 3.87–5.11)
RDW: 14.4 % (ref 11.5–15.5)
WBC: 6.7 10*3/uL (ref 4.0–10.5)

## 2014-10-24 LAB — BASIC METABOLIC PANEL
BUN: 24 mg/dL — ABNORMAL HIGH (ref 6–23)
CO2: 27 mEq/L (ref 19–32)
Calcium: 9.4 mg/dL (ref 8.4–10.5)
Chloride: 104 mEq/L (ref 96–112)
Creatinine, Ser: 0.99 mg/dL (ref 0.40–1.20)
GFR: 58.62 mL/min — ABNORMAL LOW (ref >60.00)
Glucose, Bld: 95 mg/dL (ref 70–99)
Potassium: 3.7 mEq/L (ref 3.5–5.1)
Sodium: 140 mEq/L (ref 135–145)

## 2014-10-24 LAB — LIPID PANEL
Cholesterol: 187 mg/dL (ref 0–200)
HDL: 51.2 mg/dL (ref >39.00)
LDL Cholesterol: 111 mg/dL — ABNORMAL HIGH (ref 0–99)
NonHDL: 135.8
Total CHOL/HDL Ratio: 4
Triglycerides: 125 mg/dL (ref 0.0–149.0)
VLDL: 25 mg/dL (ref 0.0–40.0)

## 2014-10-24 LAB — PROTIME-INR
INR: 1.9 ratio — ABNORMAL HIGH (ref 0.8–1.0)
Prothrombin Time: 20.6 s — ABNORMAL HIGH (ref 9.6–13.1)

## 2014-10-24 NOTE — Addendum Note (Signed)
Addended by: Dorothy Spark on: 10/24/2014 10:40 AM   Modules accepted: Orders

## 2014-10-26 ENCOUNTER — Encounter (HOSPITAL_COMMUNITY)
Admission: RE | Disposition: A | Discharge status: Home / Self Care | Payer: Self-pay | Source: Ambulatory Visit | Attending: Cardiovascular Disease

## 2014-10-26 ENCOUNTER — Encounter (HOSPITAL_COMMUNITY): Payer: Self-pay | Admitting: Cardiovascular Disease

## 2014-10-26 ENCOUNTER — Ambulatory Visit (HOSPITAL_COMMUNITY)
Admission: RE | Admit: 2014-10-26 | Discharge: 2014-10-26 | Disposition: A | Discharge status: Home / Self Care | Payer: Medicare Other | Source: Ambulatory Visit | Attending: Cardiovascular Disease | Admitting: Cardiovascular Disease

## 2014-10-26 DIAGNOSIS — G4733 Obstructive sleep apnea (adult) (pediatric): Secondary | ICD-10-CM | POA: Diagnosis not present

## 2014-10-26 DIAGNOSIS — R0789 Other chest pain: Secondary | ICD-10-CM | POA: Diagnosis present

## 2014-10-26 DIAGNOSIS — M159 Polyosteoarthritis, unspecified: Secondary | ICD-10-CM | POA: Insufficient documentation

## 2014-10-26 DIAGNOSIS — Z6837 Body mass index (BMI) 37.0-37.9, adult: Secondary | ICD-10-CM | POA: Diagnosis not present

## 2014-10-26 DIAGNOSIS — Z86711 Personal history of pulmonary embolism: Secondary | ICD-10-CM | POA: Diagnosis not present

## 2014-10-26 DIAGNOSIS — K219 Gastro-esophageal reflux disease without esophagitis: Secondary | ICD-10-CM | POA: Insufficient documentation

## 2014-10-26 DIAGNOSIS — M109 Gout, unspecified: Secondary | ICD-10-CM | POA: Diagnosis not present

## 2014-10-26 DIAGNOSIS — Z853 Personal history of malignant neoplasm of breast: Secondary | ICD-10-CM | POA: Diagnosis not present

## 2014-10-26 DIAGNOSIS — I200 Unstable angina: Secondary | ICD-10-CM | POA: Diagnosis not present

## 2014-10-26 DIAGNOSIS — Z90710 Acquired absence of both cervix and uterus: Secondary | ICD-10-CM | POA: Diagnosis not present

## 2014-10-26 DIAGNOSIS — R9439 Abnormal result of other cardiovascular function study: Secondary | ICD-10-CM

## 2014-10-26 DIAGNOSIS — J45909 Unspecified asthma, uncomplicated: Secondary | ICD-10-CM | POA: Insufficient documentation

## 2014-10-26 DIAGNOSIS — E785 Hyperlipidemia, unspecified: Secondary | ICD-10-CM | POA: Insufficient documentation

## 2014-10-26 DIAGNOSIS — I2510 Atherosclerotic heart disease of native coronary artery without angina pectoris: Secondary | ICD-10-CM | POA: Diagnosis not present

## 2014-10-26 DIAGNOSIS — M549 Dorsalgia, unspecified: Secondary | ICD-10-CM | POA: Insufficient documentation

## 2014-10-26 DIAGNOSIS — G8929 Other chronic pain: Secondary | ICD-10-CM | POA: Insufficient documentation

## 2014-10-26 DIAGNOSIS — Z9012 Acquired absence of left breast and nipple: Secondary | ICD-10-CM | POA: Diagnosis not present

## 2014-10-26 DIAGNOSIS — Z96653 Presence of artificial knee joint, bilateral: Secondary | ICD-10-CM | POA: Diagnosis not present

## 2014-10-26 DIAGNOSIS — Z87891 Personal history of nicotine dependence: Secondary | ICD-10-CM | POA: Diagnosis not present

## 2014-10-26 DIAGNOSIS — I10 Essential (primary) hypertension: Secondary | ICD-10-CM | POA: Insufficient documentation

## 2014-10-26 DIAGNOSIS — K449 Diaphragmatic hernia without obstruction or gangrene: Secondary | ICD-10-CM | POA: Diagnosis not present

## 2014-10-26 DIAGNOSIS — Z7901 Long term (current) use of anticoagulants: Secondary | ICD-10-CM | POA: Insufficient documentation

## 2014-10-26 DIAGNOSIS — Z79899 Other long term (current) drug therapy: Secondary | ICD-10-CM | POA: Insufficient documentation

## 2014-10-26 DIAGNOSIS — F419 Anxiety disorder, unspecified: Secondary | ICD-10-CM | POA: Diagnosis not present

## 2014-10-26 DIAGNOSIS — E669 Obesity, unspecified: Secondary | ICD-10-CM | POA: Diagnosis not present

## 2014-10-26 DIAGNOSIS — R0609 Other forms of dyspnea: Secondary | ICD-10-CM

## 2014-10-26 DIAGNOSIS — M797 Fibromyalgia: Secondary | ICD-10-CM | POA: Insufficient documentation

## 2014-10-26 DIAGNOSIS — R011 Cardiac murmur, unspecified: Secondary | ICD-10-CM

## 2014-10-26 HISTORY — PX: LEFT HEART CATHETERIZATION WITH CORONARY ANGIOGRAM: SHX5451

## 2014-10-26 LAB — PROTIME-INR
INR: 1.41 (ref 0.00–1.49)
Prothrombin Time: 17.4 seconds — ABNORMAL HIGH (ref 11.6–15.2)

## 2014-10-26 SURGERY — LEFT HEART CATHETERIZATION WITH CORONARY ANGIOGRAM
Anesthesia: LOCAL

## 2014-10-26 MED ORDER — ASPIRIN 81 MG PO CHEW
CHEWABLE_TABLET | ORAL | Status: AC
  Filled 2014-10-26: qty 1

## 2014-10-26 MED ORDER — LIDOCAINE HCL (PF) 1 % IJ SOLN
INTRAMUSCULAR | Status: AC
  Filled 2014-10-26: qty 30

## 2014-10-26 MED ORDER — SODIUM CHLORIDE 0.9 % IV SOLN: INTRAVENOUS | Status: AC

## 2014-10-26 MED ORDER — HEPARIN (PORCINE) IN NACL 2-0.9 UNIT/ML-% IJ SOLN
INTRAMUSCULAR | Status: AC
  Filled 2014-10-26: qty 500

## 2014-10-26 MED ORDER — HEPARIN (PORCINE) IN NACL 2-0.9 UNIT/ML-% IJ SOLN
INTRAMUSCULAR | Status: AC
  Filled 2014-10-26: qty 1000

## 2014-10-26 MED ORDER — SODIUM CHLORIDE 0.9 % IV SOLN
INTRAVENOUS | Status: DC
  Administered 2014-10-26: 11:00:00 via INTRAVENOUS

## 2014-10-26 MED ORDER — FENTANYL CITRATE 0.05 MG/ML IJ SOLN
INTRAMUSCULAR | Status: AC
  Filled 2014-10-26: qty 2

## 2014-10-26 MED ORDER — SODIUM CHLORIDE 0.9 % IJ SOLN: 3.0000 mL | INTRAMUSCULAR | Status: DC | PRN

## 2014-10-26 MED ORDER — ASPIRIN 81 MG PO CHEW
81.0000 mg | CHEWABLE_TABLET | ORAL | Status: AC
  Administered 2014-10-26: 81 mg via ORAL

## 2014-10-26 MED ORDER — HEPARIN SODIUM (PORCINE) 1000 UNIT/ML IJ SOLN
INTRAMUSCULAR | Status: AC
  Filled 2014-10-26: qty 1

## 2014-10-26 MED ORDER — MIDAZOLAM HCL 2 MG/2ML IJ SOLN
INTRAMUSCULAR | Status: AC
  Filled 2014-10-26: qty 2

## 2014-10-26 MED ORDER — SODIUM CHLORIDE 0.9 % IV SOLN: 250.0000 mL | INTRAVENOUS | Status: DC | PRN

## 2014-10-26 MED ORDER — SODIUM CHLORIDE 0.9 % IJ SOLN: 3.0000 mL | Freq: Two times a day (BID) | INTRAMUSCULAR | Status: DC

## 2014-10-26 NOTE — Discharge Instructions (Signed)
Resume coumadin tonight. Follow up coumadin clinic Tuesday  Radial Site Care Refer to this sheet in the next few weeks. These instructions provide you with information on caring for yourself after your procedure. Your caregiver may also give you more specific instructions. Your treatment has been planned according to current medical practices, but problems sometimes occur. Call your caregiver if you have any problems or questions after your procedure. HOME CARE INSTRUCTIONS  You may shower the day after the procedure.Remove the bandage (dressing) and gently wash the site with plain soap and water.Gently pat the site dry.  Do not apply powder or lotion to the site.  Do not submerge the affected site in water for 3 to 5 days.  Inspect the site at least twice daily.  Do not flex or bend the affected arm for 24 hours.  No lifting over 5 pounds (2.3 kg) for 5 days after your procedure.  Do not drive home if you are discharged the same day of the procedure. Have someone else drive you.  You may drive 24 hours after the procedure unless otherwise instructed by your caregiver.  Do not operate machinery or power tools for 24 hours.  A responsible adult should be with you for the first 24 hours after you arrive home. What to expect:  Any bruising will usually fade within 1 to 2 weeks.  Blood that collects in the tissue (hematoma) may be painful to the touch. It should usually decrease in size and tenderness within 1 to 2 weeks. SEEK IMMEDIATE MEDICAL CARE IF:  You have unusual pain at the radial site.  You have redness, warmth, swelling, or pain at the radial site.  You have drainage (other than a small amount of blood on the dressing).  You have chills.  You have a fever or persistent symptoms for more than 72 hours.  You have a fever and your symptoms suddenly get worse.  Your arm becomes pale, cool, tingly, or numb.  You have heavy bleeding from the site. Hold pressure on the  site and call 911. Document Released: 08/08/2010 Document Revised: 09/28/2011 Document Reviewed: 08/08/2010 [facility] Patient Information 2015 [place], [place]. This information is not intended to replace advice given to you by your health care provider. Make sure you discuss any questions you have with your health care provider.

## 2014-10-26 NOTE — H&P (View-Only) (Signed)
Patient ID: Lydia Campbell, female   DOB: August 14, 1940, 74 y.o.   MRN: [id number]      Cardiology Office Note   Date:  10/11/2014   ID:  Lydia Campbell, DOB 1940-09-16, MRN [id number]  PCP:  Lydia Manila, MD  Cardiologist:  Dorothy Spark, MD   Chief complain: chest pain, DOE    History of Present Illness: Lydia Campbell is a 74 y.o. female with prior medical h/o obesity, hypertension, breast cancer post chemotherapy, pulmonary embolism in 2012 on chronic therapy with coumadin who presented with chest pain. She states that she likes to walk and feels ok while doing it but afterwards or a day after she will feel significant chest pain across her chest. No associated SOB or palpitations. No orthopnea or PND. No syncope. The pain is pressure like. Overall she has noticed DOE with moderate activity.  She has significant h/o premature CAD, father had MI in 54' and CVA. Brother MI at 80, also CVA. She quit smoking in 1970'..    Past Medical History  Diagnosis Date  . GERD (gastroesophageal reflux disease)   . Asthma   . Blood transfusion 07/2008    "related to chemo" (07/25/2012)  . Clotting disorder     "sudden chest pain; found multiple blood clots in both lungs; they think it was from Tamoxifen" (07/25/2012)  . Heart murmur   . Hypertension   . H/O hiatal hernia   . Fibromyalgia   . Anxiety   . Subcutaneous hematoma 07/25/2012    Intraoperative cultures are pending on this fluid collection that was just outside the left knee joint.  Gram stain had few WBC monos and polys.  . Pulmonary embolism 02/2011  . Exertional dyspnea   . OSA on CPAP   . Hx of blood clots 02/2011    "sudden chest pain; found multiple blood clots in both lungs; they think it was from Tamoxifen; haven't had any since 11/2011" (07/25/2012)  . Breast cancer     breast lft  . Seizures     "seizures vs convulsions related to PCN when I was a child" (07/25/2012)  . Osteoarthritis     "pretty much everywhere"  (07/25/2012)  . Right knee DJD   . Left knee DJD   . Chronic lower back pain   . Gout     Past Surgical History  Procedure Laterality Date  . Knee arthroscopy  2001;  2009    "right; left" (07/25/2012)  . Total knee arthroplasty  12/21/2011    Procedure: TOTAL KNEE ARTHROPLASTY;  Surgeon: Lorn Junes, MD;  Location: [facility];  Service: Orthopedics;  Laterality: Right;  DR [facility] THIS CASE  . Tonsillectomy  1950  . Vaginal hysterectomy  ~ 1983    "endometrosis" (07/25/2012)  . Tubal ligation  ~ 1976  . Mastectomy modified radical / simple / complete  03/28/2008    "left" (07/25/2012)  . Breast surgery    . Breast lumpectomy  02/2008    "left" (07/25/2012)  . Portacath placement  03/28/2008; ?05/2008    "left; right" (07/25/2012)  . Total knee arthroplasty with revision components  07/25/2012    Procedure: TOTAL KNEE ARTHROPLASTY WITH REVISION COMPONENTS;  Surgeon: Lorn Junes, MD;  Location: [facility];  Service: Orthopedics;  Laterality: Left;     Current Outpatient Prescriptions  Medication Sig Dispense Refill  . acetaminophen (TYLENOL) 325 MG tablet Take 2 tablets (650 mg total) by mouth every 6 (six)  hours as needed for fever (or Fever >/= 101).    . ADVAIR DISKUS 250-50 MCG/DOSE AEPB As directed    . allopurinol (ZYLOPRIM) 300 MG tablet Take 300 mg by mouth daily.     . Ascorbic Acid (VITAMIN C) 1000 MG tablet Take 1,000 mg by mouth daily.     . B Complex-Biotin-FA (B-COMPLEX PO) Take 100 mg by mouth at bedtime.     . Biotin 5000 MCG CAPS Take 1 capsule by mouth daily.     . calcium carbonate (CALCIUM 500) 1250 MG tablet Take 1 tablet by mouth daily.    . cetirizine (ZYRTEC) 10 MG tablet Take 10 mg by mouth daily.     . cholecalciferol (VITAMIN D) 400 UNITS TABS Take 400 Units by mouth daily.     . hydroxypropyl methylcellulose (ISOPTO TEARS) 2.5 % ophthalmic solution Place 1 drop into both eyes 4 (four) times daily as needed. Dry eyes    . MAGNESIUM PO Take 50 mg by  mouth 3 (three) times daily.     . nabumetone (RELAFEN) 500 MG tablet     . NON FORMULARY Patient uses a C PAP machine    . omeprazole (PRILOSEC) 20 MG capsule Take 20 mg by mouth daily.     . pantoprazole (PROTONIX) 40 MG tablet Take 40 mg by mouth daily.     . pimecrolimus (ELIDEL) 1 % cream Apply 1 application topically daily.    . polyethylene glycol (MIRALAX / GLYCOLAX) packet Take 17 g by mouth daily.    . Potassium Chloride Crys CR (KLOR-CON M20 PO) Take 1 tablet by mouth 2 (two) times daily.     . traZODone (DESYREL) 50 MG tablet Take 50 mg by mouth at bedtime.     . triamterene-hydrochlorothiazide (MAXZIDE) 75-50 MG per tablet Take 1 tablet by mouth daily.     Marland Kitchen UNABLE TO FIND Rx: L8000- Post Surgical Bras (Quantity: 6) Dx: 174.9; Left Mastectomy 1 each 0  . warfarin (COUMADIN) 5 MG tablet Take 2.5-5 mg by mouth daily. Alternates every other day. Warfarin 5mg  due Monday 07/11/12 then 2.5mg  due Tuesday 07/12/12     No current facility-administered medications for this visit.    Allergies:   Morphine and related and Penicillins    Social History:  The patient  reports that she quit smoking about 45 years ago. Her smoking use included Cigarettes. She has a 7.5 pack-year smoking history. She has never used smokeless tobacco. She reports that she drinks alcohol. She reports that she does not use illicit drugs.   Family History:  The patient's family history includes CVA in her father; Diabetes in her father; Heart disease in her brother, father, and mother.    ROS:  Please see the history of present illness.  All other systems are reviewed and negative.    PHYSICAL EXAM: VS:  BP 118/74 mmHg  Pulse 83  Ht 5' 7.5" (1.715 m)  Wt 240 lb (108.863 kg)  BMI 37.01 kg/m2 , BMI Body mass index is 37.01 kg/(m^2). GEN: Well nourished, well developed, in no acute distress HEENT: normal Neck: no JVD, carotid bruits, or masses Cardiac: RRR; 2/6 systolic murmur, no rubs, or gallops,no edema    Respiratory:  clear to auscultation bilaterally, normal work of breathing GI: soft, nontender, nondistended, + BS MS: no deformity or atrophy Skin: warm and dry, no rash Neuro:  Strength and sensation are intact Psych: euthymic mood, full affect   EKG:  EKG is ordered today. The  ekg ordered today demonstrates SR, low\ voltage, unchanged from 12/24/2011.  03/05/2012 Left ventricle: The cavity size was normal. Systolic function was  normal. The estimated ejection fraction was in the range of 60%  to 65%. Wall motion was normal; there were no regional wall  motion abnormalities. 2. Mitral valve: Mild regurgitation.   Recent Labs: No results found for requested labs within last 365 days.    Lipid Panel No results found for: CHOL, TRIG, HDL, CHOLHDL, VLDL, LDLCALC, LDLDIRECT    Wt Readings from Last 3 Encounters:  10/11/14 240 lb (108.863 kg)  05/11/13 232 lb (105.235 kg)  10/13/12 223 lb 6.4 oz (101.334 kg)     ASSESSMENT AND PLAN:  1. Chest pain - rather atypical, however postmenopausal female with significant risk factors including obesity, hyperlipidemia, FH of CAD. - we will order exercise nuclear stress test  2. DOE -  - h/o chemotherapy - order echocardiogram   3. Systolic murmur - obtain echocardiogram  4. Lipids - we will obtain labs from PCP  Follow up in 2 years if all tests are negative.    Signed, Dorothy Spark, MD  10/11/2014 2:23 PM    [facility] [address] [place], [place], [place]  [address] Phone: [phone]; Fax: [phone]

## 2014-10-26 NOTE — Interval H&P Note (Signed)
History and Physical Interval Note:  10/26/2014 11:03 AM  Lydia Campbell  has presented today for cardiac cath with the diagnosis of unstable angina, intermediate risk stress test  The various methods of treatment have been discussed with the patient and family. After consideration of risks, benefits and other options for treatment, the patient has consented to  Procedure(s): LEFT HEART CATHETERIZATION WITH CORONARY ANGIOGRAM (N/A) as a surgical intervention .  The patient's history has been reviewed, patient examined, no change in status, stable for surgery.  I have reviewed the patient's chart and labs.  Questions were answered to the patient's satisfaction.    Cath Lab Visit (complete for each Cath Lab visit)  Clinical Evaluation Leading to the Procedure:   ACS: No.  Non-ACS:    Anginal Classification: CCS III  Anti-ischemic medical therapy: No Therapy  Non-Invasive Test Results: Intermediate-risk stress test findings: cardiac mortality 1-3%/year  Prior CABG: No previous CABG         MCALHANY,CHRISTOPHER

## 2014-10-26 NOTE — CV Procedure (Signed)
      Cardiac Catheterization Operative Report  Lydia Campbell [id number] 4/8/201611:46 AM Galen Manila, MD  Procedure Performed:  1. Left Heart Catheterization 2. Selective Coronary Angiography 3. Left ventricular angiogram  Operator: Lauree Chandler, MD  Arterial access site:  Right radial artery.   Indication: 74 yo female with recent chest pain concerning for unstable angina. Intermediate risk stress test.                                      Procedure Details: The risks, benefits, complications, treatment options, and expected outcomes were discussed with the patient. The patient and/or family concurred with the proposed plan, giving informed consent. The patient was brought to the cath lab after IV hydration was begun and oral premedication was given. The patient was further sedated with Versed and Fentanyl. The right wrist was assessed with a modified Allens test which was positive. The right wrist was prepped and draped in a sterile fashion. 1% lidocaine was used for local anesthesia. Using the modified Seldinger access technique, a 5 French sheath was placed in the right radial artery. 3 mg Verapamil was given through the sheath. 5000 units IV heparin was given. Standard diagnostic catheters were used to perform selective coronary angiography. A pigtail catheter was used to perform a left ventricular angiogram. The sheath was removed from the right radial artery and a Terumo hemostasis band was applied at the arteriotomy site on the right wrist.    There were no immediate complications. The patient was taken to the recovery area in stable condition.   Hemodynamic Findings: Central aortic pressure: 113/57 Left ventricular pressure: 05/25/12  Angiographic Findings:  Left main: No obstructive disease.   Left Anterior Descending Artery: Large caliber vessel that courses to the apex. Moderate caliber diagonal branch. No obstructive disease.   Circumflex Artery:  Large caliber vessel with large obtuse marginal branch. The proximal vessel has mild plaque. The obtuse marginal branch has a focal 20% stenosis.   Right Coronary Artery: Large dominant vessel with minor luminal irregularities in the mid vessel.    Left Ventricular Angiogram: LVEF=60%  Impression: 1. Mild non-obstructive CAD 2. Normal LV systolic function 3. Non-cardiac chest pain  Recommendations: Medical management of mild CAD.        Complications:  None. The patient tolerated the procedure well.

## 2014-11-09 ENCOUNTER — Ambulatory Visit: Payer: Medicare Other

## 2014-11-23 ENCOUNTER — Ambulatory Visit
Admission: RE | Admit: 2014-11-23 | Discharge: 2014-11-23 | Disposition: A | Discharge status: Home / Self Care | Payer: Medicare Other | Source: Ambulatory Visit

## 2014-11-23 ENCOUNTER — Other Ambulatory Visit: Payer: Self-pay

## 2014-11-23 DIAGNOSIS — Z1231 Encounter for screening mammogram for malignant neoplasm of breast: Secondary | ICD-10-CM

## 2015-10-31 ENCOUNTER — Other Ambulatory Visit: Payer: Self-pay

## 2015-10-31 DIAGNOSIS — Z1231 Encounter for screening mammogram for malignant neoplasm of breast: Secondary | ICD-10-CM

## 2015-11-27 ENCOUNTER — Ambulatory Visit
Admission: RE | Admit: 2015-11-27 | Discharge: 2015-11-27 | Disposition: A | Discharge status: Home / Self Care | Payer: Medicare Other | Source: Ambulatory Visit

## 2015-11-27 DIAGNOSIS — Z1231 Encounter for screening mammogram for malignant neoplasm of breast: Secondary | ICD-10-CM

## 2016-03-24 ENCOUNTER — Encounter: Payer: Self-pay | Admitting: Gastroenterology

## 2016-04-30 ENCOUNTER — Ambulatory Visit (INDEPENDENT_AMBULATORY_CARE_PROVIDER_SITE_OTHER): Payer: Medicare Other | Admitting: Gastroenterology

## 2016-04-30 ENCOUNTER — Other Ambulatory Visit (INDEPENDENT_AMBULATORY_CARE_PROVIDER_SITE_OTHER): Payer: Medicare Other

## 2016-04-30 ENCOUNTER — Encounter: Payer: Self-pay | Admitting: Gastroenterology

## 2016-04-30 VITALS — BP 100/58 | HR 64 | Ht 67.0 in | Wt 229.0 lb

## 2016-04-30 DIAGNOSIS — R197 Diarrhea, unspecified: Secondary | ICD-10-CM

## 2016-04-30 LAB — IGA: IgA: 195 mg/dL (ref 68–378)

## 2016-04-30 MED ORDER — DICYCLOMINE HCL 10 MG PO CAPS: 10.0000 mg | ORAL_CAPSULE | Freq: Three times a day (TID) | ORAL | 5 refills | Status: DC

## 2016-04-30 NOTE — Progress Notes (Signed)
    History of Present Illness: This is a 75 year old female self referred for the evaluation of diarrhea and constipation. These symptoms have been present for many years. She has been followed by Dr. Laurence Spates and was treated with MiraLAX for constipation problems. She reports that she underwent colonoscopy by Dr. Oletta Lamas in 2015 showing only diverticulosis. Patient frequently notes bloating and incomplete stool evacuation. She notes raw fruits and vegetables exacerbate her diarrhea so she generally avoids these foods. She has not noted any other foods that exacerbate her symptoms. She states she typically has loose stools following bowel movements without bleeding and occasionally she will have constipation. She notes occasional small amounts of fecal incontinence. Blood work performed on September 29 by her PCP showed a normal CBC, normal C met, normal TSH. Denies weight loss, change in stool caliber, melena, hematochezia, nausea, vomiting, dysphagia, reflux symptoms, chest pain.  Review of Systems: Pertinent positive and negative review of systems were noted in the above HPI section. All other review of systems were otherwise negative.  Current Medications, Allergies, Past Medical History, Past Surgical History, Family History and Social History were reviewed in [employer] record.  Physical Exam: General: Well developed, well nourished, no acute distress Head: Normocephalic and atraumatic Eyes:  sclerae anicteric, EOMI Ears: Normal auditory acuity Mouth: No deformity or lesions Neck: Supple, no masses or thyromegaly Lungs: Clear throughout to auscultation Heart: Regular rate and rhythm; no murmurs, rubs or bruits Abdomen: Soft, non tender and non distended. No masses, hepatosplenomegaly or hernias noted. Normal Bowel sounds Musculoskeletal: Symmetrical with no gross deformities  Skin: No lesions on visible extremities Pulses:  Normal pulses noted Extremities: No  clubbing, cyanosis, edema or deformities noted Neurological: Alert oriented x 4, grossly nonfocal Cervical Nodes:  No significant cervical adenopathy Inguinal Nodes: No significant inguinal adenopathy Psychological:  Alert and cooperative. Normal mood and affect  Assessment and Recommendations:  1. Diarrhea with occasional constipation, frequent bloating and occasional incontinence. IgA, tTG. Lactose free diet for 7 days, if diarrhea not resolved then hold Mg and if diarrhea not resolved a low FODMAP diet. If above measures not helpful in controlling diarrhea will send stool for GI pathogen panel and if negative plan for a course of antibiotics for possible SIBO. Bentyl 10 mg qid prn. Recommended scheduling anorectal manometry for further evaluation of her incontinence. She states she would like to trial the above measures first to see if her diarrhea improves and if not she will reconsider. Request records from her PCP and Dr. Oletta Lamas. REV in 4-6 weeks.

## 2016-04-30 NOTE — Patient Instructions (Signed)
Your physician has requested that you go to the basement for the following lab work before leaving today:TTG, IGA.  We have sent the following medications to your pharmacy for you to pick up at your convenience:Bentyl.  Please start a lactose free diet x 7 days. If you so no improvement in your symptoms then try stopping your magnesium.   If you still do not see improvement in your symptoms then start the low Fodmap diet.   Thank you for choosing me and [facility].  Pricilla Riffle. Dagoberto Ligas., MD., Marval Regal

## 2016-05-01 LAB — TISSUE TRANSGLUTAMINASE, IGA: Tissue Transglutaminase Ab, IgA: 1 U/mL (ref <4)

## 2016-06-05 ENCOUNTER — Encounter: Payer: Self-pay | Admitting: Gastroenterology

## 2016-06-05 ENCOUNTER — Ambulatory Visit (INDEPENDENT_AMBULATORY_CARE_PROVIDER_SITE_OTHER): Payer: Medicare Other | Admitting: Gastroenterology

## 2016-06-05 VITALS — BP 116/60 | HR 68 | Ht <= 58 in | Wt 227.5 lb

## 2016-06-05 DIAGNOSIS — K5900 Constipation, unspecified: Secondary | ICD-10-CM

## 2016-06-05 DIAGNOSIS — Z8371 Family history of colonic polyps: Secondary | ICD-10-CM | POA: Diagnosis not present

## 2016-06-05 NOTE — Progress Notes (Signed)
    History of Present Illness: This is a 75 year old female returning for follow-up of diarrhea and constipation. Bloating and incomplete stool evacuation symptoms have improved. Since her last visit she has not had any episodes of fecal incontinence. All symptoms have improved since avoiding lactose, raw fruits, raw vegatable and wheat. She notes that since her husband recovered from surgery several months ago her symptoms have improved leading her to believe that some of her symptoms were stress related.  Current Medications, Allergies, Past Medical History, Past Surgical History, Family History and Social History were reviewed in [employer] record.  Physical Exam: General: Well developed, well nourished, no acute distress Head: Normocephalic and atraumatic Eyes:  sclerae anicteric, EOMI Ears: Normal auditory acuity Mouth: No deformity or lesions Lungs: Clear throughout to auscultation Heart: Regular rate and rhythm; no murmurs, rubs or bruits Abdomen: Soft, non tender and non distended. No masses, hepatosplenomegaly or hernias noted. Normal Bowel sounds Musculoskeletal: Symmetrical with no gross deformities  Pulses:  Normal pulses noted Extremities: No clubbing, cyanosis, edema or deformities noted Neurological: Alert oriented x 4, grossly nonfocal Psychological:  Alert and cooperative. Normal mood and affect  Assessment and Recommendation:  1.  Diarrhea and constipation. High fiber diet with adequate daily water intake. MiraLAX once or twice daily as needed for constipation. Avoid foods that trigger diarrhea.  2. Family history of colon polyps. Five-year interval screening colonoscopy is recommended in October 2020.  I spent 15 minutes of face-to-face time with the patient. Greater than 50% of the time was spent counseling and coordinating care.

## 2016-06-05 NOTE — Patient Instructions (Signed)
You will be due for a recall colonoscopy in 04/20/2019. We will send you a reminder in the mail when it gets closer to that time.  Normal BMI (Body Mass Index- based on height and weight) is between 23 and 30. Your BMI today is Body mass index is 47.55 kg/m. Marland Kitchen Please consider follow up  regarding your BMI with your Primary Care Provider.  Thank you for choosing me and [facility].  Pricilla Riffle. Dagoberto Ligas., MD., Marval Regal

## 2016-10-15 ENCOUNTER — Other Ambulatory Visit: Payer: Self-pay | Admitting: General Surgery

## 2016-10-15 DIAGNOSIS — Z1231 Encounter for screening mammogram for malignant neoplasm of breast: Secondary | ICD-10-CM

## 2016-11-10 ENCOUNTER — Observation Stay (HOSPITAL_COMMUNITY)
Admission: EM | Admit: 2016-11-10 | Discharge: 2016-11-11 | Disposition: A | Discharge status: Home / Self Care | Payer: Medicare Other | Attending: Cardiology | Admitting: Cardiology

## 2016-11-10 ENCOUNTER — Telehealth: Payer: Self-pay | Admitting: Cardiology

## 2016-11-10 ENCOUNTER — Emergency Department (HOSPITAL_COMMUNITY): Payer: Medicare Other

## 2016-11-10 ENCOUNTER — Encounter (HOSPITAL_COMMUNITY): Payer: Self-pay

## 2016-11-10 DIAGNOSIS — E785 Hyperlipidemia, unspecified: Secondary | ICD-10-CM | POA: Diagnosis not present

## 2016-11-10 DIAGNOSIS — Z96653 Presence of artificial knee joint, bilateral: Secondary | ICD-10-CM | POA: Diagnosis not present

## 2016-11-10 DIAGNOSIS — Z885 Allergy status to narcotic agent status: Secondary | ICD-10-CM | POA: Insufficient documentation

## 2016-11-10 DIAGNOSIS — Z7952 Long term (current) use of systemic steroids: Secondary | ICD-10-CM | POA: Insufficient documentation

## 2016-11-10 DIAGNOSIS — Z79899 Other long term (current) drug therapy: Secondary | ICD-10-CM | POA: Diagnosis not present

## 2016-11-10 DIAGNOSIS — Z91048 Other nonmedicinal substance allergy status: Secondary | ICD-10-CM | POA: Insufficient documentation

## 2016-11-10 DIAGNOSIS — R42 Dizziness and giddiness: Secondary | ICD-10-CM | POA: Diagnosis not present

## 2016-11-10 DIAGNOSIS — R079 Chest pain, unspecified: Secondary | ICD-10-CM | POA: Diagnosis present

## 2016-11-10 DIAGNOSIS — Z8249 Family history of ischemic heart disease and other diseases of the circulatory system: Secondary | ICD-10-CM | POA: Diagnosis not present

## 2016-11-10 DIAGNOSIS — Z87891 Personal history of nicotine dependence: Secondary | ICD-10-CM | POA: Insufficient documentation

## 2016-11-10 DIAGNOSIS — I25119 Atherosclerotic heart disease of native coronary artery with unspecified angina pectoris: Secondary | ICD-10-CM | POA: Diagnosis not present

## 2016-11-10 DIAGNOSIS — E7800 Pure hypercholesterolemia, unspecified: Secondary | ICD-10-CM

## 2016-11-10 DIAGNOSIS — K219 Gastro-esophageal reflux disease without esophagitis: Secondary | ICD-10-CM | POA: Insufficient documentation

## 2016-11-10 DIAGNOSIS — I10 Essential (primary) hypertension: Secondary | ICD-10-CM | POA: Diagnosis not present

## 2016-11-10 DIAGNOSIS — Z7901 Long term (current) use of anticoagulants: Secondary | ICD-10-CM | POA: Diagnosis not present

## 2016-11-10 DIAGNOSIS — R0602 Shortness of breath: Secondary | ICD-10-CM | POA: Diagnosis not present

## 2016-11-10 DIAGNOSIS — R05 Cough: Secondary | ICD-10-CM | POA: Diagnosis not present

## 2016-11-10 DIAGNOSIS — Z86711 Personal history of pulmonary embolism: Secondary | ICD-10-CM | POA: Diagnosis not present

## 2016-11-10 DIAGNOSIS — F419 Anxiety disorder, unspecified: Secondary | ICD-10-CM | POA: Diagnosis not present

## 2016-11-10 DIAGNOSIS — I2510 Atherosclerotic heart disease of native coronary artery without angina pectoris: Secondary | ICD-10-CM | POA: Insufficient documentation

## 2016-11-10 DIAGNOSIS — Z880 Allergy status to penicillin: Secondary | ICD-10-CM | POA: Insufficient documentation

## 2016-11-10 DIAGNOSIS — M797 Fibromyalgia: Secondary | ICD-10-CM | POA: Insufficient documentation

## 2016-11-10 DIAGNOSIS — Z853 Personal history of malignant neoplasm of breast: Secondary | ICD-10-CM | POA: Insufficient documentation

## 2016-11-10 LAB — CBC
HEMATOCRIT: 41.1 % (ref 36.0–46.0)
HEMOGLOBIN: 13.9 g/dL (ref 12.0–15.0)
MCH: 32.8 pg (ref 26.0–34.0)
MCHC: 33.8 g/dL (ref 30.0–36.0)
MCV: 96.9 fL (ref 78.0–100.0)
Platelets: 239 10*3/uL (ref 150–400)
RBC: 4.24 MIL/uL (ref 3.87–5.11)
RDW: 13.8 % (ref 11.5–15.5)
WBC: 8.6 10*3/uL (ref 4.0–10.5)

## 2016-11-10 LAB — BASIC METABOLIC PANEL
ANION GAP: 11 (ref 5–15)
BUN: 24 mg/dL — AB (ref 6–20)
CALCIUM: 9.4 mg/dL (ref 8.9–10.3)
CO2: 22 mmol/L (ref 22–32)
Chloride: 106 mmol/L (ref 101–111)
Creatinine, Ser: 0.82 mg/dL (ref 0.44–1.00)
GFR calc Af Amer: >60 mL/min (ref >60)
GLUCOSE: 91 mg/dL (ref 65–99)
POTASSIUM: 3.4 mmol/L — AB (ref 3.5–5.1)
Sodium: 139 mmol/L (ref 135–145)

## 2016-11-10 LAB — PROTIME-INR
INR: 1.92
Prothrombin Time: 22.2 seconds — ABNORMAL HIGH (ref 11.4–15.2)

## 2016-11-10 LAB — BRAIN NATRIURETIC PEPTIDE: B Natriuretic Peptide: 63.9 pg/mL (ref 0.0–100.0)

## 2016-11-10 LAB — TROPONIN I: Troponin I: <0.03 ng/mL (ref <0.03)

## 2016-11-10 LAB — I-STAT TROPONIN, ED
TROPONIN I, POC: 0 ng/mL (ref 0.00–0.08)
Troponin i, poc: 0 ng/mL (ref 0.00–0.08)

## 2016-11-10 LAB — CREATININE, SERUM
Creatinine, Ser: 0.75 mg/dL (ref 0.44–1.00)
GFR calc Af Amer: >60 mL/min (ref >60)
GFR calc non Af Amer: >60 mL/min (ref >60)

## 2016-11-10 MED ORDER — B COMPLEX-C PO TABS
1.0000 | ORAL_TABLET | Freq: Every day | ORAL | Status: DC
  Administered 2016-11-10: 1 via ORAL
  Filled 2016-11-10: qty 1

## 2016-11-10 MED ORDER — CHOLECALCIFEROL 10 MCG (400 UNIT) PO TABS
400.0000 [IU] | ORAL_TABLET | Freq: Every day | ORAL | Status: DC
  Administered 2016-11-11: 400 [IU] via ORAL
  Filled 2016-11-10: qty 1

## 2016-11-10 MED ORDER — ZOLPIDEM TARTRATE 5 MG PO TABS: 5.0000 mg | ORAL_TABLET | Freq: Every evening | ORAL | Status: DC | PRN

## 2016-11-10 MED ORDER — TRIAMTERENE-HCTZ 75-50 MG PO TABS
1.0000 | ORAL_TABLET | Freq: Two times a day (BID) | ORAL | Status: DC
  Administered 2016-11-10 – 2016-11-11 (×2): 1 via ORAL
  Filled 2016-11-10 (×3): qty 1

## 2016-11-10 MED ORDER — NABUMETONE 500 MG PO TABS
500.0000 mg | ORAL_TABLET | Freq: Two times a day (BID) | ORAL | Status: DC | PRN
  Filled 2016-11-10: qty 1

## 2016-11-10 MED ORDER — WARFARIN - PHARMACIST DOSING INPATIENT: Freq: Every day | Status: DC

## 2016-11-10 MED ORDER — ENOXAPARIN SODIUM 40 MG/0.4ML ~~LOC~~ SOLN
40.0000 mg | Freq: Every day | SUBCUTANEOUS | Status: DC
  Administered 2016-11-10: 40 mg via SUBCUTANEOUS
  Filled 2016-11-10: qty 0.4

## 2016-11-10 MED ORDER — CALCIUM CARBONATE 1250 (500 CA) MG PO TABS
1.0000 | ORAL_TABLET | Freq: Every day | ORAL | Status: DC
  Administered 2016-11-11: 500 mg via ORAL
  Filled 2016-11-10: qty 1

## 2016-11-10 MED ORDER — ONDANSETRON HCL 4 MG/2ML IJ SOLN: 4.0000 mg | Freq: Four times a day (QID) | INTRAMUSCULAR | Status: DC | PRN

## 2016-11-10 MED ORDER — NITROGLYCERIN 0.4 MG SL SUBL: 0.4000 mg | SUBLINGUAL_TABLET | SUBLINGUAL | Status: DC | PRN

## 2016-11-10 MED ORDER — HYPROMELLOSE (GONIOSCOPIC) 2.5 % OP SOLN: 1.0000 [drp] | Freq: Four times a day (QID) | OPHTHALMIC | Status: DC | PRN

## 2016-11-10 MED ORDER — ASPIRIN 81 MG PO CHEW
324.0000 mg | CHEWABLE_TABLET | Freq: Once | ORAL | Status: AC
  Administered 2016-11-10: 324 mg via ORAL
  Filled 2016-11-10: qty 4

## 2016-11-10 MED ORDER — WARFARIN SODIUM 2.5 MG PO TABS: 2.5000 mg | ORAL_TABLET | ORAL | Status: DC

## 2016-11-10 MED ORDER — ALLOPURINOL 300 MG PO TABS
300.0000 mg | ORAL_TABLET | Freq: Every day | ORAL | Status: DC
  Administered 2016-11-11: 300 mg via ORAL
  Filled 2016-11-10: qty 1

## 2016-11-10 MED ORDER — FLUTICASONE PROPIONATE 50 MCG/ACT NA SUSP
1.0000 | Freq: Two times a day (BID) | NASAL | Status: DC
  Administered 2016-11-10 – 2016-11-11 (×2): 1 via NASAL
  Filled 2016-11-10: qty 16

## 2016-11-10 MED ORDER — BUDESONIDE 0.5 MG/2ML IN SUSP
0.5000 mg | Freq: Two times a day (BID) | RESPIRATORY_TRACT | Status: DC
  Administered 2016-11-10 – 2016-11-11 (×2): 0.5 mg via RESPIRATORY_TRACT
  Filled 2016-11-10 (×2): qty 2

## 2016-11-10 MED ORDER — LORATADINE 10 MG PO TABS
10.0000 mg | ORAL_TABLET | Freq: Every day | ORAL | Status: DC
  Administered 2016-11-11: 10 mg via ORAL
  Filled 2016-11-10: qty 1

## 2016-11-10 MED ORDER — WARFARIN SODIUM 5 MG PO TABS: 5.0000 mg | ORAL_TABLET | Freq: Once | ORAL | Status: DC

## 2016-11-10 MED ORDER — VITAMIN C 500 MG PO TABS
1000.0000 mg | ORAL_TABLET | Freq: Every day | ORAL | Status: DC
  Administered 2016-11-11: 1000 mg via ORAL
  Filled 2016-11-10: qty 2

## 2016-11-10 MED ORDER — ACETAMINOPHEN 325 MG PO TABS
650.0000 mg | ORAL_TABLET | ORAL | Status: DC | PRN
  Administered 2016-11-10: 650 mg via ORAL
  Filled 2016-11-10: qty 2

## 2016-11-10 MED ORDER — PIMECROLIMUS 1 % EX CREA: 1.0000 "application " | TOPICAL_CREAM | Freq: Every day | CUTANEOUS | Status: DC

## 2016-11-10 MED ORDER — NITROGLYCERIN 0.4 MG SL SUBL
0.4000 mg | SUBLINGUAL_TABLET | SUBLINGUAL | Status: DC | PRN
  Administered 2016-11-10 (×2): 0.4 mg via SUBLINGUAL
  Filled 2016-11-10: qty 1

## 2016-11-10 MED ORDER — ALBUTEROL SULFATE (2.5 MG/3ML) 0.083% IN NEBU: 3.0000 mL | INHALATION_SOLUTION | Freq: Every day | RESPIRATORY_TRACT | Status: DC | PRN

## 2016-11-10 MED ORDER — DICYCLOMINE HCL 10 MG PO CAPS
10.0000 mg | ORAL_CAPSULE | Freq: Four times a day (QID) | ORAL | Status: DC | PRN
Start: 2016-11-10 — End: 2016-11-11
  Filled 2016-11-10: qty 1

## 2016-11-10 MED ORDER — PANTOPRAZOLE SODIUM 40 MG PO TBEC
40.0000 mg | DELAYED_RELEASE_TABLET | Freq: Every day | ORAL | Status: DC
  Administered 2016-11-11: 40 mg via ORAL
  Filled 2016-11-10: qty 1

## 2016-11-10 MED ORDER — POTASSIUM CHLORIDE CRYS ER 20 MEQ PO TBCR
20.0000 meq | EXTENDED_RELEASE_TABLET | Freq: Two times a day (BID) | ORAL | Status: DC
  Administered 2016-11-10 – 2016-11-11 (×2): 20 meq via ORAL
  Filled 2016-11-10 (×2): qty 1

## 2016-11-10 NOTE — Progress Notes (Signed)
ANTICOAGULATION CONSULT NOTE - Initial Consult  Pharmacy Consult for warfarin Indication: history of PE  Allergies  Allergen Reactions  . Morphine And Related Nausea Only       . Penicillins Nausea And Vomiting and Other (See Comments)    seizures & convulsions; passed out; last time was when pt was 20  Has patient had a PCN reaction causing immediate rash, facial/tongue/throat swelling, SOB or lightheadedness with hypotension: Yes Has patient had a PCN reaction causing severe rash involving mucus membranes or skin necrosis: No Has patient had a PCN reaction that required hospitalization: No Has patient had a PCN reaction occurring within the last 10 years: No If all of the above answers are "NO", then m  . Tape Other (See Comments)    PLEASE USE COBAN WRAP; TAPE WILL TEAR OFF THE PATENT'S SKIN!!    Patient Measurements:    Vital Signs: Temp: 97.7 F (36.5 C) (04/24 1502) Temp Source: Oral (04/24 1502) BP: 124/60 (04/24 2130) Pulse Rate: 77 (04/24 2130)  Labs:  Recent Labs  11/10/16 1217 11/10/16 1727 11/10/16 1945  HGB 13.9  --   --   HCT 41.1  --   --   PLT 239  --   --   LABPROT  --  22.2*  --   INR  --  1.92  --   CREATININE 0.82  --  0.75  TROPONINI  --   --  <0.03    CrCl cannot be calculated (Unknown ideal weight.).   Medical History: Past Medical History:  Diagnosis Date  . Anxiety   . Asthma   . Blood transfusion 07/2008   "related to chemo" (07/25/2012)  . Breast cancer ([place])    breast lft  . Chronic lower back pain   . Clotting disorder ([place])    "sudden chest pain; found multiple blood clots in both lungs; they think it was from Tamoxifen" (07/25/2012)  . Exertional dyspnea   . Fibromyalgia   . GERD (gastroesophageal reflux disease)   . Gout   . H/O hiatal hernia   . Heart murmur   . Hx of blood clots 02/2011   "sudden chest pain; found multiple blood clots in both lungs; they think it was from Tamoxifen; haven't had any since 11/2011"  (07/25/2012)  . Hypertension   . Left knee DJD   . OSA on CPAP   . Osteoarthritis    "pretty much everywhere" (07/25/2012)  . Pulmonary embolism ([place]) 02/2011  . Right knee DJD   . Seizures ([place])    "seizures vs convulsions related to PCN when I was a child" (07/25/2012)  . Subcutaneous hematoma 07/25/2012   Intraoperative cultures are pending on this fluid collection that was just outside the left knee joint.  Gram stain had few WBC monos and polys.    Assessment: 76 yo woman on warfarin PTA for history of PE. Last dose 4/23 per patient. PTA dose 5mg  MWF and 2.5mg  every other day of the week. INR today 1.92. Will go ahead and give 5mg  tonight, then can likely resume home regimen.  Goal of Therapy:  INR 2-3 Monitor platelets by anticoagulation protocol: Yes   Plan:  Warfarin 5mg *1 tonight Daily INR CBC as indicated  Melburn Popper 11/10/2016,10:14 PM

## 2016-11-10 NOTE — ED Triage Notes (Signed)
Pt presents for evaluation of central cp with radiation to back/shoulders starting last night. Pt reports no cardiac hx. States had been going up and down stairs to attic repeatedly yesterday and began to feel tightness to shoulders/upper back. Pt denies sob, states felt dizzy/hot, states concerned her sugar was low. Pt axo x4.

## 2016-11-10 NOTE — Telephone Encounter (Signed)
Patient transferred from operator.  I spoke to patient. Pt c/o SOB, chest pressure/tightness, back pain, and weakness.  She c/o feeling being intermittent and worsening. I advised patient she should be evaluated immediately. I advised pt to call 911 or go straight to ED. She voiced understanding and agreed with plan.

## 2016-11-10 NOTE — Telephone Encounter (Signed)
New message    Pt is calling because she states she was going up and down her steps and got very weak. She says she had a strange pain feeling in her shoulders and like she was going to pass out. She states like she could hold herself up. She said she was short of breath and coughing.  Pt c/o Shortness Of Breath: STAT if SOB developed within the last 24 hours or pt is noticeably SOB on the phone  1. Are you currently SOB (can you hear that pt is SOB on the phone)? No-she said it's calmed down cause she is sitting down  2. How long have you been experiencing SOB? Just started 30 minutes ago  3. Are you SOB when sitting or when up moving around? Up moving around  4. Are you currently experiencing any other symptoms? Feeling strange.

## 2016-11-10 NOTE — Consult Note (Addendum)
CARDIOLOGY CONSULT NOTE   Patient ID: Lydia Campbell MRN: [id number], DOB/AGE: 1940/12/25   Admit date: 11/10/2016 Date of Consult: 11/10/2016  Primary Physician: Galen Manila, MD Primary Cardiologist: Dr Angelena Form  Reason for consult: Chest pain Consulting physician: Dr Julianne Rice  Problem List  Past Medical History:  Diagnosis Date  . Anxiety   . Asthma   . Blood transfusion 07/2008   "related to chemo" (07/25/2012)  . Breast cancer ([place])    breast lft  . Chronic lower back pain   . Clotting disorder ([place])    "sudden chest pain; found multiple blood clots in both lungs; they think it was from Tamoxifen" (07/25/2012)  . Exertional dyspnea   . Fibromyalgia   . GERD (gastroesophageal reflux disease)   . Gout   . H/O hiatal hernia   . Heart murmur   . Hx of blood clots 02/2011   "sudden chest pain; found multiple blood clots in both lungs; they think it was from Tamoxifen; haven't had any since 11/2011" (07/25/2012)  . Hypertension   . Left knee DJD   . OSA on CPAP   . Osteoarthritis    "pretty much everywhere" (07/25/2012)  . Pulmonary embolism ([place]) 02/2011  . Right knee DJD   . Seizures ([place])    "seizures vs convulsions related to PCN when I was a child" (07/25/2012)  . Subcutaneous hematoma 07/25/2012   Intraoperative cultures are pending on this fluid collection that was just outside the left knee joint.  Gram stain had few WBC monos and polys.    Past Surgical History:  Procedure Laterality Date  . BREAST LUMPECTOMY  02/2008   "left" (07/25/2012)  . BREAST SURGERY    . KNEE ARTHROSCOPY  2001;  2009   "right; left" (07/25/2012)  . LEFT HEART CATHETERIZATION WITH CORONARY ANGIOGRAM N/A 10/26/2014   Procedure: LEFT HEART CATHETERIZATION WITH CORONARY ANGIOGRAM;  Surgeon: Burnell Blanks, MD;  Location: [facility] CATH LAB;  Service: Cardiovascular;  Laterality: N/A;  . MASTECTOMY MODIFIED RADICAL / SIMPLE / COMPLETE  03/28/2008   "left" (07/25/2012)  . PORTACATH PLACEMENT   03/28/2008; ?05/2008   "left; right" (07/25/2012)  . TONSILLECTOMY  1950  . TOTAL KNEE ARTHROPLASTY  12/21/2011   Procedure: TOTAL KNEE ARTHROPLASTY;  Surgeon: Lorn Junes, MD;  Location: [facility];  Service: Orthopedics;  Laterality: Right;  DR [place] THIS CASE  . TOTAL KNEE ARTHROPLASTY WITH REVISION COMPONENTS  07/25/2012   Procedure: TOTAL KNEE ARTHROPLASTY WITH REVISION COMPONENTS;  Surgeon: Lorn Junes, MD;  Location: [facility];  Service: Orthopedics;  Laterality: Left;  . TUBAL LIGATION  ~ 1976  . VAGINAL HYSTERECTOMY  ~ 1983   "endometrosis" (07/25/2012)     Allergies  Allergies  Allergen Reactions  . Morphine And Related Nausea Only       . Penicillins Nausea And Vomiting and Other (See Comments)    seizures & convulsions; passed out; last time was when pt was 20  Has patient had a PCN reaction causing immediate rash, facial/tongue/throat swelling, SOB or lightheadedness with hypotension: Yes Has patient had a PCN reaction causing severe rash involving mucus membranes or skin necrosis: No Has patient had a PCN reaction that required hospitalization: No Has patient had a PCN reaction occurring within the last 10 years: No If all of the above answers are "NO", then m  . Tape Other (See Comments)    PLEASE USE COBAN WRAP; TAPE WILL TEAR OFF THE PATENT'S SKIN!!  HPI   Lydia Campbell is a 76 y.o. female with prior medical h/o obesity, hypertension, breast cancer post chemotherapy, pulmonary embolism in 2012 on chronic therapy with coumadin who presented with central chest tightness and upper back tightness that started today going up and down multiple stairs to the attic. She had some lightheadedness and feeling warm. Mild shortness of breath. Says she's had a nonproductive cough. She also states noticed over the last few weeks she is getting more dyspneic with her normal exertion. Chest tightness is now significantly improved though still present.   She also  mentioned that in the last 2 months while walking with her husband she has been getting SOB and sweaty, it resolves at rest. She has decreased the walking distance because of that. She describes simultaneous blurry mind, without falls or loss of consciousness. She is worrying if she is hypoglycemic as both of her parents suffered from episodes of hypoglycemia without diabetes.  She is on Coumadin for history of PE. She presented with similar chest pain in 10/2014. She underwent a cardiac cath for an abnomal stress test with findings of mild non-obstructive CAD, normal LVEF.   She has significant h/o premature CAD, father had MI in 18' and CVA. Brother MI at 68, also CVA. She quit smoking in 1970'.  Inpatient Medications  Family History Family History  Problem Relation Age of Onset  . Heart disease Mother   . Diabetes Father   . Heart disease Father   . CVA Father   . Colon polyps Sister     adenomatous  . Heart disease Sister   . Heart disease Brother   . Breast cancer Cousin     pat side, mat and pat aunts  . Colon cancer Neg Hx     Social History Social History   Social History  . Marital status: Married    Spouse name: N/A  . Number of children: 1  . Years of education: N/A   Occupational History  . retired    Social History Main Topics  . Smoking status: Former Smoker    Packs/day: 1.50    Years: 5.00    Types: Cigarettes    Quit date: 12/06/1968  . Smokeless tobacco: Never Used  . Alcohol use No     Comment: 07/25/2012 "bottle of champaign on our anniversary q yr; add a drink once in a blue moon for a celebration"  . Drug use: No  . Sexual activity: Yes    Partners: Male   Other Topics Concern  . Not on file   Social History Narrative  . No narrative on file    Review of Systems  General:  No chills, fever, night sweats or weight changes.  Cardiovascular:  No chest pain, dyspnea on exertion, edema, orthopnea, palpitations, paroxysmal nocturnal  dyspnea. Dermatological: No rash, lesions/masses Respiratory: No cough, dyspnea Urologic: No hematuria, dysuria Abdominal:   No nausea, vomiting, diarrhea, bright red blood per rectum, melena, or hematemesis Neurologic:  No visual changes, wkns, changes in mental status. All other systems reviewed and are otherwise negative except as noted above.  Physical Exam  Blood pressure (!) 118/55, pulse (!) 57, temperature 97.7 F (36.5 C), temperature source Oral, resp. rate 17, SpO2 96 %.  General: Pleasant, NAD Psych: Normal affect. Neuro: Alert and oriented X 3. Moves all extremities spontaneously. HEENT: Normal  Neck: Supple without bruits or JVD. Lungs:  Resp regular and unlabored, CTA. Heart: RRR no s3, s4, or murmurs. Abdomen: Soft,  non-tender, non-distended, BS + x 4.  Extremities: No clubbing, cyanosis or edema. DP/PT/Radials 2+ and equal bilaterally.  Labs  No results for input(s): CKTOTAL, CKMB, TROPONINI in the last 72 hours. Lab Results  Component Value Date   WBC 8.6 11/10/2016   HGB 13.9 11/10/2016   HCT 41.1 11/10/2016   MCV 96.9 11/10/2016   PLT 239 11/10/2016    Recent Labs Lab 11/10/16 1217  NA 139  K 3.4*  CL 106  CO2 22  BUN 24*  CREATININE 0.82  CALCIUM 9.4  GLUCOSE 91   Lab Results  Component Value Date   CHOL 187 10/24/2014   HDL 51.20 10/24/2014   LDLCALC 111 (H) 10/24/2014   TRIG 125.0 10/24/2014   No results found for: DDIMER Invalid input(s): POCBNP  Radiology/Studies  Dg Chest 2 View  Result Date: 11/10/2016 CLINICAL DATA:  Weakness, lightheadedness, cough EXAM: CHEST  2 VIEW COMPARISON:  12/09/2011 FINDINGS: Scarring in the left lung base. Right lung is clear. Heart is normal size. No effusions. Degenerative changes in the thoracic spine. No acute bony abnormality. IMPRESSION: Left basilar scarring.  No active disease. Electronically Signed   By: Rolm Baptise M.D.   On: 11/10/2016 13:04   LHC: 10/26/2014 Angiographic Findings: Left  main: No obstructive disease.  Left Anterior Descending Artery: Large caliber vessel that courses to the apex. Moderate caliber diagonal branch. No obstructive disease.  Circumflex Artery: Large caliber vessel with large obtuse marginal branch. The proximal vessel has mild plaque. The obtuse marginal branch has a focal 20% stenosis.  Right Coronary Artery: Large dominant vessel with minor luminal irregularities in the mid vessel.   Left Ventricular Angiogram: LVEF=60%  Impression: 1. Mild non-obstructive CAD 2. Normal LV systolic function 3. Non-cardiac chest pain  Recommendations: Medical management of mild CAD.         Echocardiogram 10/2014 - Left ventricle: The cavity size was normal. Systolic function was normal. The estimated ejection fraction was in the range of 55% to 60%. Wall motion was normal; there were no regional wall motion abnormalities. - Mitral valve: There was trivial regurgitation. - Pulmonic valve: There was trivial regurgitation.  ECG: SR, PVCs, low voltage ECG, unchanged from 2016    ASSESSMENT AND PLAN  1. Chest pain  - and DOE, sounds typical, however mild nonobstructive CAD on cath in 2016 - troponin negative x 1, ECG unchanged from 2016 - we will schedule an exercise nuclear stress test for tomorrow. The stress test should also evaluate symptoms on exertion, BO response and glucose check.  2. H/o PE in 2012 - on chronic coumadin  3. CAD - on no BB as she is bradycardic, we will discuss initiation of statins  4. Hyperlipidemia - discuss initiation of statins  Signed, Ena Dawley, MD, [facility] 11/10/2016, 6:47 PM

## 2016-11-10 NOTE — Progress Notes (Signed)
Visit made to patients room to set her up on Cpap.  Pt states she wears nasal pillows at home and will not be able to tolerate our mask.  She stated she goes without it at times.  RN made aware

## 2016-11-10 NOTE — ED Provider Notes (Signed)
[facility] DEPT Provider Note   CSN: [id number] Arrival date & time: 11/10/16  1159     History   Chief Complaint Chief Complaint  Patient presents with  . Chest Pain    HPI Lydia Campbell is a 76 y.o. female.  HPI She presents with central chest tightness and upper back tightness that started today going up and down multiple stairs to the attic. She had some lightheadedness and feeling warm. Mild shortness of breath. Says she's had a nonproductive cough. She also states noticed over the last few weeks she is getting more dyspneic with her normal exertion. Chest tightness is now significantly improved though still present. Cardiac cath 2 years ago with no intervention. She is on Coumadin for history of PE.  Past Medical History:  Diagnosis Date  . Anxiety   . Asthma   . Blood transfusion 07/2008   "related to chemo" (07/25/2012)  . Breast cancer ([place])    breast lft  . Chronic lower back pain   . Clotting disorder ([place])    "sudden chest pain; found multiple blood clots in both lungs; they think it was from Tamoxifen" (07/25/2012)  . Exertional dyspnea   . Fibromyalgia   . GERD (gastroesophageal reflux disease)   . Gout   . H/O hiatal hernia   . Heart murmur   . Hx of blood clots 02/2011   "sudden chest pain; found multiple blood clots in both lungs; they think it was from Tamoxifen; haven't had any since 11/2011" (07/25/2012)  . Hypertension   . Left knee DJD   . OSA on CPAP   . Osteoarthritis    "pretty much everywhere" (07/25/2012)  . Pulmonary embolism ([place]) 02/2011  . Right knee DJD   . Seizures ([place])    "seizures vs convulsions related to PCN when I was a child" (07/25/2012)  . Subcutaneous hematoma 07/25/2012   Intraoperative cultures are pending on this fluid collection that was just outside the left knee joint.  Gram stain had few WBC monos and polys.    Patient Active Problem List   Diagnosis Date Noted  . Chest pain with moderate risk for cardiac etiology  11/10/2016  . Systolic murmur 76/72/0947  . DOE (dyspnea on exertion) 10/11/2014  . Urinary tract infection due to Proteus currently on day 3 of Cipro 07/26/2012  . Subcutaneous hematoma 07/25/2012  . Blood loss anemia 12/22/2011  . Hypokalemia 12/22/2011  . GERD (gastroesophageal reflux disease)   . Arthritis   . Sleep apnea   . Hypertension   . Hx of blood clots   . Fibromyalgia   . Anxiety   . Right knee DJD   . Left knee DJD   . Breast cancer ([place]) 06/09/2011  . MITRAL REGURGITATION 05/13/2009    Past Surgical History:  Procedure Laterality Date  . BREAST LUMPECTOMY  02/2008   "left" (07/25/2012)  . BREAST SURGERY    . KNEE ARTHROSCOPY  2001;  2009   "right; left" (07/25/2012)  . LEFT HEART CATHETERIZATION WITH CORONARY ANGIOGRAM N/A 10/26/2014   Procedure: LEFT HEART CATHETERIZATION WITH CORONARY ANGIOGRAM;  Surgeon: Burnell Blanks, MD;  Location[facility] CATH LAB;  Service: Cardiovascular;  Laterality: N/A;  . MASTECTOMY MODIFIED RADICAL / SIMPLE / COMPLETE  03/28/2008   "left" (07/25/2012)  . PORTACATH PLACEMENT  03/28/2008; ?05/2008   "left; right" (07/25/2012)  . TONSILLECTOMY  1950  . TOTAL KNEE ARTHROPLASTY  12/21/2011   Procedure: TOTAL KNEE ARTHROPLASTY;  Surgeon: Lorn Junes, MD;  Location: [facility];  Service: Orthopedics;  Laterality: Right;  DR Freeland THIS CASE  . TOTAL KNEE ARTHROPLASTY WITH REVISION COMPONENTS  07/25/2012   Procedure: TOTAL KNEE ARTHROPLASTY WITH REVISION COMPONENTS;  Surgeon: Lorn Junes, MD;  Location: [facility];  Service: Orthopedics;  Laterality: Left;  . TUBAL LIGATION  ~ 1976  . VAGINAL HYSTERECTOMY  ~ 1983   "endometrosis" (07/25/2012)    OB History    No data available       Home Medications    Prior to Admission medications   Medication Sig Start Date End Date Taking? Authorizing Provider  acetaminophen (TYLENOL) 325 MG tablet Take 2 tablets (650 mg total) by mouth every 6 (six) hours as needed for fever (or Fever  >/= 101). Patient taking differently: Take 650 mg by mouth every 6 (six) hours as needed (pain or fever).  07/26/12  Yes Kirstin Shepperson, PA-C  acyclovir (ZOVIRAX) 400 MG tablet Take 400 mg by mouth 3 (three) times daily as needed. FOR OUTBREAKS OF FEVER BLISTERS 10/12/16  Yes Historical Provider, MD  albuterol (PROAIR HFA) 108 (90 Base) MCG/ACT inhaler Inhale 2 puffs into the lungs daily as needed for wheezing or shortness of breath.   Yes Historical Provider, MD  allopurinol (ZYLOPRIM) 300 MG tablet Take 300 mg by mouth daily.    Yes Historical Provider, MD  APPLE CIDER VINEGAR PO Take 5 mLs by mouth every morning.   Yes Historical Provider, MD  Ascorbic Acid (VITAMIN C) 1000 MG tablet Take 1,000 mg by mouth daily.    Yes Historical Provider, MD  B Complex-Biotin-FA (B-COMPLEX PO) Take 100 mg by mouth at bedtime.    Yes Historical Provider, MD  benzonatate (TESSALON) 100 MG capsule Take 100 mg by mouth 3 (three) times daily as needed for cough.  04/16/15  Yes Historical Provider, MD  Biotin 5000 MCG CAPS Take 1 capsule by mouth daily.    Yes Historical Provider, MD  budesonide (PULMICORT) 0.5 MG/2ML nebulizer solution Take 0.5 mg by nebulization 2 (two) times daily.   Yes Historical Provider, MD  calcium carbonate (CALCIUM 500) 1250 MG tablet Take 1 tablet by mouth daily.   Yes Historical Provider, MD  cetirizine (ZYRTEC) 10 MG tablet Take 10 mg by mouth daily.    Yes Historical Provider, MD  cholecalciferol (VITAMIN D) 400 UNITS TABS Take 400 Units by mouth daily.    Yes Historical Provider, MD  dicyclomine (BENTYL) 10 MG capsule Take 1 capsule (10 mg total) by mouth 4 (four) times daily -  before meals and at bedtime. Patient taking differently: Take 10 mg by mouth 4 (four) times daily as needed for spasms.  04/30/16  Yes Ladene Artist, MD  fluticasone (FLONASE) 50 MCG/ACT nasal spray Place 1 spray into both nostrils 2 (two) times daily.   Yes Historical Provider, MD  hydroxypropyl  methylcellulose (ISOPTO TEARS) 2.5 % ophthalmic solution Place 1 drop into both eyes 4 (four) times daily as needed for dry eyes. Dry eyes   Yes Historical Provider, MD  Hypertonic Nasal Wash (SINUS RINSE) PACK Place into the nose 2 (two) times daily.    Yes Historical Provider, MD  nabumetone (RELAFEN) 500 MG tablet Take 500 mg by mouth 2 (two) times daily as needed (pain).  05/03/13  Yes Historical Provider, MD  NON FORMULARY Patient uses a CPAP machine   Yes Historical Provider, MD  pantoprazole (PROTONIX) 40 MG tablet Take 40 mg by mouth daily before breakfast. 10/12/16  Yes Historical Provider, MD  pimecrolimus (ELIDEL) 1 % cream Apply 1 application topically daily.   Yes Historical Provider, MD  potassium chloride SA (K-DUR,KLOR-CON) 20 MEQ tablet Take 20 mEq by mouth 2 (two) times daily. 10/12/16  Yes Historical Provider, MD  triamterene-hydrochlorothiazide (MAXZIDE) 75-50 MG per tablet Take 1 tablet by mouth 2 (two) times daily.  04/15/11  Yes Historical Provider, MD  UNABLE TO FIND Rx: [place] (Quantity: 6) Dx: 174.9; Left Mastectomy 04/18/13  Yes Erroll Luna, MD  warfarin (COUMADIN) 5 MG tablet Take 2.5-5 mg by mouth See admin instructions. 2.5 mg at bedtime on Sun/Tues/Thurs/Sat and 5 mg on Mon/Wed/Fri 05/05/11  Yes Historical Provider, MD    Family History Family History  Problem Relation Age of Onset  . Heart disease Mother   . Diabetes Father   . Heart disease Father   . CVA Father   . Colon polyps Sister     adenomatous  . Heart disease Sister   . Heart disease Brother   . Breast cancer Cousin     pat side, mat and pat aunts  . Colon cancer Neg Hx     Social History Social History  Substance Use Topics  . Smoking status: Former Smoker    Packs/day: 1.50    Years: 5.00    Types: Cigarettes    Quit date: 12/06/1968  . Smokeless tobacco: Never Used  . Alcohol use No     Comment: 07/25/2012 "bottle of champaign on our anniversary q yr; add a drink once  in a blue moon for a celebration"     Allergies   Morphine and related; Penicillins; and Tape   Review of Systems Review of Systems  Constitutional: Negative for chills and fever.  Eyes: Negative for visual disturbance.  Respiratory: Positive for cough, chest tightness and shortness of breath.   Cardiovascular: Negative for chest pain, palpitations and leg swelling.  Gastrointestinal: Negative for abdominal pain, diarrhea, nausea and vomiting.  Genitourinary: Negative for dysuria, flank pain and frequency.  Musculoskeletal: Positive for back pain and myalgias. Negative for neck pain and neck stiffness.  Skin: Negative for rash and wound.  Neurological: Positive for dizziness and light-headedness. Negative for weakness, numbness and headaches.  All other systems reviewed and are negative.    Physical Exam Updated Vital Signs BP (!) 118/47   Pulse 76   Temp 97.7 F (36.5 C) (Oral)   Resp 18   SpO2 95%   Physical Exam  Constitutional: She is oriented to person, place, and time. She appears well-developed and well-nourished. No distress.  HENT:  Head: Normocephalic and atraumatic.  Mouth/Throat: Oropharynx is clear and moist. No oropharyngeal exudate.  Eyes: EOM are normal. Pupils are equal, round, and reactive to light.  Neck: Normal range of motion. Neck supple. No JVD present.  Cardiovascular: Normal rate and regular rhythm.  Exam reveals no gallop and no friction rub.   No murmur heard. Pulmonary/Chest: Effort normal and breath sounds normal. No stridor. No respiratory distress. She has no wheezes. She has no rales. She exhibits no tenderness.  Abdominal: Soft. Bowel sounds are normal. There is no tenderness. There is no rebound and no guarding.  Musculoskeletal: Normal range of motion. She exhibits no edema or tenderness.  Bilateral Lower extremity swelling without pitting edema, asymmetry or tenderness. Distal pulses are 2+. Patient has mild tenderness to palpation over  the right trapezius. No midline thoracic lumbar tenderness. No CVA tenderness.  Neurological: She is alert and oriented  to person, place, and time.  5/5 motor in all extremities. Sensation fully intact.  Skin: Skin is warm and dry. No rash noted. No erythema.  Psychiatric: She has a normal mood and affect. Her behavior is normal.  Nursing note and vitals reviewed.    ED Treatments / Results  Labs (all labs ordered are listed, but only abnormal results are displayed) Labs Reviewed  BASIC METABOLIC PANEL - Abnormal; Notable for the following:       Result Value   Potassium 3.4 (*)    BUN 24 (*)    All other components within normal limits  PROTIME-INR - Abnormal; Notable for the following:    Prothrombin Time 22.2 (*)    All other components within normal limits  CBC  BRAIN NATRIURETIC PEPTIDE  COMPREHENSIVE METABOLIC PANEL  CBC  CREATININE, SERUM  TROPONIN I  TROPONIN I  TROPONIN I  LIPID PANEL  I-STAT TROPOININ, ED  I-STAT TROPOININ, ED    EKG  EKG Interpretation  Date/Time:  Tuesday November 10 2016 12:15:27 EDT Ventricular Rate:  72 PR Interval:  190 QRS Duration: 76 QT Interval:  412 QTC Calculation: 451 R Axis:   -15 Text Interpretation:  Sinus rhythm with sinus arrhythmia with occasional Premature ventricular complexes Low voltage QRS Cannot rule out Anterior infarct , age undetermined Abnormal ECG Confirmed by Lita Mains  MD, Katherleen Folkes ([id number]) on 11/10/2016 4:32:08 PM       Radiology Dg Chest 2 View  Result Date: 11/10/2016 CLINICAL DATA:  Weakness, lightheadedness, cough EXAM: CHEST  2 VIEW COMPARISON:  12/09/2011 FINDINGS: Scarring in the left lung base. Right lung is clear. Heart is normal size. No effusions. Degenerative changes in the thoracic spine. No acute bony abnormality. IMPRESSION: Left basilar scarring.  No active disease. Electronically Signed   By: Rolm Baptise M.D.   On: 11/10/2016 13:04    Procedures Procedures (including critical care  time)  Medications Ordered in ED Medications  nitroGLYCERIN (NITROSTAT) SL tablet 0.4 mg (0.4 mg Sublingual Given 11/10/16 1757)  nitroGLYCERIN (NITROSTAT) SL tablet 0.4 mg (not administered)  acetaminophen (TYLENOL) tablet 650 mg (not administered)  ondansetron (ZOFRAN) injection 4 mg (not administered)  zolpidem (AMBIEN) tablet 5 mg (not administered)  enoxaparin (LOVENOX) injection 40 mg (not administered)  albuterol (PROVENTIL HFA;VENTOLIN HFA) 108 (90 Base) MCG/ACT inhaler 2 puff (not administered)  allopurinol (ZYLOPRIM) tablet 300 mg (not administered)  vitamin C (ASCORBIC ACID) tablet 1,000 mg (not administered)  B-Complex TABS 1 tablet (not administered)  budesonide (PULMICORT) nebulizer solution 0.5 mg (not administered)  calcium carbonate (OS-CAL - dosed in mg of elemental calcium) tablet 500 mg of elemental calcium (not administered)  loratadine (CLARITIN) tablet 10 mg (not administered)  cholecalciferol (VITAMIN D) tablet 400 Units (not administered)  dicyclomine (BENTYL) capsule 10 mg (not administered)  fluticasone (FLONASE) 50 MCG/ACT nasal spray 1 spray (not administered)  hydroxypropyl methylcellulose / hypromellose (ISOPTO TEARS / GONIOVISC) 2.5 % ophthalmic solution 1 drop (not administered)  nabumetone (RELAFEN) tablet 500 mg (not administered)  pantoprazole (PROTONIX) EC tablet 40 mg (not administered)  pimecrolimus (ELIDEL) 1 % cream 1 application (not administered)  potassium chloride SA (K-DUR,KLOR-CON) CR tablet 20 mEq (not administered)  triamterene-hydrochlorothiazide (MAXZIDE) 75-50 MG per tablet 1 tablet (not administered)  warfarin (COUMADIN) tablet 2.5-5 mg (not administered)  aspirin chewable tablet 324 mg (324 mg Oral Given 11/10/16 1744)     Initial Impression / Assessment and Plan / ED Course  I have reviewed the triage vital signs and the  nursing notes.  Pertinent labs & imaging results that were available during my care of the patient were  reviewed by me and considered in my medical decision making (see chart for details).     Given aspirin and sublingual nitroglycerin in the emergency department. She is now chest pain-free after second nitroglycerin. Discuss with cardiology will see the patient in the emergency department and disposition.  Final Clinical Impressions(s) / ED Diagnoses   Final diagnoses:  Chest pain with moderate risk for cardiac etiology    New Prescriptions New Prescriptions   No medications on file     Julianne Rice, MD 11/10/16 2057

## 2016-11-10 NOTE — ED Notes (Signed)
After second nitro pt states pressure is completely gone

## 2016-11-10 NOTE — H&P (Signed)
CARDIOLOGY CONSULT NOTE   Patient ID: Lydia Campbell MRN: [id number], DOB/AGE: 12-21-1940   Admit date: 11/10/2016 Date of Consult: 11/10/2016  Primary Physician: Galen Manila, MD Primary Cardiologist: Dr Angelena Form  Reason for consult: Chest pain Consulting physician: Dr Julianne Rice  Problem List  Past Medical History:  Diagnosis Date  . Anxiety   . Asthma   . Blood transfusion 07/2008   "related to chemo" (07/25/2012)  . Breast cancer ([place])    breast lft  . Chronic lower back pain   . Clotting disorder ([place])    "sudden chest pain; found multiple blood clots in both lungs; they think it was from Tamoxifen" (07/25/2012)  . Exertional dyspnea   . Fibromyalgia   . GERD (gastroesophageal reflux disease)   . Gout   . H/O hiatal hernia   . Heart murmur   . Hx of blood clots 02/2011   "sudden chest pain; found multiple blood clots in both lungs; they think it was from Tamoxifen; haven't had any since 11/2011" (07/25/2012)  . Hypertension   . Left knee DJD   . OSA on CPAP   . Osteoarthritis    "pretty much everywhere" (07/25/2012)  . Pulmonary embolism ([place]) 02/2011  . Right knee DJD   . Seizures ([place])    "seizures vs convulsions related to PCN when I was a child" (07/25/2012)  . Subcutaneous hematoma 07/25/2012   Intraoperative cultures are pending on this fluid collection that was just outside the left knee joint.  Gram stain had few WBC monos and polys.    Past Surgical History:  Procedure Laterality Date  . BREAST LUMPECTOMY  02/2008   "left" (07/25/2012)  . BREAST SURGERY    . KNEE ARTHROSCOPY  2001;  2009   "right; left" (07/25/2012)  . LEFT HEART CATHETERIZATION WITH CORONARY ANGIOGRAM N/A 10/26/2014   Procedure: LEFT HEART CATHETERIZATION WITH CORONARY ANGIOGRAM;  Surgeon: Burnell Blanks, MD;  Location: [facility] CATH LAB;  Service: Cardiovascular;  Laterality: N/A;  . MASTECTOMY MODIFIED RADICAL / SIMPLE / COMPLETE  03/28/2008   "left" (07/25/2012)  . PORTACATH PLACEMENT   03/28/2008; ?05/2008   "left; right" (07/25/2012)  . TONSILLECTOMY  1950  . TOTAL KNEE ARTHROPLASTY  12/21/2011   Procedure: TOTAL KNEE ARTHROPLASTY;  Surgeon: Lorn Junes, MD;  Location: [facility];  Service: Orthopedics;  Laterality: Right;  DR Pierce THIS CASE  . TOTAL KNEE ARTHROPLASTY WITH REVISION COMPONENTS  07/25/2012   Procedure: TOTAL KNEE ARTHROPLASTY WITH REVISION COMPONENTS;  Surgeon: Lorn Junes, MD;  Location: [facility];  Service: Orthopedics;  Laterality: Left;  . TUBAL LIGATION  ~ 1976  . VAGINAL HYSTERECTOMY  ~ 1983   "endometrosis" (07/25/2012)     Allergies  Allergies  Allergen Reactions  . Morphine And Related Nausea Only       . Penicillins Nausea And Vomiting and Other (See Comments)    seizures & convulsions; passed out; last time was when pt was 20  Has patient had a PCN reaction causing immediate rash, facial/tongue/throat swelling, SOB or lightheadedness with hypotension: Yes Has patient had a PCN reaction causing severe rash involving mucus membranes or skin necrosis: No Has patient had a PCN reaction that required hospitalization: No Has patient had a PCN reaction occurring within the last 10 years: No If all of the above answers are "NO", then m  . Tape Other (See Comments)    PLEASE USE COBAN WRAP; TAPE WILL TEAR OFF THE PATENT'S SKIN!!  HPI   Lydia Campbell is a 76 y.o. female with prior medical h/o obesity, hypertension, breast cancer post chemotherapy, pulmonary embolism in 2012 on chronic therapy with coumadin who presented with central chest tightness and upper back tightness that started today going up and down multiple stairs to the attic. She had some lightheadedness and feeling warm. Mild shortness of breath. Says she's had a nonproductive cough. She also states noticed over the last few weeks she is getting more dyspneic with her normal exertion. Chest tightness is now significantly improved though still present.   She also  mentioned that in the last 2 months while walking with her husband she has been getting SOB and sweaty, it resolves at rest. She has decreased the walking distance because of that. She describes simultaneous blurry mind, without falls or loss of consciousness. She is worrying if she is hypoglycemic as both of her parents suffered from episodes of hypoglycemia without diabetes.  She is on Coumadin for history of PE. She presented with similar chest pain in 10/2014. She underwent a cardiac cath for an abnomal stress test with findings of mild non-obstructive CAD, normal LVEF.   She has significant h/o premature CAD, father had MI in 60' and CVA. Brother MI at 62, also CVA. She quit smoking in 1970'.  Inpatient Medications  Family History Family History  Problem Relation Age of Onset  . Heart disease Mother   . Diabetes Father   . Heart disease Father   . CVA Father   . Colon polyps Sister     adenomatous  . Heart disease Sister   . Heart disease Brother   . Breast cancer Cousin     pat side, mat and pat aunts  . Colon cancer Neg Hx     Social History Social History   Social History  . Marital status: Married    Spouse name: N/A  . Number of children: 1  . Years of education: N/A   Occupational History  . retired    Social History Main Topics  . Smoking status: Former Smoker    Packs/day: 1.50    Years: 5.00    Types: Cigarettes    Quit date: 12/06/1968  . Smokeless tobacco: Never Used  . Alcohol use No     Comment: 07/25/2012 "bottle of champaign on our anniversary q yr; add a drink once in a blue moon for a celebration"  . Drug use: No  . Sexual activity: Yes    Partners: Male   Other Topics Concern  . Not on file   Social History Narrative  . No narrative on file    Review of Systems  General:  No chills, fever, night sweats or weight changes.  Cardiovascular:  No chest pain, dyspnea on exertion, edema, orthopnea, palpitations, paroxysmal nocturnal  dyspnea. Dermatological: No rash, lesions/masses Respiratory: No cough, dyspnea Urologic: No hematuria, dysuria Abdominal:   No nausea, vomiting, diarrhea, bright red blood per rectum, melena, or hematemesis Neurologic:  No visual changes, wkns, changes in mental status. All other systems reviewed and are otherwise negative except as noted above.  Physical Exam  Blood pressure (!) 118/55, pulse (!) 57, temperature 97.7 F (36.5 C), temperature source Oral, resp. rate 17, SpO2 96 %.  General: Pleasant, NAD Psych: Normal affect. Neuro: Alert and oriented X 3. Moves all extremities spontaneously. HEENT: Normal  Neck: Supple without bruits or JVD. Lungs:  Resp regular and unlabored, CTA. Heart: RRR no s3, s4, or murmurs. Abdomen: Soft,  non-tender, non-distended, BS + x 4.  Extremities: No clubbing, cyanosis or edema. DP/PT/Radials 2+ and equal bilaterally.  Labs  No results for input(s): CKTOTAL, CKMB, TROPONINI in the last 72 hours. Lab Results  Component Value Date   WBC 8.6 11/10/2016   HGB 13.9 11/10/2016   HCT 41.1 11/10/2016   MCV 96.9 11/10/2016   PLT 239 11/10/2016    Recent Labs Lab 11/10/16 1217  NA 139  K 3.4*  CL 106  CO2 22  BUN 24*  CREATININE 0.82  CALCIUM 9.4  GLUCOSE 91   Lab Results  Component Value Date   CHOL 187 10/24/2014   HDL 51.20 10/24/2014   LDLCALC 111 (H) 10/24/2014   TRIG 125.0 10/24/2014   No results found for: DDIMER Invalid input(s): POCBNP  Radiology/Studies  Dg Chest 2 View  Result Date: 11/10/2016 CLINICAL DATA:  Weakness, lightheadedness, cough EXAM: CHEST  2 VIEW COMPARISON:  12/09/2011 FINDINGS: Scarring in the left lung base. Right lung is clear. Heart is normal size. No effusions. Degenerative changes in the thoracic spine. No acute bony abnormality. IMPRESSION: Left basilar scarring.  No active disease. Electronically Signed   By: Rolm Baptise M.D.   On: 11/10/2016 13:04   LHC: 10/26/2014 Angiographic Findings: Left  main: No obstructive disease.  Left Anterior Descending Artery: Large caliber vessel that courses to the apex. Moderate caliber diagonal branch. No obstructive disease.  Circumflex Artery: Large caliber vessel with large obtuse marginal branch. The proximal vessel has mild plaque. The obtuse marginal branch has a focal 20% stenosis.  Right Coronary Artery: Large dominant vessel with minor luminal irregularities in the mid vessel.   Left Ventricular Angiogram: LVEF=60%  Impression: 1. Mild non-obstructive CAD 2. Normal LV systolic function 3. Non-cardiac chest pain  Recommendations: Medical management of mild CAD.         Echocardiogram 10/2014 - Left ventricle: The cavity size was normal. Systolic function was normal. The estimated ejection fraction was in the range of 55% to 60%. Wall motion was normal; there were no regional wall motion abnormalities. - Mitral valve: There was trivial regurgitation. - Pulmonic valve: There was trivial regurgitation.  ECG: SR, PVCs, low voltage ECG, unchanged from 2016    ASSESSMENT AND PLAN  1. Chest pain  - and DOE, sounds typical, however mild nonobstructive CAD on cath in 2016 - troponin negative x 1, ECG unchanged from 2016 - we will schedule an exercise nuclear stress test for tomorrow. The stress test should also evaluate symptoms on exertion, BO response and glucose check.  2. H/o PE in 2012 - on chronic coumadin  3. CAD - on no BB as she is bradycardic, we will discuss initiation of statins  4. Hyperlipidemia - discuss initiation of statins  Signed, Ena Dawley, MD, [facility] 11/10/2016, 6:47 PM

## 2016-11-11 ENCOUNTER — Observation Stay (HOSPITAL_BASED_OUTPATIENT_CLINIC_OR_DEPARTMENT_OTHER): Payer: Medicare Other

## 2016-11-11 ENCOUNTER — Other Ambulatory Visit: Payer: Self-pay | Admitting: Emergency Medicine

## 2016-11-11 DIAGNOSIS — R0609 Other forms of dyspnea: Principal | ICD-10-CM

## 2016-11-11 DIAGNOSIS — R079 Chest pain, unspecified: Secondary | ICD-10-CM | POA: Diagnosis not present

## 2016-11-11 DIAGNOSIS — I25118 Atherosclerotic heart disease of native coronary artery with other forms of angina pectoris: Secondary | ICD-10-CM

## 2016-11-11 DIAGNOSIS — I10 Essential (primary) hypertension: Secondary | ICD-10-CM

## 2016-11-11 DIAGNOSIS — R42 Dizziness and giddiness: Secondary | ICD-10-CM | POA: Diagnosis not present

## 2016-11-11 DIAGNOSIS — K219 Gastro-esophageal reflux disease without esophagitis: Secondary | ICD-10-CM | POA: Diagnosis not present

## 2016-11-11 DIAGNOSIS — I493 Ventricular premature depolarization: Secondary | ICD-10-CM

## 2016-11-11 LAB — COMPREHENSIVE METABOLIC PANEL
ALK PHOS: 58 U/L (ref 38–126)
ALT: 17 U/L (ref 14–54)
ANION GAP: 11 (ref 5–15)
AST: 24 U/L (ref 15–41)
Albumin: 3.2 g/dL — ABNORMAL LOW (ref 3.5–5.0)
BILIRUBIN TOTAL: 0.5 mg/dL (ref 0.3–1.2)
BUN: 24 mg/dL — ABNORMAL HIGH (ref 6–20)
CALCIUM: 8.9 mg/dL (ref 8.9–10.3)
CO2: 21 mmol/L — AB (ref 22–32)
Chloride: 106 mmol/L (ref 101–111)
Creatinine, Ser: 0.85 mg/dL (ref 0.44–1.00)
Glucose, Bld: 95 mg/dL (ref 65–99)
Potassium: 3.4 mmol/L — ABNORMAL LOW (ref 3.5–5.1)
SODIUM: 138 mmol/L (ref 135–145)
TOTAL PROTEIN: 5.9 g/dL — AB (ref 6.5–8.1)

## 2016-11-11 LAB — PROTIME-INR
INR: 1.86
Prothrombin Time: 21.7 seconds — ABNORMAL HIGH (ref 11.4–15.2)

## 2016-11-11 LAB — CBC
HCT: 37.4 % (ref 36.0–46.0)
HEMOGLOBIN: 12.7 g/dL (ref 12.0–15.0)
MCH: 32.9 pg (ref 26.0–34.0)
MCHC: 34 g/dL (ref 30.0–36.0)
MCV: 96.9 fL (ref 78.0–100.0)
PLATELETS: 206 10*3/uL (ref 150–400)
RBC: 3.86 MIL/uL — AB (ref 3.87–5.11)
RDW: 13.9 % (ref 11.5–15.5)
WBC: 7.5 10*3/uL (ref 4.0–10.5)

## 2016-11-11 LAB — NM MYOCAR MULTI W/SPECT W/WALL MOTION / EF
Estimated workload: 7 METS
Exercise duration (min): 5 min
Exercise duration (sec): 1 s
MPHR: 147 {beats}/min
Peak HR: 144 {beats}/min
Percent HR: 97 %
Rest HR: 83 {beats}/min

## 2016-11-11 LAB — TROPONIN I

## 2016-11-11 LAB — GLUCOSE, CAPILLARY: Glucose-Capillary: 95 mg/dL (ref 65–99)

## 2016-11-11 LAB — LIPID PANEL
CHOL/HDL RATIO: 4.2 ratio
CHOLESTEROL: 175 mg/dL (ref 0–200)
HDL: 42 mg/dL (ref >40)
LDL Cholesterol: 110 mg/dL — ABNORMAL HIGH (ref 0–99)
Triglycerides: 116 mg/dL (ref <150)
VLDL: 23 mg/dL (ref 0–40)

## 2016-11-11 MED ORDER — TECHNETIUM TC 99M TETROFOSMIN IV KIT
10.0000 | PACK | Freq: Once | INTRAVENOUS | Status: AC | PRN
  Administered 2016-11-11: 10 via INTRAVENOUS

## 2016-11-11 MED ORDER — TRIAMTERENE-HCTZ 75-50 MG PO TABS: 1.0000 | ORAL_TABLET | Freq: Every day | ORAL | 3 refills | Status: DC

## 2016-11-11 MED ORDER — TECHNETIUM TC 99M TETROFOSMIN IV KIT
30.0000 | PACK | Freq: Once | INTRAVENOUS | Status: AC | PRN
  Administered 2016-11-11: 30 via INTRAVENOUS

## 2016-11-11 MED ORDER — REGADENOSON 0.4 MG/5ML IV SOLN
0.4000 mg | Freq: Once | INTRAVENOUS | Status: DC
  Filled 2016-11-11: qty 5

## 2016-11-11 MED ORDER — REGADENOSON 0.4 MG/5ML IV SOLN
INTRAVENOUS | Status: AC
  Filled 2016-11-11: qty 5

## 2016-11-11 NOTE — Progress Notes (Signed)
Progress Note  Patient Name: Lydia Campbell Date of Encounter: 11/11/2016  Primary Cardiologist: Angelena Form  Subjective   Pt seen in [place] for exercise nuclear stress test. No further chest pain. Not consistently compliant with her Cpap machine. No episodes of CP   Inpatient Medications    Scheduled Meds: . regadenoson      . allopurinol  300 mg Oral Daily  . B-complex with vitamin C  1 tablet Oral QHS  . budesonide  0.5 mg Nebulization BID  . calcium carbonate  1 tablet Oral Daily  . cholecalciferol  400 Units Oral Daily  . enoxaparin (LOVENOX) injection  40 mg Subcutaneous QHS  . fluticasone  1 spray Each Nare BID  . loratadine  10 mg Oral Daily  . pantoprazole  40 mg Oral QAC breakfast  . potassium chloride SA  20 mEq Oral BID  . regadenoson  0.4 mg Intravenous Once  . triamterene-hydrochlorothiazide  1 tablet Oral BID  . vitamin C  1,000 mg Oral Daily  . warfarin  5 mg Oral ONCE-1800  . Warfarin - Pharmacist Dosing Inpatient   Does not apply q1800   Continuous Infusions:  PRN Meds: acetaminophen, albuterol, dicyclomine, hydroxypropyl methylcellulose / hypromellose, nabumetone, nitroGLYCERIN, ondansetron (ZOFRAN) IV, zolpidem   Vital Signs    Vitals:   11/11/16 0429 11/11/16 0431 11/11/16 0733 11/11/16 1002  BP:  (!) 121/49  130/72  Pulse:  64  (!) 59  Resp:  18    Temp:  97.8 F (36.6 C)    TempSrc:  Oral    SpO2:  96% 96%   Weight: 221 lb 9 oz (100.5 kg)     Height:        Intake/Output Summary (Last 24 hours) at 11/11/16 1029 Last data filed at 11/11/16 9833  Gross per 24 hour  Intake                0 ml  Output              700 ml  Net             -700 ml   Filed Weights   11/10/16 2244 11/11/16 0429  Weight: 223 lb (101.2 kg) 221 lb 9 oz (100.5 kg)    Telemetry    NSR, HR 65 - Personally Reviewed  ECG     Physical Exam   GEN: No acute distress.   Neck: No JVD Cardiac: RRR, no murmurs, rubs, or gallops.  Respiratory: Clear to  auscultation bilaterally. GI: Soft, nontender, non-distended  Lydia: No edema; No deformity. Neuro:  Nonfocal  Psych: Normal affect   Labs    Chemistry Recent Labs Lab 11/10/16 1217 11/10/16 1945 11/11/16 0244  NA 139  --  138  K 3.4*  --  3.4*  CL 106  --  106  CO2 22  --  21*  GLUCOSE 91  --  95  BUN 24*  --  24*  CREATININE 0.82 0.75 0.85  CALCIUM 9.4  --  8.9  PROT  --   --  5.9*  ALBUMIN  --   --  3.2*  AST  --   --  24  ALT  --   --  17  ALKPHOS  --   --  58  BILITOT  --   --  0.5  GFRNONAA >60 >60 >60  GFRAA >60 >60 >60  ANIONGAP 11  --  11     Hematology Recent Labs  Lab 11/10/16 1217 11/11/16 0244  WBC 8.6 7.5  RBC 4.24 3.86*  HGB 13.9 12.7  HCT 41.1 37.4  MCV 96.9 96.9  MCH 32.8 32.9  MCHC 33.8 34.0  RDW 13.8 13.9  PLT 239 206    Cardiac Enzymes Recent Labs Lab 11/10/16 1945 11/11/16 0244 11/11/16 0828  TROPONINI <0.03 <0.03 <0.03    Recent Labs Lab 11/10/16 1235 11/10/16 2003  TROPIPOC 0.00 0.00     BNP Recent Labs Lab 11/10/16 1727  BNP 63.9     Radiology    Dg Chest 2 View  Result Date: 11/10/2016 CLINICAL DATA:  Weakness, lightheadedness, cough EXAM: CHEST  2 VIEW COMPARISON:  12/09/2011 FINDINGS: Scarring in the left lung base. Right lung is clear. Heart is normal size. No effusions. Degenerative changes in the thoracic spine. No acute bony abnormality. IMPRESSION: Left basilar scarring.  No active disease. Electronically Signed   By: Rolm Baptise M.D.   On: 11/10/2016 13:04    Cardiac Studies   Nuc stress test pending.  Patient Profile     Lydia Campbell a 76 y.o.femalewith prior medical h/o obesity, hypertension, breast cancer post chemotherapy, pulmonary embolism in 2012 on chronic therapy with coumadin who presented with central chest tightness and upper back tightness.  She is on Coumadin for history of PE. She presented with similar chest pain in 10/2014. She underwent a cardiac cath for an abnomal stress  test with findings of mild non-obstructive CAD, normal LVEF.   Assessment & Plan    1. Chest pain  --Nuc stress today, neg Trop x 3, ECG unchanged from 2016 -- DOE, sounds typical, however mild nonobstructive CAD on cath in 2016 -- Was able to complete stress test, did well. No CP.  BP right after stress 124/57. -- CBG check right after Nuc Exercise Stress 95.  2. H/o PE in 2012 - on chronic coumadin, INR 1.86 today  3. CAD - on no BB as she is bradycardic  4. Hyperlipidemia - LDL is 110, need to consider statin. LFT's wnl.  Kristopher Glee, PA-C  11/11/2016, 10:29 AM    The patient was seen, examined and discussed with Delos Haring, PA-C and I agree with the above.   Lydia Campbell underwent nuclear stress test, I have reviewed her images and she has no significant ischemia, LVEF 63%. She has frequent PVCs on telemetry, she states that sometimes her SOB and blurry vision is associated with palpitations - approximately 1x/week. K is low, she is on Maxzide BID, we will decrease to daily, keep the same dose of K 20 mEq daily.  We will arrange for an outpatient 14 day event monitor. She can be discharged home today.   Ena Dawley, MD 11/11/2016

## 2016-11-11 NOTE — Discharge Summary (Signed)
Discharge Summary    Patient ID: Lydia Campbell,  MRN: [id number], DOB/AGE: December 30, 1940 76 y.o.  Admit date: 11/10/2016 Discharge date: 11/11/2016  Primary Care Provider: [facility] Primary Cardiologist: Dr. Angelena Form   Discharge Diagnoses    Active Problems:   Chest pain with moderate risk for cardiac etiology   Allergies Allergies  Allergen Reactions  . Morphine And Related Nausea Only       . Penicillins Nausea And Vomiting and Other (See Comments)    seizures & convulsions; passed out; last time was when pt was 20  Has patient had a PCN reaction causing immediate rash, facial/tongue/throat swelling, SOB or lightheadedness with hypotension: Yes Has patient had a PCN reaction causing severe rash involving mucus membranes or skin necrosis: No Has patient had a PCN reaction that required hospitalization: No Has patient had a PCN reaction occurring within the last 10 years: No If all of the above answers are "NO", then m  . Tape Other (See Comments)    PLEASE USE COBAN WRAP; TAPE WILL TEAR OFF THE PATENT'S SKIN!!     History of Present Illness     Lydia Campbell is a 76 year old female with a PMH of cardiac catheterization for an abnomal stress test with findings of mild non-obstructive CAD and normal LVEF (10/2014),  obesity, hypertension, breast cancer post chemotherapy, pulmonary embolism in 2012 on chronic therapy of Coumadin. She presented to the emergency department on 11/10/2016 with complaints of central chest tightness and upper back tightness that started while she was going up and down a flight of stairs. She has been having exertion associated shortness of breath and chest pain that has been aggressively getting worse over the past few weeks. She has had associated lightheadedness, blurry vision, palpitations and diaphoresis. She's also had a nonproductive cough.    Hospital Course     Consultants: None  We were asked to consult on patient after  nitroglycerin relieved her chest pain in the ER. Her symptoms were concerning for typical angina w/ dyspnea on exertion. Her cath in 2016 showed mild nonobstructive coronary artery disease, normal EF. She had unchanged EKG and negative troponins in the ER. The less invasive test exercise nuclear stress test was done. She had no abnormal drops in blood pressure or glucose.  Her nuclear exercise stress test came back low risk with an ejection fraction of 63%. Dr. Meda Coffee changed her Maxzide from BID to daily to try and alleviate her intermittent blurry vision, SOB and palpitations. She has not been prescribed a BB due to bradycardia. Will need to discuss possibly starting statin at f/u appt; LFTs are normal, LDL is 110. Her potassium supplement dose was left unchanged. Dr. Meda Coffee requests she have a 14 day event monitory with a subsequent follow-up appointment. BMP to be checked at f/u appointment. Per Dr. Meda Coffee the patient is clear for discharge today.   Discharge medications are listed below.  _____________  Discharge Vitals Blood pressure (!) 112/47, pulse 76, temperature 98.2 F (36.8 C), temperature source Oral, resp. rate 18, height 5\' 8"  (1.727 m), weight 221 lb 9 oz (100.5 kg), SpO2 98 %.  Filed Weights   11/10/16 2244 11/11/16 0429  Weight: 223 lb (101.2 kg) 221 lb 9 oz (100.5 kg)    Labs & Radiologic Studies     CBC  Recent Labs  11/10/16 1217 11/11/16 0244  WBC 8.6 7.5  HGB 13.9 12.7  HCT 41.1 37.4  MCV 96.9 96.9  PLT 239 401   Basic Metabolic Panel  Recent Labs  11/10/16 1217 11/10/16 1945 11/11/16 0244  NA 139  --  138  K 3.4*  --  3.4*  CL 106  --  106  CO2 22  --  21*  GLUCOSE 91  --  95  BUN 24*  --  24*  CREATININE 0.82 0.75 0.85  CALCIUM 9.4  --  8.9   Liver Function Tests  Recent Labs  11/11/16 0244  AST 24  ALT 17  ALKPHOS 58  BILITOT 0.5  PROT 5.9*  ALBUMIN 3.2*   Cardiac Enzymes  Recent Labs  11/10/16 1945 11/11/16 0244 11/11/16 0828    TROPONINI <0.03 <0.03 <0.03     Recent Labs  11/11/16 0244  CHOL 175  HDL 42  LDLCALC 110*  TRIG 116  CHOLHDL 4.2    Dg Chest 2 View  Result Date: 11/10/2016 CLINICAL DATA:  Weakness, lightheadedness, cough EXAM: CHEST  2 VIEW COMPARISON:  12/09/2011 FINDINGS: Scarring in the left lung base. Right lung is clear. Heart is normal size. No effusions. Degenerative changes in the thoracic spine. No acute bony abnormality. IMPRESSION: Left basilar scarring.  No active disease. Electronically Signed   By: Rolm Baptise M.D.   On: 11/10/2016 13:04     Diagnostic Studies/Procedures     MYOCARDIAL IMAGING WITH SPECT (REST AND EXERCISE) 11/11/2016  IMPRESSION: 1. No evidence of reversible ischemia. There is a moderate fixed defect in the basilar and mid ventricular septal wall.  2. Normal left ventricular wall motion.  3. Left ventricular ejection fraction 63%  4. Non invasive risk stratification*: Low   Transthoracic Echocardiography 10/22/2014  Study Conclusions  - Left ventricle: The cavity size was normal. Systolic function was normal. The estimated ejection fraction was in the range of 55% to 60%. Wall motion was normal; there were no regional wall motion abnormalities. - Mitral valve: There was trivial regurgitation. - Pulmonic valve: There was trivial regurgitation. _____________    Disposition   Pt is being discharged home today in good condition.  Follow-up Plans & Appointments    Follow-up Information    Ermalinda Barrios, PA-C Follow up on 12/01/2016.   Specialty:  Cardiology Why:  Your appointment is at 8:45 am, please arrive 15 minutes early. This is a follow-up appointment to discuss the results of your halter monitor. Selinda Eon is a PA that works with Dr. Meda Coffee.   Contact information: [address] [place] [address] [phone]          Discharge Instructions    Diet - low sodium heart healthy    Complete by:  As  directed    Increase activity slowly    Complete by:  As directed    May shower / Bathe    Complete by:  As directed       Discharge Medications   Allergies as of 11/11/2016      Reactions   Morphine And Related Nausea Only      Penicillins Nausea And Vomiting, Other (See Comments)   seizures & convulsions; passed out; last time was when pt was 20  Has patient had a PCN reaction causing immediate rash, facial/tongue/throat swelling, SOB or lightheadedness with hypotension: Yes Has patient had a PCN reaction causing severe rash involving mucus membranes or skin necrosis: No Has patient had a PCN reaction that required hospitalization: No Has patient had a PCN reaction occurring within the last 10 years: No If all of  the above answers are "NO", then m   Tape Other (See Comments)   PLEASE USE COBAN WRAP; TAPE WILL TEAR OFF THE PATENT'S SKIN!!      Medication List    TAKE these medications   acetaminophen 325 MG tablet Commonly known as:  TYLENOL Take 2 tablets (650 mg total) by mouth every 6 (six) hours as needed for fever (or Fever >/= 101). What changed:  reasons to take this   acyclovir 400 MG tablet Commonly known as:  ZOVIRAX Take 400 mg by mouth 3 (three) times daily as needed. FOR OUTBREAKS OF FEVER BLISTERS   allopurinol 300 MG tablet Commonly known as:  ZYLOPRIM Take 300 mg by mouth daily.   APPLE CIDER VINEGAR PO Take 5 mLs by mouth every morning.   B-COMPLEX PO Take 100 mg by mouth at bedtime.   benzonatate 100 MG capsule Commonly known as:  TESSALON Take 100 mg by mouth 3 (three) times daily as needed for cough.   Biotin 5000 MCG Caps Take 1 capsule by mouth daily.   budesonide 0.5 MG/2ML nebulizer solution Commonly known as:  PULMICORT Take 0.5 mg by nebulization 2 (two) times daily.   CALCIUM 500 1250 (500 Ca) MG tablet Generic drug:  calcium carbonate Take 1 tablet by mouth daily.   cetirizine 10 MG tablet Commonly known as:  ZYRTEC Take 10  mg by mouth daily.   cholecalciferol 400 units Tabs tablet Commonly known as:  VITAMIN D Take 400 Units by mouth daily.   dicyclomine 10 MG capsule Commonly known as:  BENTYL Take 1 capsule (10 mg total) by mouth 4 (four) times daily -  before meals and at bedtime. What changed:  when to take this  reasons to take this   fluticasone 50 MCG/ACT nasal spray Commonly known as:  FLONASE Place 1 spray into both nostrils 2 (two) times daily.   hydroxypropyl methylcellulose / hypromellose 2.5 % ophthalmic solution Commonly known as:  ISOPTO TEARS / GONIOVISC Place 1 drop into both eyes 4 (four) times daily as needed for dry eyes. Dry eyes   nabumetone 500 MG tablet Commonly known as:  RELAFEN Take 500 mg by mouth 2 (two) times daily as needed (pain).   NON FORMULARY Patient uses a CPAP machine   pantoprazole 40 MG tablet Commonly known as:  PROTONIX Take 40 mg by mouth daily before breakfast.   pimecrolimus 1 % cream Commonly known as:  ELIDEL Apply 1 application topically daily.   potassium chloride SA 20 MEQ tablet Commonly known as:  K-DUR,KLOR-CON Take 20 mEq by mouth 2 (two) times daily.   PROAIR HFA 108 (90 Base) MCG/ACT inhaler Generic drug:  albuterol Inhale 2 puffs into the lungs daily as needed for wheezing or shortness of breath.   SINUS RINSE Pack Place into the nose 2 (two) times daily.   triamterene-hydrochlorothiazide 75-50 MG tablet Commonly known as:  MAXZIDE Take 1 tablet by mouth daily. What changed:  when to take this   UNABLE TO FIND Rx: L8000- Post Surgical Bras (Quantity: 6) Dx: 174.9; Left Mastectomy   vitamin C 1000 MG tablet Take 1,000 mg by mouth daily.   warfarin 5 MG tablet Commonly known as:  COUMADIN Take 2.5-5 mg by mouth See admin instructions. 2.5 mg at bedtime on Sun/Tues/Thurs/Sat and 5 mg on Mon/Wed/Fri Notes to patient:  ONLY TOMORROW (Wednesday 4/25) take 5 mg of Warfarin. Then resume normal regimen.       Outstanding  Labs/Studies   BMP  Outpatient halter monitor ordered (14  Day course) Follow-up appointment scheduled for 3 weeks from discharge   Duration of Discharge Encounter   Greater than 30 minutes including physician time.  Kristopher Glee PA-C 11/11/2016, 4:03 PM

## 2016-11-11 NOTE — Care Management Obs Status (Signed)
[facility] NOTIFICATION   Patient Details  Name: Lydia Campbell MRN: [id number] Date of Birth: 1940-12-10   Medicare Observation Status Notification Given:  Yes    Dawayne Patricia, RN 11/11/2016, 4:20 PM

## 2016-11-11 NOTE — Progress Notes (Signed)
ANTICOAGULATION CONSULT NOTE - Follow Up Consult  Pharmacy Consult for Coumadin Indication: hx PE  Allergies  Allergen Reactions  . Morphine And Related Nausea Only       . Penicillins Nausea And Vomiting and Other (See Comments)    seizures & convulsions; passed out; last time was when pt was 20  Has patient had a PCN reaction causing immediate rash, facial/tongue/throat swelling, SOB or lightheadedness with hypotension: Yes Has patient had a PCN reaction causing severe rash involving mucus membranes or skin necrosis: No Has patient had a PCN reaction that required hospitalization: No Has patient had a PCN reaction occurring within the last 10 years: No If all of the above answers are "NO", then m  . Tape Other (See Comments)    PLEASE USE COBAN WRAP; TAPE WILL TEAR OFF THE PATENT'S SKIN!!    Patient Measurements: Height: 5\' 8"  (172.7 cm) Weight: 221 lb 9 oz (100.5 kg) IBW/kg (Calculated) : 63.9  Vital Signs: Temp: 98.2 F (36.8 C) (04/25 1242) Temp Source: Oral (04/25 1242) BP: 112/47 (04/25 1242) Pulse Rate: 76 (04/25 1242)  Labs:  Recent Labs  11/10/16 1217 11/10/16 1727 11/10/16 1945 11/11/16 0244 11/11/16 0828  HGB 13.9  --   --  12.7  --   HCT 41.1  --   --  37.4  --   PLT 239  --   --  206  --   LABPROT  --  22.2*  --   --  21.7*  INR  --  1.92  --   --  1.86  CREATININE 0.82  --  0.75 0.85  --   TROPONINI  --   --  <0.03 <0.03 <0.03    Estimated Creatinine Clearance: 73 mL/min (by C-G formula based on SCr of 0.85 mg/dL).  Assessment:  76 yr old female on Coumadin for hx PE.  Reports on Coumadin taking since 2009.  INR 1.92 on admit last night and down to 1.86 today.  Just below target range.  Coumadin dose not given last night. Discussed with patient. Coumadin 5 mg intended  last night but entered into CH[facility] ~10pm and defaulted to today at 6pm.  She did get Lovenox 40 mg sq last night.     Home regimen:  5 mg MWF, 2.5 mg TTSS.  Goal of Therapy:  INR  2-3 Monitor platelets by anticoagulation protocol: Yes   Plan:   Coumadin 5 mg today. Usual Wednesday dose.  Daily PT/INR.  Continue Lovenox 40 mg sq q24hrs at 2200.  Patient reports possible discharge today.  Suggested Coumadin 5 mg again tomorrow, rather than usual 2.5 mg on Thursday.  Arty Baumgartner, [place] Pager: [phone] 11/11/2016,2:34 PM

## 2016-11-11 NOTE — Discharge Instructions (Signed)

## 2016-11-11 NOTE — Progress Notes (Signed)
Order received to discharge patient.  Telemetry monitor applied and CCMD notified.  PIV access removed.  Discharge instructions, follow up, medications and instructions for their use were discussed with patient.

## 2016-11-17 ENCOUNTER — Ambulatory Visit: Payer: Medicare Other | Admitting: Physician Assistant

## 2016-11-18 ENCOUNTER — Other Ambulatory Visit: Payer: Self-pay | Admitting: Emergency Medicine

## 2016-11-18 ENCOUNTER — Ambulatory Visit (INDEPENDENT_AMBULATORY_CARE_PROVIDER_SITE_OTHER): Payer: Medicare Other

## 2016-11-18 DIAGNOSIS — R002 Palpitations: Secondary | ICD-10-CM | POA: Diagnosis not present

## 2016-11-18 DIAGNOSIS — R079 Chest pain, unspecified: Secondary | ICD-10-CM

## 2016-11-18 DIAGNOSIS — R42 Dizziness and giddiness: Secondary | ICD-10-CM

## 2016-11-18 DIAGNOSIS — R0609 Other forms of dyspnea: Principal | ICD-10-CM

## 2016-11-30 ENCOUNTER — Ambulatory Visit: Payer: Medicare Other

## 2016-12-01 ENCOUNTER — Ambulatory Visit: Payer: Medicare Other | Admitting: Cardiology

## 2016-12-01 ENCOUNTER — Ambulatory Visit
Admission: RE | Admit: 2016-12-01 | Discharge: 2016-12-01 | Disposition: A | Discharge status: Home / Self Care | Payer: Medicare Other | Source: Ambulatory Visit | Attending: General Surgery | Admitting: General Surgery

## 2016-12-01 DIAGNOSIS — Z1231 Encounter for screening mammogram for malignant neoplasm of breast: Secondary | ICD-10-CM

## 2016-12-11 ENCOUNTER — Telehealth: Payer: Self-pay | Admitting: Cardiology

## 2016-12-11 NOTE — Telephone Encounter (Signed)
Called and spoke with patient who was wondering if she still had to follow up with Lyda Jester on 12/16/2016 for her halter monitor. Advised patient that per Dr. Meda Coffee that patient still needed to follow up with someone about her halter monitor that was prescribed to her in the ER from 11/10/2016 since she was experiencing palpitations and chest pain. Pt stated she had to reschedule her appt because she was having cataract surgery on 12/16/16. Rescheduled appt for 12/29/2016 at 9 am with Lyda Jester. Pt thanked me for my call.

## 2016-12-11 NOTE — Telephone Encounter (Signed)
Patient calling, states that monitor results came out perfect and she would like to know if she still needs to come in to see [facility].

## 2016-12-16 ENCOUNTER — Ambulatory Visit: Payer: Medicare Other | Admitting: Cardiology

## 2016-12-29 ENCOUNTER — Encounter: Payer: Self-pay | Admitting: Cardiology

## 2016-12-29 ENCOUNTER — Ambulatory Visit (INDEPENDENT_AMBULATORY_CARE_PROVIDER_SITE_OTHER): Payer: Medicare Other | Admitting: Cardiology

## 2016-12-29 VITALS — BP 110/76 | HR 60 | Ht 68.0 in | Wt 232.0 lb

## 2016-12-29 DIAGNOSIS — E876 Hypokalemia: Secondary | ICD-10-CM | POA: Diagnosis not present

## 2016-12-29 DIAGNOSIS — R0789 Other chest pain: Secondary | ICD-10-CM

## 2016-12-29 LAB — BASIC METABOLIC PANEL
BUN/Creatinine Ratio: 29 — ABNORMAL HIGH (ref 12–28)
BUN: 23 mg/dL (ref 8–27)
CALCIUM: 9.6 mg/dL (ref 8.7–10.3)
CHLORIDE: 103 mmol/L (ref 96–106)
CO2: 22 mmol/L (ref 20–29)
Creatinine, Ser: 0.8 mg/dL (ref 0.57–1.00)
GFR calc Af Amer: 84 mL/min/{1.73_m2} (ref >59)
GFR calc non Af Amer: 73 mL/min/{1.73_m2} (ref >59)
Glucose: 75 mg/dL (ref 65–99)
Potassium: 4.1 mmol/L (ref 3.5–5.2)
Sodium: 142 mmol/L (ref 134–144)

## 2016-12-29 MED ORDER — TRIAMTERENE-HCTZ 75-50 MG PO TABS: 1.0000 | ORAL_TABLET | Freq: Two times a day (BID) | ORAL | 3 refills | Status: DC

## 2016-12-29 NOTE — Patient Instructions (Signed)
Medication Instructions:  Your physician recommends that you continue on your current medications as directed. Please refer to the Current Medication list given to you today.   Labwork: Your physician recommends that you return for lab work today for BMET  Testing/Procedures: None Ordered   Follow-Up: Your physician wants you to follow-up in: 1 year with Dr. Johann Capers will receive a reminder letter in the mail two months in advance. If you don't receive a letter, please call our office to schedule the follow-up appointment.   Any Other Special Instructions Will Be Listed Below (If Applicable).     If you need a refill on your cardiac medications before your next appointment, please call your pharmacy.  Thank you for choosing [facility]

## 2016-12-29 NOTE — Progress Notes (Signed)
12/29/2016 Lydia Campbell   01-22-1941  [id number]  Primary Physician Galen Manila, MD Primary Cardiologist: Dr. Meda Coffee   Reason for Visit/CC: [facility] f/u for Chest Pain   HPI:  Lydia Campbell is a 76 y.o. female who is being seen today for post hospital follow-up. She has a history of cardiac catheterization due to an abnormal stress test with findings of mild nonobstructive CAD with normal EF in April 2016, as well as a history of hypertension, obesity, breast cancer treated with chemotherapy as well as a prior history of pulmonary embolism in 2012, on chronic therapy with Coumadin. She recently presented to [facility] on 11/11/2006 with complaints of chest tightness, upper back pain and palpitations. This occurred while she was cleaning out her. She reports that this was very dusty, with poor ventilation.  Given her symptoms she was admitted to the hospital for further workup. Cardiac enzymes were cycled and were negative ruling out myocardial infarction. She had no EKG changes. She underwent nuclear stress testing which was negative for ischemia and ejection fraction was normal at 63%. She had no cardiac arrhythmias during inpatient monitoring. It was recommended at time of discharge for the patient to be monitored with an outpatient cardiac monitor. This was completed and was unremarkable. She had no arrhythmias.   She presents to clinic today for follow-up. She denies any recurrent chest or back pain. In retrospect, the patient believes that her symptoms were due to a flare of her asthma given the symptoms occurred while she was cleaning out her attic which was very dusty with poor ventilation. She also admits that she had not been  fully compliant with her asthma inhalers. Since then she has improved daily compliance and denies any recurrent symptoms.  In addition, it was outlined in her discharge summary to repeat laboratory work, BMP, to recheck potassium levels  as she was mildly hypokalemic with potassium of 3.4    Current Meds  Medication Sig  . acetaminophen (TYLENOL) 325 MG tablet Take 650 mg by mouth every 6 (six) hours as needed for mild pain.  Marland Kitchen acyclovir (ZOVIRAX) 400 MG tablet Take 400 mg by mouth 3 (three) times daily as needed. FOR OUTBREAKS OF FEVER BLISTERS  . albuterol (PROAIR HFA) 108 (90 Base) MCG/ACT inhaler Inhale 2 puffs into the lungs daily as needed for wheezing or shortness of breath.  . allopurinol (ZYLOPRIM) 300 MG tablet Take 300 mg by mouth daily.   . APPLE CIDER VINEGAR PO Take 5 mLs by mouth every morning.  . Ascorbic Acid (VITAMIN C) 1000 MG tablet Take 1,000 mg by mouth daily.   . B Complex-Biotin-FA (B-COMPLEX PO) Take 100 mg by mouth at bedtime.   . benzonatate (TESSALON) 100 MG capsule Take 100 mg by mouth 3 (three) times daily as needed for cough.   . Biotin 5000 MCG CAPS Take 1 capsule by mouth daily.   . budesonide (PULMICORT) 0.5 MG/2ML nebulizer solution Take 0.5 mg by nebulization 2 (two) times daily.  . calcium carbonate (CALCIUM 500) 1250 MG tablet Take 1 tablet by mouth daily.  . cetirizine (ZYRTEC) 10 MG tablet Take 10 mg by mouth daily.   . cholecalciferol (VITAMIN D) 400 UNITS TABS Take 400 Units by mouth daily.   Marland Kitchen dicyclomine (BENTYL) 10 MG capsule Take 10 mg by mouth 4 (four) times daily as needed for spasms.  . fluticasone (FLONASE) 50 MCG/ACT nasal spray Place 1 spray into both nostrils 2 (two) times daily.  Marland Kitchen  hydroxypropyl methylcellulose (ISOPTO TEARS) 2.5 % ophthalmic solution Place 1 drop into both eyes 4 (four) times daily as needed for dry eyes. Dry eyes  . Hypertonic Nasal Wash (SINUS RINSE) PACK Place into the nose 2 (two) times daily.   . nabumetone (RELAFEN) 500 MG tablet Take 500 mg by mouth 2 (two) times daily as needed (pain).   . NON FORMULARY Patient uses a CPAP machine  . pantoprazole (PROTONIX) 40 MG tablet Take 40 mg by mouth daily before breakfast.  . pimecrolimus (ELIDEL) 1 %  cream Apply 1 application topically daily.  . potassium chloride SA (K-DUR,KLOR-CON) 20 MEQ tablet Take 20 mEq by mouth 2 (two) times daily.  Marland Kitchen triamterene-hydrochlorothiazide (MAXZIDE) 75-50 MG tablet Take 1 tablet by mouth daily.  Marland Kitchen UNABLE TO FIND Rx: L8000- Post Surgical Bras (Quantity: 6) Dx: 174.9; Left Mastectomy  . warfarin (COUMADIN) 5 MG tablet Take 2.5-5 mg by mouth See admin instructions. 2.5 mg at bedtime on Sun/Tues/Thurs/Sat and 5 mg on Mon/Wed/Fri  . [DISCONTINUED] dicyclomine (BENTYL) 10 MG capsule Take 1 capsule (10 mg total) by mouth 4 (four) times daily -  before meals and at bedtime. (Patient taking differently: Take 10 mg by mouth 4 (four) times daily as needed for spasms. )   Allergies  Allergen Reactions  . Morphine And Related Nausea Only       . Penicillins Nausea And Vomiting and Other (See Comments)    seizures & convulsions; passed out; last time was when pt was 20  Has patient had a PCN reaction causing immediate rash, facial/tongue/throat swelling, SOB or lightheadedness with hypotension: Yes Has patient had a PCN reaction causing severe rash involving mucus membranes or skin necrosis: No Has patient had a PCN reaction that required hospitalization: No Has patient had a PCN reaction occurring within the last 10 years: No If all of the above answers are "NO", then m  . Tape Other (See Comments)    PLEASE USE COBAN WRAP; TAPE WILL TEAR OFF THE PATENT'S SKIN!!   Past Medical History:  Diagnosis Date  . Anxiety   . Asthma   . Blood transfusion 07/2008   "related to chemo" (07/25/2012)  . Breast cancer ([place])    breast lft  . Chronic lower back pain   . Clotting disorder ([place])    "sudden chest pain; found multiple blood clots in both lungs; they think it was from Tamoxifen" (07/25/2012)  . Exertional dyspnea   . Fibromyalgia   . GERD (gastroesophageal reflux disease)   . Gout   . H/O hiatal hernia   . Heart murmur   . Hx of blood clots 02/2011   "sudden  chest pain; found multiple blood clots in both lungs; they think it was from Tamoxifen; haven't had any since 11/2011" (07/25/2012)  . Hypertension   . Left knee DJD   . OSA on CPAP   . Osteoarthritis    "pretty much everywhere" (07/25/2012)  . Pulmonary embolism ([place]) 02/2011  . Right knee DJD   . Seizures ([place])    "seizures vs convulsions related to PCN when I was a child" (07/25/2012)  . Subcutaneous hematoma 07/25/2012   Intraoperative cultures are pending on this fluid collection that was just outside the left knee joint.  Gram stain had few WBC monos and polys.   Family History  Problem Relation Age of Onset  . Heart disease Mother   . Diabetes Father   . Heart disease Father   . CVA Father   .  Colon polyps Sister        adenomatous  . Heart disease Sister   . Heart disease Brother   . Breast cancer Cousin        pat side, mat and pat aunts  . Colon cancer Neg Hx    Past Surgical History:  Procedure Laterality Date  . BREAST BIOPSY Left 03/05/2008   malignant  . BREAST LUMPECTOMY  02/2008   "left" (07/25/2012)  . BREAST SURGERY    . KNEE ARTHROSCOPY  2001;  2009   "right; left" (07/25/2012)  . LEFT HEART CATHETERIZATION WITH CORONARY ANGIOGRAM N/A 10/26/2014   Procedure: LEFT HEART CATHETERIZATION WITH CORONARY ANGIOGRAM;  Surgeon: Burnell Blanks, MD;  [facility] LAB;  Service: Cardiovascular;  Laterality: N/A;  . MASTECTOMY MODIFIED RADICAL / SIMPLE / COMPLETE Left 03/28/2008   "left" (07/25/2012)  . PORTACATH PLACEMENT  03/28/2008; ?05/2008   "left; right" (07/25/2012)  . TONSILLECTOMY  1950  . TOTAL KNEE ARTHROPLASTY  12/21/2011   Procedure: TOTAL KNEE ARTHROPLASTY;  Surgeon: Lorn Junes, MD;  Location: [place];  Service: Orthopedics;  Laterality: Right;  DR [place] THIS CASE  . TOTAL KNEE ARTHROPLASTY WITH REVISION COMPONENTS  07/25/2012   Procedure: TOTAL KNEE ARTHROPLASTY WITH REVISION COMPONENTS;  Surgeon: Lorn Junes, MD;  Location: [place];   Service: Orthopedics;  Laterality: Left;  . TUBAL LIGATION  ~ 1976  . VAGINAL HYSTERECTOMY  ~ 1983   "endometrosis" (07/25/2012)   Social History   Social History  . Marital status: Married    Spouse name: N/A  . Number of children: 1  . Years of education: N/A   Occupational History  . retired    Social History Main Topics  . Smoking status: Former Smoker    Packs/day: 1.50    Years: 5.00    Types: Cigarettes    Quit date: 12/06/1968  . Smokeless tobacco: Never Used  . Alcohol use No     Comment: 07/25/2012 "bottle of champaign on our anniversary q yr; add a drink once in a blue moon for a celebration"  . Drug use: No  . Sexual activity: Yes    Partners: Male   Other Topics Concern  . Not on file   Social History Narrative  . No narrative on file     Review of Systems: General: negative for chills, fever, night sweats or weight changes.  Cardiovascular: negative for chest pain, dyspnea on exertion, edema, orthopnea, palpitations, paroxysmal nocturnal dyspnea or shortness of breath Dermatological: negative for rash Respiratory: negative for cough or wheezing Urologic: negative for hematuria Abdominal: negative for nausea, vomiting, diarrhea, bright red blood per rectum, melena, or hematemesis Neurologic: negative for visual changes, syncope, or dizziness All other systems reviewed and are otherwise negative except as noted above.   Physical Exam:  Blood pressure 110/76, pulse 60, height 5\' 8"  (1.727 m), weight 232 lb (105.2 kg).  General appearance: alert, cooperative and no distress Neck: no carotid bruit and no JVD Lungs: clear to auscultation bilaterally Heart: regular rate and rhythm, S1, S2 normal, no murmur, click, rub or gallop Extremities: extremities normal, atraumatic, no cyanosis or edema Pulses: 2+ and symmetric Skin: Skin color, texture, turgor normal. No rashes or lesions Neurologic: Grossly normal  EKG NSR 60 bpm, no ischemia -- personally reviewed     ASSESSMENT AND PLAN:   1. Chest Pain: negative ischemic w/u including normal EKGs, negative cardiac enzymes and low risk NST with normal LVEF.  No arrhythmias with inpatient and outpatient cardiac monitoring. In retrospect, the patient believes her symptoms were secondary to a mild asthma exacerbation. She has improved compliance with her inhalers and denies any recurrent symptoms. No further cardiac workup indicated    2. Mild nonobstructive CAD: She has a history of cardiac catheterization with findings of mild nonobstructive CAD with normal EF in April 2016. Recent ischemic workup was negative. Low risk nuclear stress test. She denies any anginal symptoms. Continue medical therapy for secondary prevention.  3. Hypertension: Controlled on current regimen.  4. Hypokalemia: Patient had issues with mild hypokalemia during her hospitalization. She is on daily Maxide for hypertension/ edema. Recheck a BMP today to ensure that potassium levels are within normal limits.   Follow-Up w/ Dr. Meda Coffee in 1 year   Lydia Campbell, MHS [facility] 12/29/2016 9:15 AM

## 2016-12-30 ENCOUNTER — Encounter: Payer: Self-pay | Admitting: Cardiology

## 2016-12-30 NOTE — Telephone Encounter (Signed)
New message    Pt is calling for results of lab work. Returning call from [place].

## 2016-12-30 NOTE — Telephone Encounter (Signed)
This encounter was created in error - please disregard.

## 2017-01-06 ENCOUNTER — Telehealth: Payer: Self-pay | Admitting: *Deleted

## 2017-01-06 ENCOUNTER — Other Ambulatory Visit: Payer: Self-pay | Admitting: *Deleted

## 2017-01-06 MED ORDER — TRIAMTERENE-HCTZ 75-50 MG PO TABS: 1.0000 | ORAL_TABLET | Freq: Every day | ORAL | 3 refills | Status: DC

## 2017-01-06 NOTE — Telephone Encounter (Signed)
Patient was seen by Lyda Jester, PA in the office on 12/29/16 and her triamterene/hctz 75-50 mg was ordered incorrectly as BID. I received a high dose clarification fax from [place]. This was clarified and signed by Dr Meda Coffee and will be scanned into the patients chart. I have faxed the order back to [facility] and received confirmation that this was received. Patients med list updated to reflect patients correct dose of 75-50 mg qd.

## 2017-01-06 NOTE — Telephone Encounter (Signed)
Patient was seen by Lyda Jester, PA in the office on 12/29/16 and her triamterene/hctz 75-50 mg was ordered incorrectly as BID. I received a high dose clarification fax from [place]. This was clarified and signed by Dr Meda Coffee and will be scanned into the patients chart. I have faxed the order back to [facility], [facility] and received confirmation that this was received. Patients med list updated to reflect patients correct dose of 75-50 mg qd.

## 2017-02-03 ENCOUNTER — Encounter: Payer: Self-pay | Admitting: Cardiology

## 2017-02-03 ENCOUNTER — Ambulatory Visit (INDEPENDENT_AMBULATORY_CARE_PROVIDER_SITE_OTHER): Payer: Medicare Other | Admitting: Cardiology

## 2017-02-03 VITALS — BP 130/64 | HR 64 | Ht 68.0 in | Wt 230.0 lb

## 2017-02-03 DIAGNOSIS — E876 Hypokalemia: Secondary | ICD-10-CM | POA: Diagnosis not present

## 2017-02-03 DIAGNOSIS — R0789 Other chest pain: Secondary | ICD-10-CM

## 2017-02-03 DIAGNOSIS — R600 Localized edema: Secondary | ICD-10-CM | POA: Diagnosis not present

## 2017-02-03 DIAGNOSIS — R079 Chest pain, unspecified: Secondary | ICD-10-CM | POA: Diagnosis not present

## 2017-02-03 DIAGNOSIS — I5033 Acute on chronic diastolic (congestive) heart failure: Secondary | ICD-10-CM | POA: Diagnosis not present

## 2017-02-03 DIAGNOSIS — E782 Mixed hyperlipidemia: Secondary | ICD-10-CM

## 2017-02-03 NOTE — Patient Instructions (Signed)
Medication Instructions:   Your physician recommends that you continue on your current medications as directed. Please refer to the Current Medication list given to you today.     Follow-Up:  Your physician wants you to follow-up in: [place] will receive a reminder letter in the mail two months in advance. If you don't receive a letter, please call our office to schedule the follow-up appointment.        If you need a refill on your cardiac medications before your next appointment, please call your pharmacy.

## 2017-02-03 NOTE — Progress Notes (Signed)
02/03/2017 Lydia Campbell   08-07-1940  [id number]  Primary Physician Galen Manila, MD Primary Cardiologist: Dr. Meda Coffee   Reason for Visit/CC: 3 months follow up  HPI:  Lydia Campbell is a 76 y.o. female with h/o cardiac catheterization due to an abnormal stress test with findings of mild nonobstructive CAD with normal EF in April 2016, as well as a history of hypertension, obesity, breast cancer treated with chemotherapy as well as a prior history of pulmonary embolism in 2012, on chronic therapy with Coumadin. She recently presented to [facility] on 11/11/2006 with complaints of chest tightness, upper back pain and palpitations. This occurred while she was cleaning out her. She reports that this was very dusty, with poor ventilation.  Given her symptoms she was admitted to the hospital for further workup. Cardiac enzymes were cycled and were negative ruling out myocardial infarction. She had no EKG changes. She underwent nuclear stress testing which was negative for ischemia and ejection fraction was normal at 63%. She had no cardiac arrhythmias during inpatient monitoring. It was recommended at time of discharge for the patient to be monitored with an outpatient cardiac monitor. This was completed and was unremarkable. She had no arrhythmias.   She presents to clinic today for follow-up. She denies any recurrent chest or back pain. In retrospect, the patient believes that her symptoms were due to a flare of her asthma given the symptoms occurred while she was cleaning out her attic which was very dusty with poor ventilation. She also admits that she had not been  fully compliant with her asthma inhalers. Since then she has improved daily compliance and denies any recurrent symptoms.  02/03/2017 - this is a 3 months follow-up, the patient states that she feels significantly better. She has started to use her inhaled steroid twice a day with significant improvement in  shortness of breath. She is able to walk early in the morning and late at night with her husband. She denies any chest pain shortness of breath no claudications dizziness or syncope. She has chronic lower extremity edema that is worse in summer. She has been compliant with her medications and denies any bleeding.  Current Meds  Medication Sig  . acetaminophen (TYLENOL) 325 MG tablet Take 650 mg by mouth every 6 (six) hours as needed for mild pain.  Marland Kitchen acyclovir (ZOVIRAX) 400 MG tablet Take 400 mg by mouth 3 (three) times daily as needed. FOR OUTBREAKS OF FEVER BLISTERS  . albuterol (PROAIR HFA) 108 (90 Base) MCG/ACT inhaler Inhale 2 puffs into the lungs daily as needed for wheezing or shortness of breath.  . allopurinol (ZYLOPRIM) 300 MG tablet Take 300 mg by mouth daily.   . APPLE CIDER VINEGAR PO Take 5 mLs by mouth every morning.  . Ascorbic Acid (VITAMIN C) 1000 MG tablet Take 1,000 mg by mouth daily.   . B Complex-Biotin-FA (B-COMPLEX PO) Take 100 mg by mouth at bedtime.   . benzonatate (TESSALON) 100 MG capsule Take 100 mg by mouth 3 (three) times daily as needed for cough.   . Biotin 5000 MCG CAPS Take 1 capsule by mouth daily.   . budesonide (PULMICORT) 0.5 MG/2ML nebulizer solution Take 0.5 mg by nebulization 2 (two) times daily.  . calcium carbonate (CALCIUM 500) 1250 MG tablet Take 1 tablet by mouth daily.  . cetirizine (ZYRTEC) 10 MG tablet Take 10 mg by mouth daily.   . cholecalciferol (VITAMIN D) 400 UNITS TABS Take 400 Units by  mouth daily.   Marland Kitchen dicyclomine (BENTYL) 10 MG capsule Take 10 mg by mouth 4 (four) times daily as needed for spasms.  . fluticasone (FLONASE) 50 MCG/ACT nasal spray Place 1 spray into both nostrils 2 (two) times daily.  . hydroxypropyl methylcellulose (ISOPTO TEARS) 2.5 % ophthalmic solution Place 1 drop into both eyes 4 (four) times daily as needed for dry eyes. Dry eyes  . Hypertonic Nasal Wash (SINUS RINSE) PACK Place into the nose 2 (two) times daily.   .  nabumetone (RELAFEN) 500 MG tablet Take 500 mg by mouth 2 (two) times daily as needed (pain).   . NON FORMULARY Patient uses a CPAP machine  . pantoprazole (PROTONIX) 40 MG tablet Take 40 mg by mouth daily before breakfast.  . pimecrolimus (ELIDEL) 1 % cream Apply 1 application topically daily.  . potassium chloride SA (K-DUR,KLOR-CON) 20 MEQ tablet Take 20 mEq by mouth 2 (two) times daily.  Marland Kitchen triamterene-hydrochlorothiazide (MAXZIDE) 75-50 MG tablet Take 1 tablet by mouth daily.  Marland Kitchen UNABLE TO FIND Rx: L8000- Post Surgical Bras (Quantity: 6) Dx: 174.9; Left Mastectomy  . warfarin (COUMADIN) 5 MG tablet Take 2.5-5 mg by mouth See admin instructions. 2.5 mg at bedtime on Sun/Tues/Thurs/Sat and 5 mg on Mon/Wed/Fri   Allergies  Allergen Reactions  . Morphine And Related Nausea Only       . Penicillins Nausea And Vomiting and Other (See Comments)    seizures & convulsions; passed out; last time was when pt was 20  Has patient had a PCN reaction causing immediate rash, facial/tongue/throat swelling, SOB or lightheadedness with hypotension: Yes Has patient had a PCN reaction causing severe rash involving mucus membranes or skin necrosis: No Has patient had a PCN reaction that required hospitalization: No Has patient had a PCN reaction occurring within the last 10 years: No If all of the above answers are "NO", then m  . Tape Other (See Comments)    PLEASE USE COBAN WRAP; TAPE WILL TEAR OFF THE PATENT'S SKIN!!   Past Medical History:  Diagnosis Date  . Anxiety   . Asthma   . Blood transfusion 07/2008   "related to chemo" (07/25/2012)  . Breast cancer ([place])    breast lft  . Chronic lower back pain   . Clotting disorder ([place])    "sudden chest pain; found multiple blood clots in both lungs; they think it was from Tamoxifen" (07/25/2012)  . Exertional dyspnea   . Fibromyalgia   . GERD (gastroesophageal reflux disease)   . Gout   . H/O hiatal hernia   . Heart murmur   . Hx of blood clots  02/2011   "sudden chest pain; found multiple blood clots in both lungs; they think it was from Tamoxifen; haven't had any since 11/2011" (07/25/2012)  . Hypertension   . Left knee DJD   . OSA on CPAP   . Osteoarthritis    "pretty much everywhere" (07/25/2012)  . Pulmonary embolism ([place]) 02/2011  . Right knee DJD   . Seizures ([place])    "seizures vs convulsions related to PCN when I was a child" (07/25/2012)  . Subcutaneous hematoma 07/25/2012   Intraoperative cultures are pending on this fluid collection that was just outside the left knee joint.  Gram stain had few WBC monos and polys.   Family History  Problem Relation Age of Onset  . Heart disease Mother   . Diabetes Father   . Heart disease Father   . CVA Father   .  Colon polyps Sister        adenomatous  . Heart disease Sister   . Heart disease Brother   . Breast cancer Cousin        pat side, mat and pat aunts  . Colon cancer Neg Hx    Past Surgical History:  Procedure Laterality Date  . BREAST BIOPSY Left 03/05/2008   malignant  . BREAST LUMPECTOMY  02/2008   "left" (07/25/2012)  . BREAST SURGERY    . KNEE ARTHROSCOPY  2001;  2009   "right; left" (07/25/2012)  . LEFT HEART CATHETERIZATION WITH CORONARY ANGIOGRAM N/A 10/26/2014   Procedure: LEFT HEART CATHETERIZATION WITH CORONARY ANGIOGRAM;  Surgeon: Burnell Blanks, MD;  Location: [facility] CATH LAB;  Service: Cardiovascular;  Laterality: N/A;  . MASTECTOMY MODIFIED RADICAL / SIMPLE / COMPLETE Left 03/28/2008   "left" (07/25/2012)  . PORTACATH PLACEMENT  03/28/2008; ?05/2008   "left; right" (07/25/2012)  . TONSILLECTOMY  1950  . TOTAL KNEE ARTHROPLASTY  12/21/2011   Procedure: TOTAL KNEE ARTHROPLASTY;  Surgeon: Lorn Junes, MD;  Location: [place];  Service: Orthopedics;  Laterality: Right;  DR [place] THIS CASE  . TOTAL KNEE ARTHROPLASTY WITH REVISION COMPONENTS  07/25/2012   Procedure: TOTAL KNEE ARTHROPLASTY WITH REVISION COMPONENTS;  Surgeon: Lorn Junes, MD;   Location: [place];  Service: Orthopedics;  Laterality: Left;  . TUBAL LIGATION  ~ 1976  . VAGINAL HYSTERECTOMY  ~ 1983   "endometrosis" (07/25/2012)   Social History   Social History  . Marital status: Married    Spouse name: N/A  . Number of children: 1  . Years of education: N/A   Occupational History  . retired    Social History Main Topics  . Smoking status: Former Smoker    Packs/day: 1.50    Years: 5.00    Types: Cigarettes    Quit date: 12/06/1968  . Smokeless tobacco: Never Used  . Alcohol use No     Comment: 07/25/2012 "bottle of champaign on our anniversary q yr; add a drink once in a blue moon for a celebration"  . Drug use: No  . Sexual activity: Yes    Partners: Male   Other Topics Concern  . Not on file   Social History Narrative  . No narrative on file     Review of Systems: General: negative for chills, fever, night sweats or weight changes.  Cardiovascular: negative for chest pain, dyspnea on exertion, edema, orthopnea, palpitations, paroxysmal nocturnal dyspnea or shortness of breath Dermatological: negative for rash Respiratory: negative for cough or wheezing Urologic: negative for hematuria Abdominal: negative for nausea, vomiting, diarrhea, bright red blood per rectum, melena, or hematemesis Neurologic: negative for visual changes, syncope, or dizziness All other systems reviewed and are otherwise negative except as noted above.   Physical Exam:  Blood pressure 130/64, pulse 64, height 5\' 8"  (1.727 m), weight 230 lb (104.3 kg), SpO2 97 %.  General appearance: alert, cooperative and no distress Neck: no carotid bruit and no JVD Lungs: clear to auscultation bilaterally Heart: regular rate and rhythm, S1, S2 normal, no murmur, click, rub or gallop Extremities: extremities normal, atraumatic, no cyanosis or edema Pulses: 2+ and symmetric Skin: Skin color, texture, turgor normal. No rashes or lesions Neurologic: Grossly normal  EKG NSR 60 bpm, no  ischemia -- personally reviewed   ASSESSMENT AND PLAN:   1. Chest Pain: negative ischemic w/u including normal EKGs, negative cardiac enzymes and low risk NST with  normal LVEF. No arrhythmias with inpatient and outpatient cardiac monitoring. In retrospect, the patient believes her symptoms were secondary to a mild asthma exacerbation. She has improved compliance with her inhalers and denies any recurrent symptoms. No further cardiac workup indicated. The patient is encouraged to continue exercising for short period in the morning and evenings.   2. Mild nonobstructive CAD: She has a history of cardiac catheterization with findings of mild nonobstructive CAD with normal EF in April 2016. Recent ischemic workup was negative. Low risk nuclear stress test. She denies any anginal symptoms. Continue medical therapy for secondary prevention.  3. Hypertension: Controlled on no medication.  4. Chronic lower extremity edema - that looks rather like genetic predisposition with puffiness no significant edema, she is advised about low sodium diet and use of compression stockings.  5. History of pulmonary embolism on chronic Coumadin therapy, she is being followed by home INR machine and her primary care physician, her most recent INR was 2.6. She has no bleeding, most recent hemoglobin in April 2000 1812.7.   Follow-Up w/ Dr. Meda Coffee in 6 months.  Ena Dawley , MD [facility] 02/03/2017 9:52 AM

## 2017-04-08 ENCOUNTER — Other Ambulatory Visit: Payer: Self-pay | Admitting: Orthopedic Surgery

## 2017-04-08 DIAGNOSIS — M25551 Pain in right hip: Secondary | ICD-10-CM

## 2017-04-20 ENCOUNTER — Other Ambulatory Visit: Payer: Medicare Other

## 2017-11-19 ENCOUNTER — Other Ambulatory Visit: Payer: Self-pay | Admitting: General Surgery

## 2017-11-19 DIAGNOSIS — Z1231 Encounter for screening mammogram for malignant neoplasm of breast: Secondary | ICD-10-CM

## 2017-12-09 ENCOUNTER — Ambulatory Visit
Admission: RE | Admit: 2017-12-09 | Discharge: 2017-12-09 | Disposition: A | Discharge status: Home / Self Care | Payer: Medicare Other | Source: Ambulatory Visit | Attending: General Surgery | Admitting: General Surgery

## 2017-12-09 DIAGNOSIS — Z1231 Encounter for screening mammogram for malignant neoplasm of breast: Secondary | ICD-10-CM

## 2018-06-02 ENCOUNTER — Emergency Department (HOSPITAL_COMMUNITY)
Admission: EM | Admit: 2018-06-02 | Discharge: 2018-06-02 | Disposition: A | Discharge status: Home / Self Care | Payer: Medicare Other | Attending: Emergency Medicine | Admitting: Emergency Medicine

## 2018-06-02 ENCOUNTER — Encounter (HOSPITAL_COMMUNITY): Payer: Self-pay | Admitting: Emergency Medicine

## 2018-06-02 ENCOUNTER — Other Ambulatory Visit: Payer: Self-pay

## 2018-06-02 ENCOUNTER — Emergency Department (HOSPITAL_COMMUNITY): Payer: Medicare Other

## 2018-06-02 DIAGNOSIS — Z79899 Other long term (current) drug therapy: Secondary | ICD-10-CM | POA: Diagnosis not present

## 2018-06-02 DIAGNOSIS — R110 Nausea: Secondary | ICD-10-CM | POA: Insufficient documentation

## 2018-06-02 DIAGNOSIS — Z853 Personal history of malignant neoplasm of breast: Secondary | ICD-10-CM | POA: Insufficient documentation

## 2018-06-02 DIAGNOSIS — Z87891 Personal history of nicotine dependence: Secondary | ICD-10-CM | POA: Diagnosis not present

## 2018-06-02 DIAGNOSIS — Z86711 Personal history of pulmonary embolism: Secondary | ICD-10-CM | POA: Insufficient documentation

## 2018-06-02 DIAGNOSIS — M545 Low back pain: Secondary | ICD-10-CM | POA: Diagnosis present

## 2018-06-02 DIAGNOSIS — M5431 Sciatica, right side: Secondary | ICD-10-CM | POA: Diagnosis not present

## 2018-06-02 DIAGNOSIS — R42 Dizziness and giddiness: Secondary | ICD-10-CM | POA: Insufficient documentation

## 2018-06-02 LAB — URINALYSIS, ROUTINE W REFLEX MICROSCOPIC
Bilirubin Urine: NEGATIVE
Glucose, UA: NEGATIVE mg/dL
Hgb urine dipstick: NEGATIVE
Ketones, ur: NEGATIVE mg/dL
Leukocytes, UA: NEGATIVE
Nitrite: NEGATIVE
Protein, ur: NEGATIVE mg/dL
Specific Gravity, Urine: 1.017 (ref 1.005–1.030)
pH: 5 (ref 5.0–8.0)

## 2018-06-02 LAB — CBC WITH DIFFERENTIAL/PLATELET
Abs Immature Granulocytes: 0.07 10*3/uL (ref 0.00–0.07)
Basophils Absolute: 0 10*3/uL (ref 0.0–0.1)
Basophils Relative: 0 %
Eosinophils Absolute: 0 10*3/uL (ref 0.0–0.5)
Eosinophils Relative: 0 %
HCT: 37.3 % (ref 36.0–46.0)
Hemoglobin: 12.1 g/dL (ref 12.0–15.0)
Immature Granulocytes: 1 %
Lymphocytes Relative: 3 %
Lymphs Abs: 0.3 10*3/uL — ABNORMAL LOW (ref 0.7–4.0)
MCH: 33.2 pg (ref 26.0–34.0)
MCHC: 32.4 g/dL (ref 30.0–36.0)
MCV: 102.5 fL — ABNORMAL HIGH (ref 80.0–100.0)
Monocytes Absolute: 0.2 10*3/uL (ref 0.1–1.0)
Monocytes Relative: 2 %
Neutro Abs: 12.4 10*3/uL — ABNORMAL HIGH (ref 1.7–7.7)
Neutrophils Relative %: 94 %
Platelets: 272 10*3/uL (ref 150–400)
RBC: 3.64 MIL/uL — ABNORMAL LOW (ref 3.87–5.11)
RDW: 13.6 % (ref 11.5–15.5)
WBC: 13 10*3/uL — ABNORMAL HIGH (ref 4.0–10.5)
nRBC: 0 % (ref 0.0–0.2)

## 2018-06-02 LAB — PROTIME-INR
INR: 2.57
Prothrombin Time: 27.2 seconds — ABNORMAL HIGH (ref 11.4–15.2)

## 2018-06-02 LAB — BASIC METABOLIC PANEL
Anion gap: 10 (ref 5–15)
BUN: 21 mg/dL (ref 8–23)
CO2: 23 mmol/L (ref 22–32)
Calcium: 8.9 mg/dL (ref 8.9–10.3)
Chloride: 105 mmol/L (ref 98–111)
Creatinine, Ser: 1.01 mg/dL — ABNORMAL HIGH (ref 0.44–1.00)
GFR calc Af Amer: >60 mL/min (ref >60)
GFR calc non Af Amer: 53 mL/min — ABNORMAL LOW (ref >60)
Glucose, Bld: 139 mg/dL — ABNORMAL HIGH (ref 70–99)
Potassium: 3.5 mmol/L (ref 3.5–5.1)
Sodium: 138 mmol/L (ref 135–145)

## 2018-06-02 MED ORDER — ONDANSETRON HCL 4 MG/2ML IJ SOLN
4.0000 mg | Freq: Once | INTRAMUSCULAR | Status: AC
  Administered 2018-06-02: 4 mg via INTRAVENOUS
  Filled 2018-06-02: qty 2

## 2018-06-02 MED ORDER — DEXAMETHASONE 4 MG PO TABS
8.0000 mg | ORAL_TABLET | Freq: Once | ORAL | Status: AC
  Administered 2018-06-02: 8 mg via ORAL
  Filled 2018-06-02: qty 2

## 2018-06-02 MED ORDER — LORAZEPAM 0.5 MG PO TABS
0.5000 mg | ORAL_TABLET | Freq: Once | ORAL | Status: AC
  Administered 2018-06-02: 0.5 mg via ORAL
  Filled 2018-06-02: qty 1

## 2018-06-02 MED ORDER — DEXAMETHASONE SODIUM PHOSPHATE 10 MG/ML IJ SOLN
10.0000 mg | Freq: Once | INTRAMUSCULAR | Status: AC
  Administered 2018-06-02: 10 mg via INTRAMUSCULAR
  Filled 2018-06-02: qty 1

## 2018-06-02 MED ORDER — ONDANSETRON HCL 4 MG/2ML IJ SOLN
4.0000 mg | Freq: Once | INTRAMUSCULAR | Status: AC | PRN
  Administered 2018-06-02: 4 mg via INTRAVENOUS
  Filled 2018-06-02: qty 2

## 2018-06-02 MED ORDER — HYDROMORPHONE HCL 1 MG/ML IJ SOLN
0.7500 mg | Freq: Once | INTRAMUSCULAR | Status: AC
  Administered 2018-06-02: 0.75 mg via INTRAMUSCULAR
  Filled 2018-06-02: qty 1

## 2018-06-02 MED ORDER — DIAZEPAM 5 MG PO TABS: 2.5000 mg | ORAL_TABLET | Freq: Three times a day (TID) | ORAL | 0 refills | Status: DC | PRN

## 2018-06-02 MED ORDER — SODIUM CHLORIDE 0.9 % IV BOLUS
500.0000 mL | Freq: Once | INTRAVENOUS | Status: AC
  Administered 2018-06-02: 500 mL via INTRAVENOUS

## 2018-06-02 MED ORDER — KETOROLAC TROMETHAMINE 15 MG/ML IJ SOLN
15.0000 mg | Freq: Once | INTRAMUSCULAR | Status: AC
  Administered 2018-06-02: 15 mg via INTRAMUSCULAR
  Filled 2018-06-02: qty 1

## 2018-06-02 MED ORDER — OXYCODONE-ACETAMINOPHEN 5-325 MG PO TABS: 1.0000 | ORAL_TABLET | ORAL | 0 refills | Status: DC | PRN

## 2018-06-02 MED ORDER — HYDROMORPHONE HCL 1 MG/ML IJ SOLN
0.5000 mg | Freq: Once | INTRAMUSCULAR | Status: AC | PRN
  Administered 2018-06-02: 0.5 mg via INTRAVENOUS
  Filled 2018-06-02: qty 1

## 2018-06-02 MED ORDER — DEXAMETHASONE 4 MG PO TABS: 4.0000 mg | ORAL_TABLET | Freq: Two times a day (BID) | ORAL | 0 refills | Status: DC

## 2018-06-02 MED ORDER — ONDANSETRON HCL 4 MG PO TABS: 4.0000 mg | ORAL_TABLET | Freq: Four times a day (QID) | ORAL | 0 refills | Status: DC

## 2018-06-02 MED ORDER — PROMETHAZINE HCL 25 MG/ML IJ SOLN
12.5000 mg | Freq: Once | INTRAMUSCULAR | Status: AC
  Administered 2018-06-02: 12.5 mg via INTRAMUSCULAR
  Filled 2018-06-02: qty 1

## 2018-06-02 NOTE — ED Notes (Signed)
Bed: WA18 Expected date:  Expected time:  Means of arrival:  Comments: 

## 2018-06-02 NOTE — ED Triage Notes (Signed)
Per pt, states she fell last week-doesn't know if she broke something or it's just her sciatica that is acting up-unable to bear weight-lower back pain-states she bent over to pick something up yesterday and heard something pop

## 2018-06-02 NOTE — ED Notes (Signed)
Attempted to ambulate patient. Pt was able to sit up and stand with assistance. Pt began to feel dizzy and light headed while sitting and standing. Pt did not take any steps. Orthostatic VS obtained and EDP made aware.

## 2018-06-02 NOTE — ED Notes (Addendum)
Patient back from MRI.

## 2018-06-02 NOTE — ED Provider Notes (Signed)
[facility] DEPT Provider Note   CSN: [id number] Arrival date & time: 06/02/18  0757     History   Chief Complaint Chief Complaint  Patient presents with  . Fall  . Sciatica    HPI CHALET KERWIN is a 77 y.o. female.  HPI   77 year old female with right lower back pain with radiation into her right lower extremity.  Yesterday she bent over to pick something up and felt "a pop" in her right lower back.  She said significant pain since that time.  Shortly later she was sitting down for several hours doing paperwork.  When she went to stand back up she had difficulty doing it because of severe pain radiating down her posterior right leg.  Pain is tolerable at rest but still present.  Sniffily worse with bearing weight or movement of her right leg.  No acute numbness or tingling.  Reports that she is having a hard time urinating just because it is so painful to squat to sit down on the toilet.  She does not feel like she is necessarily retaining.  Denies any past history of back surgery.  Past Medical History:  Diagnosis Date  . Anxiety   . Asthma   . Blood transfusion 07/2008   "related to chemo" (07/25/2012)  . Breast cancer ([place])    breast lft  . Chronic lower back pain   . Clotting disorder ([place])    "sudden chest pain; found multiple blood clots in both lungs; they think it was from Tamoxifen" (07/25/2012)  . Exertional dyspnea   . Fibromyalgia   . GERD (gastroesophageal reflux disease)   . Gout   . H/O hiatal hernia   . Heart murmur   . Hx of blood clots 02/2011   "sudden chest pain; found multiple blood clots in both lungs; they think it was from Tamoxifen; haven't had any since 11/2011" (07/25/2012)  . Hypertension   . Left knee DJD   . OSA on CPAP   . Osteoarthritis    "pretty much everywhere" (07/25/2012)  . Pulmonary embolism ([place]) 02/2011  . Right knee DJD   . Seizures ([place])    "seizures vs convulsions related to PCN when I was a  child" (07/25/2012)  . Subcutaneous hematoma 07/25/2012   Intraoperative cultures are pending on this fluid collection that was just outside the left knee joint.  Gram stain had few WBC monos and polys.    Patient Active Problem List   Diagnosis Date Noted  . Chest pain with moderate risk for cardiac etiology 11/10/2016  . Systolic murmur 87/56/4332  . DOE (dyspnea on exertion) 10/11/2014  . Urinary tract infection due to Proteus currently on day 3 of Cipro 07/26/2012  . Subcutaneous hematoma 07/25/2012  . Blood loss anemia 12/22/2011  . Hypokalemia 12/22/2011  . GERD (gastroesophageal reflux disease)   . Arthritis   . Sleep apnea   . Hypertension   . Hx of blood clots   . Fibromyalgia   . Anxiety   . Right knee DJD   . Left knee DJD   . Breast cancer ([place]) 06/09/2011  . MITRAL REGURGITATION 05/13/2009    Past Surgical History:  Procedure Laterality Date  . BREAST BIOPSY Left 03/05/2008   malignant  . BREAST LUMPECTOMY  02/2008   "left" (07/25/2012)  . BREAST SURGERY    . KNEE ARTHROSCOPY  2001;  2009   "right; left" (07/25/2012)  . LEFT HEART CATHETERIZATION WITH CORONARY ANGIOGRAM N/A 10/26/2014  Procedure: LEFT HEART CATHETERIZATION WITH CORONARY ANGIOGRAM;  Surgeon: Burnell Blanks, MD;  Location: [facility] CATH LAB;  Service: Cardiovascular;  Laterality: N/A;  . MASTECTOMY MODIFIED RADICAL / SIMPLE / COMPLETE Left 03/28/2008   "left" (07/25/2012)  . PORTACATH PLACEMENT  03/28/2008; ?05/2008   "left; right" (07/25/2012)  . TONSILLECTOMY  1950  . TOTAL KNEE ARTHROPLASTY  12/21/2011   Procedure: TOTAL KNEE ARTHROPLASTY;  Surgeon: Lorn Junes, MD;  Location: [facility];  Service: Orthopedics;  Laterality: Right;  DR [place] THIS CASE  . TOTAL KNEE ARTHROPLASTY WITH REVISION COMPONENTS  07/25/2012   Procedure: TOTAL KNEE ARTHROPLASTY WITH REVISION COMPONENTS;  Surgeon: Lorn Junes, MD;  Location: [facility];  Service: Orthopedics;  Laterality: Left;  . TUBAL LIGATION  ~  1976  . VAGINAL HYSTERECTOMY  ~ 1983   "endometrosis" (07/25/2012)     OB History   None      Home Medications    Prior to Admission medications   Medication Sig Start Date End Date Taking? Authorizing Provider  acetaminophen (TYLENOL) 325 MG tablet Take 650 mg by mouth every 6 (six) hours as needed for mild pain.    [provider]  acyclovir (ZOVIRAX) 400 MG tablet Take 400 mg by mouth 3 (three) times daily as needed. FOR OUTBREAKS OF FEVER BLISTERS 10/12/16   [provider]  albuterol (PROAIR HFA) 108 (90 Base) MCG/ACT inhaler Inhale 2 puffs into the lungs daily as needed for wheezing or shortness of breath.    [provider]  allopurinol (ZYLOPRIM) 300 MG tablet Take 300 mg by mouth daily.     [provider]  APPLE CIDER VINEGAR PO Take 5 mLs by mouth every morning.    [provider]  Ascorbic Acid (VITAMIN C) 1000 MG tablet Take 1,000 mg by mouth daily.     [provider]  B Complex-Biotin-FA (B-COMPLEX PO) Take 100 mg by mouth at bedtime.     [provider]  benzonatate (TESSALON) 100 MG capsule Take 100 mg by mouth 3 (three) times daily as needed for cough.  04/16/15   [provider]  Biotin 5000 MCG CAPS Take 1 capsule by mouth daily.     [provider]  budesonide (PULMICORT) 0.5 MG/2ML nebulizer solution Take 0.5 mg by nebulization 2 (two) times daily.    [provider]  calcium carbonate (CALCIUM 500) 1250 MG tablet Take 1 tablet by mouth daily.    [provider]  cetirizine (ZYRTEC) 10 MG tablet Take 10 mg by mouth daily.     [provider]  cholecalciferol (VITAMIN D) 400 UNITS TABS Take 400 Units by mouth daily.     [provider]  dicyclomine (BENTYL) 10 MG capsule Take 10 mg by mouth 4 (four) times daily as needed for spasms.    [provider]  fluticasone (FLONASE) 50 MCG/ACT nasal spray Place 1 spray into both nostrils 2 (two) times  daily.    [provider]  hydroxypropyl methylcellulose (ISOPTO TEARS) 2.5 % ophthalmic solution Place 1 drop into both eyes 4 (four) times daily as needed for dry eyes. Dry eyes    [provider]  Hypertonic Nasal Wash (SINUS RINSE) PACK Place into the nose 2 (two) times daily.     [provider]  nabumetone (RELAFEN) 500 MG tablet Take 500 mg by mouth 2 (two) times daily as needed (pain).  05/03/13   [provider]  NON FORMULARY Patient  uses a CPAP machine    [provider]  pantoprazole (PROTONIX) 40 MG tablet Take 40 mg by mouth daily before breakfast. 10/12/16   [provider]  pimecrolimus (ELIDEL) 1 % cream Apply 1 application topically daily.    [provider]  potassium chloride SA (K-DUR,KLOR-CON) 20 MEQ tablet Take 20 mEq by mouth 2 (two) times daily. 10/12/16   [provider]  triamterene-hydrochlorothiazide (MAXZIDE) 75-50 MG tablet Take 1 tablet by mouth daily. 01/06/17   Dorothy Spark, MD  UNABLE TO FIND Rx: [place] (Quantity: 6) Dx: 174.9; Left Mastectomy 04/18/13   Erroll Luna, MD  warfarin (COUMADIN) 5 MG tablet Take 2.5-5 mg by mouth See admin instructions. 2.5 mg at bedtime on Sun/Tues/Thurs/Sat and 5 mg on Mon/Wed/Fri 05/05/11   [provider]    Family History Family History  Problem Relation Age of Onset  . Heart disease Mother   . Diabetes Father   . Heart disease Father   . CVA Father   . Colon polyps Sister        adenomatous  . Heart disease Sister   . Heart disease Brother   . Breast cancer Cousin        pat side, mat and pat aunts  . Colon cancer Neg Hx     Social History Social History   Tobacco Use  . Smoking status: Former Smoker    Packs/day: 1.50    Years: 5.00    Pack years: 7.50    Types: Cigarettes    Last attempt to quit: 12/06/1968    Years since quitting: 49.5  . Smokeless tobacco: Never Used  Substance Use Topics  .  Alcohol use: No    Comment: 07/25/2012 "bottle of champaign on our anniversary q yr; add a drink once in a blue moon for a celebration"  . Drug use: No     Allergies   Morphine and related; Penicillins; and Tape   Review of Systems Review of Systems  All systems reviewed and negative, other than as noted in HPI.  Physical Exam Updated Vital Signs BP (!) 136/51 (BP Location: Right Arm)   Pulse 63   Temp (!) 97.5 F (36.4 C)   Resp 18   Ht 5\' 8"  (1.727 m)   Wt 104.3 kg   LMP  (LMP Unknown)   SpO2 98%   BMI 34.97 kg/m   Physical Exam  Constitutional: She appears well-developed and well-nourished. No distress.  HENT:  Head: Normocephalic and atraumatic.  Eyes: Conjunctivae are normal. Right eye exhibits no discharge. Left eye exhibits no discharge.  Neck: Neck supple.  Cardiovascular: Normal rate, regular rhythm and normal heart sounds. Exam reveals no gallop and no friction rub.  No murmur heard. Pulmonary/Chest: Effort normal and breath sounds normal. No respiratory distress.  Abdominal: Soft. She exhibits no distension. There is no tenderness.  Musculoskeletal: She exhibits no edema or tenderness.  Tenderness right lower lumbar back into her right buttock.  No overlying skin changes.  Severe pain with attempted range of motion of the right hip, fairly flexion.  She is neurovascular intact distally. Scar consistent with prior R knee replacement.   Neurological: She is alert.  Skin: Skin is warm and dry.  Psychiatric: She has a normal mood and affect. Her behavior is normal. Thought content normal.  Nursing note and vitals reviewed.    ED Treatments / Results  Labs (all labs ordered are listed, but only abnormal results are displayed)  Labs Reviewed  URINALYSIS, ROUTINE W REFLEX MICROSCOPIC - Abnormal; Notable for the following components:      Result Value   APPearance CLOUDY (*)    All other components within normal limits  CBC WITH DIFFERENTIAL/PLATELET -  Abnormal; Notable for the following components:   WBC 13.0 (*)    RBC 3.64 (*)    MCV 102.5 (*)    Neutro Abs 12.4 (*)    Lymphs Abs 0.3 (*)    All other components within normal limits  BASIC METABOLIC PANEL - Abnormal; Notable for the following components:   Glucose, Bld 139 (*)    Creatinine, Ser 1.01 (*)    GFR calc non Af Amer 53 (*)    All other components within normal limits  PROTIME-INR    EKG EKG Interpretation  Date/Time:  Thursday June 02 2018 16:08:25 EST Ventricular Rate:  87 PR Interval:    QRS Duration: 125 QT Interval:  357 QTC Calculation: 427 R Axis:   -38 Text Interpretation:  Sinus rhythm Nonspecific IVCD with LAD Interpretation limited secondary to artifact Confirmed by Virgel Manifold [phone]) on 06/02/2018 4:32:46 PM   Radiology Dg Lumbar Spine Complete  Result Date: 06/02/2018 CLINICAL DATA:  Back pain. EXAM: LUMBAR SPINE - COMPLETE 4+ VIEW COMPARISON:  CT 10/21/2011 FINDINGS: Lumbar spine scratched it mild scoliosis lumbar spine concave right. No acute bony abnormality identified. No evidence of fracture. Diffuse multilevel degenerative change. IMPRESSION: Lumbar spine scoliosis concave right. Diffuse multilevel degenerative change. No acute abnormality. Electronically Signed   By: Marcello Moores  Register   On: 06/02/2018 15:33    Procedures Procedures (including critical care time)  Medications Ordered in ED Medications  ketorolac (TORADOL) 15 MG/ML injection 15 mg (has no administration in time range)  dexamethasone (DECADRON) tablet 8 mg (has no administration in time range)  HYDROmorphone (DILAUDID) injection 0.75 mg (has no administration in time range)  LORazepam (ATIVAN) tablet 0.5 mg (has no administration in time range)     Initial Impression / Assessment and Plan / ED Course  I have reviewed the triage vital signs and the nursing notes.  Pertinent labs & imaging results that were available during my care of the patient were reviewed by  me and considered in my medical decision making (see chart for details).  Clinical Course as of Jun 03 1631  Thu Jun 02, 2018  0928 Nursing reported the patient vomited immediately after given Decadron and Ativan.  IM meds subsequently ordered.   [SK]    Clinical Course User Index [SK] Virgel Manifold, MD    77 year old female with right lower back pain with radicular symptoms in her right lower extremity.  She appears to be neurovascularly intact although range of motion of the right leg is limited by pain.  Will treat symptomatically.  Reassessment.  Pain improved but then significant nausea and dizziness when tried to ambulate. Orthostatic. IV was placed and given fluids. Labs checked. Mild increase in Cr. Plain films w/o acute pathology.   Nausea now much improved but still difficulty with standing because of pain. Will check INR and MRI to r/o spinal epidural hematoma or other emergent pathology. If no emergent findings then the plan is discharge with continued symptomatic tx and outpt FU.   Final Clinical Impressions(s) / ED Diagnoses   Final diagnoses:  Sciatica of right side    ED Discharge Orders    None       Virgel Manifold, MD 06/03/18 0900

## 2018-06-02 NOTE — ED Notes (Signed)
Bed: WA17 Expected date:  Expected time:  Means of arrival:  Comments: Shut down for wall O2 maintenance

## 2018-06-02 NOTE — ED Notes (Signed)
Bed: WHALD Expected date:  Expected time:  Means of arrival:  Comments: 

## 2018-06-02 NOTE — ED Provider Notes (Signed)
Patient seen after sign out.    She appears improved following her ED evaluation.  MRI lumbar spine is resulted.  Imaging does not suggest significant acute pathology.   Patient desires discharge.  She declines further ED treatment and/or possible admission.  Patient understands need for close follow-up.  Strict return precautions given and understood.   Valarie Merino, MD 06/02/18 [phone]

## 2018-06-02 NOTE — ED Notes (Signed)
After taking oral medications. Pt immediately vomited. MD made aware.

## 2018-12-14 ENCOUNTER — Telehealth: Payer: Self-pay | Admitting: *Deleted

## 2018-12-14 NOTE — Telephone Encounter (Signed)
Virtual Visit Pre-Appointment Phone Call  "(Name), I am calling you today to discuss your upcoming appointment. We are currently trying to limit exposure to the virus that causes COVID-19 by seeing patients at home rather than in the office."  1. "What is the BEST phone number to call the day of the visit?" - include this in appointment notes  2. "Do you have or have access to (through a family member/friend) a smartphone with video capability that we can use for your visit?" a. If yes - list this number in appt notes as "cell" (if different from BEST phone #) and list the appointment type as a VIDEO visit in appointment notes b. If no - list the appointment type as a PHONE visit in appointment notes  3. Confirm consent - "In the setting of the current Covid19 crisis, you are scheduled for a (phone or video) visit with your provider on (Friday, May 29) at (2:00 pm ).  Just as we do with many in-office visits, in order for you to participate in this visit, we must obtain consent.  If you'd like, I can send this to your mychart (if signed up) or email for you to review.  Otherwise, I can obtain your verbal consent now.  All virtual visits are billed to your insurance company just like a normal visit would be.  By agreeing to a virtual visit, we'd like you to understand that the technology does not allow for your provider to perform an examination, and thus may limit your provider's ability to fully assess your condition. If your provider identifies any concerns that need to be evaluated in person, we will make arrangements to do so.  Finally, though the technology is pretty good, we cannot assure that it will always work on either your or our end, and in the setting of a video visit, we may have to convert it to a phone-only visit.  In either situation, we cannot ensure that we have a secure connection.  Are you willing to proceed?" STAFF: Did the patient verbally acknowledge consent to telehealth  visit? Document YES/NO here: YES  4. Advise patient to be prepared - "Two hours prior to your appointment, go ahead and check your blood pressure, pulse, oxygen saturation, and your weight (if you have the equipment to check those) and write them all down. When your visit starts, your provider will ask you for this information. If you have an Apple Watch or Kardia device, please plan to have heart rate information ready on the day of your appointment. Please have a pen and paper handy nearby the day of the visit as well."  5. Give patient instructions for MyChart download to smartphone OR Doximity/Doxy.me as below if video visit (depending on what platform provider is using)  6. Inform patient they will receive a phone call 15 minutes prior to their appointment time (may be from unknown caller ID) so they should be prepared to answer    [name] has been deemed a candidate for a follow-up tele-health visit to limit community exposure during the Covid-19 pandemic. I spoke with the patient via phone to ensure availability of phone/video source, confirm preferred email & phone number, and discuss instructions and expectations.  I reminded Lydia Campbell to be prepared with any vital sign and/or heart rhythm information that could potentially be obtained via home monitoring, at the time of her visit. I reminded Lydia Campbell to expect a  phone call prior to her visit.  Lydia Campbell Lydia Campbell 12/14/2018 8:37 AM   INSTRUCTIONS FOR DOWNLOADING THE MYCHART APP TO SMARTPHONE  - The patient must first make sure to have activated MyChart and know their login information - If Apple, go to App Store and type in MyChart in the search bar and download the app. If Android, ask patient to go to [employer] and type in [place] in the search bar and download the app. The app is free but as with any other app downloads, their phone may require them to verify saved  payment information or Apple/Android password.  - The patient will need to then log into the app with their MyChart username and password, and select [name] as their healthcare provider to link the account. When it is time for your visit, go to the MyChart app, find appointments, and click Begin Video Visit. Be sure to Select Allow for your device to access the Microphone and Camera for your visit. You will then be connected, and your provider will be with you shortly.  **If they have any issues connecting, or need assistance please contact MyChart service desk [phone])**  **If using a computer, in order to ensure the best quality for their visit they will need to use either of the following Internet Browsers: [employer], or Google Chrome**  IF USING DOXIMITY or DOXY.ME - The patient will receive a link just prior to their visit by text.     FULL LENGTH CONSENT FOR TELE-HEALTH VISIT   I hereby voluntarily request, consent and authorize [facility] and its employed or contracted physicians, physician assistants, nurse practitioners or other licensed health care professionals (the Practitioner), to provide me with telemedicine health care services (the "Services") as deemed necessary by the treating Practitioner. I acknowledge and consent to receive the Services by the Practitioner via telemedicine. I understand that the telemedicine visit will involve communicating with the Practitioner through live audiovisual communication technology and the disclosure of certain medical information by electronic transmission. I acknowledge that I have been given the opportunity to request an in-person assessment or other available alternative prior to the telemedicine visit and am voluntarily participating in the telemedicine visit.  I understand that I have the right to withhold or withdraw my consent to the use of telemedicine in the course of my care at any time, without affecting  my right to future care or treatment, and that the Practitioner or I may terminate the telemedicine visit at any time. I understand that I have the right to inspect all information obtained and/or recorded in the course of the telemedicine visit and may receive copies of available information for a reasonable fee.  I understand that some of the potential risks of receiving the Services via telemedicine include:  Marland Kitchen Delay or interruption in medical evaluation due to technological equipment failure or disruption; . Information transmitted may not be sufficient (e.g. poor resolution of images) to allow for appropriate medical decision making by the Practitioner; and/or  . In rare instances, security protocols could fail, causing a breach of personal health information.  Furthermore, I acknowledge that it is my responsibility to provide information about my medical history, conditions and care that is complete and accurate to the best of my ability. I acknowledge that Practitioner's advice, recommendations, and/or decision may be based on factors not within their control, such as incomplete or inaccurate data provided by me or distortions of diagnostic images or specimens that may result  from electronic transmissions. I understand that the practice of medicine is not an exact science and that Practitioner makes no warranties or guarantees regarding treatment outcomes. I acknowledge that I will receive a copy of this consent concurrently upon execution via email to the email address I last provided but may also request a printed copy by calling the office of [facility].    I understand that my insurance will be billed for this visit.   I have read or had this consent read to me. . I understand the contents of this consent, which adequately explains the benefits and risks of the Services being provided via telemedicine.  . I have been provided ample opportunity to ask questions regarding this consent and the  Services and have had my questions answered to my satisfaction. . I give my informed consent for the services to be provided through the use of telemedicine in my medical care  By participating in this telemedicine visit I agree to the above.

## 2018-12-16 ENCOUNTER — Encounter: Payer: Self-pay | Admitting: Physician Assistant

## 2018-12-16 ENCOUNTER — Telehealth (INDEPENDENT_AMBULATORY_CARE_PROVIDER_SITE_OTHER): Payer: Medicare Other | Admitting: Physician Assistant

## 2018-12-16 ENCOUNTER — Other Ambulatory Visit: Payer: Self-pay

## 2018-12-16 VITALS — BP 117/71 | HR 79 | Ht 68.0 in | Wt 210.0 lb

## 2018-12-16 DIAGNOSIS — I10 Essential (primary) hypertension: Secondary | ICD-10-CM

## 2018-12-16 DIAGNOSIS — R609 Edema, unspecified: Secondary | ICD-10-CM

## 2018-12-16 DIAGNOSIS — I2510 Atherosclerotic heart disease of native coronary artery without angina pectoris: Secondary | ICD-10-CM

## 2018-12-16 DIAGNOSIS — Z86711 Personal history of pulmonary embolism: Secondary | ICD-10-CM

## 2018-12-16 NOTE — Patient Instructions (Signed)
Medication Instructions:  Your physician recommends that you continue on your current medications as directed. Please refer to the Current Medication list given to you today.  If you need a refill on your cardiac medications before your next appointment, please call your pharmacy.   Lab work: None ordered  If you have labs (blood work) drawn today and your tests are completely normal, you will receive your results only by: Marland Kitchen MyChart Message (if you have MyChart) OR . A paper copy in the mail If you have any lab test that is abnormal or we need to change your treatment, we will call you to review the results.  Testing/Procedures: None ordered  Follow-Up: At [facility], you and your health needs are our priority.  As part of our continuing mission to provide you with exceptional heart care, we have created designated Provider Care Teams.  These Care Teams include your primary Cardiologist (physician) and Advanced Practice Providers (APPs -  Physician Assistants and Nurse Practitioners) who all work together to provide you with the care you need, when you need it. You will need a follow up appointment in 12 months.  Please call our office 2 months in advance to schedule this appointment.  You may see Ena Dawley, MD or one of the following Advanced Practice Providers on your designated Care Team:   [place], PA-C Melina Copa, PA-C . Ermalinda Barrios, PA-C  Any Other Special Instructions Will Be Listed Below (If Applicable). If you notice any bleeding such as blood in stool, black tarry stools, blood in urine, nosebleeds or any other unusual bleeding, call your doctor immediately. It is not normal to have this kind of bleeding while on a blood thinner and usually indicates there is an underlying problem with one of your body systems that needs to be checked out.   Remember that biotin can interfere with lab results so when you get yours checked with primary care, make sure to  inquire beforehand how long you should hold this beforehand.

## 2018-12-16 NOTE — Progress Notes (Signed)
Virtual Visit via Video Note   This visit type was conducted due to national recommendations for restrictions regarding the COVID-19 Pandemic (e.g. social distancing) in an effort to limit this patient's exposure and mitigate transmission in our community.  Due to her co-morbid illnesses, this patient is at least at moderate risk for complications without adequate follow up.  This format is felt to be most appropriate for this patient at this time.  All issues noted in this document were discussed and addressed.  A limited physical exam was performed with this format.  Please refer to the patient's chart for her consent to telehealth for [facility].   Date:  12/16/2018   ID:  Lydia Campbell, DOB Aug 24, 1940, MRN [id number]  Patient Location: Home Provider Location: Home  PCP:  Galen Manila, MD  Cardiologist:  Ena Dawley, MD  Electrophysiologist:  None   Evaluation Performed:  Follow-Up Visit  Chief Complaint:  F/u mild CAD  History of Present Illness:    Lydia Campbell is a 78 y.o. female with nonobstructive CAD by cath in 2016, PE in 2012 on chronic anticoagulation with Coumadin, obesity, breast CA s/p chemo, anxiety, asthma, fibromyalgia, GERD, hiatal hernia, OSA on CPAP, osteoarthritis who presents for overdue follow-up. She remotely had chest discomfort with abnormal stress test. Cardiac cath 10/26/14 showed 20% OM and minor luminal irregularities of the RCA with EF 60%. 2D Echo was done as well with EF 55-60%, no RWMA, trivial MR/PR. Her chest pain was previously felt to be related to an asthma exacerbation or anxiety. She also has chronic mild lower extremity edema. Event monitor in 11/2016 was normal. Last labs 05/2018 showed K 3.5, Cr 1.01, Hgb 12.1, Plt 272, 10/2016 LDL 110.  She is seen virtually today and is doing great without complaints. No CP, SOB, palpitations, dizziness, orthopnea, worsening edema. BP is running good. Her PCP follows her Coumadin and it  sounds like their preference has been lifelong anticoagulation as tolerated. The patient does not have symptoms concerning for COVID-19 infection (fever, chills, cough, or new shortness of breath).    Past Medical History:  Diagnosis Date  . Anxiety   . Asthma   . Blood transfusion 07/2008   "related to chemo" (07/25/2012)  . Breast cancer ([place])    breast lft  . Chronic edema   . Chronic lower back pain   . Clotting disorder ([place])    "sudden chest pain; found multiple blood clots in both lungs; they think it was from Tamoxifen" (07/25/2012)  . Fibromyalgia   . GERD (gastroesophageal reflux disease)   . Gout   . H/O hiatal hernia   . Heart murmur   . Hx of blood clots 02/2011   "sudden chest pain; found multiple blood clots in both lungs; they think it was from Tamoxifen; haven't had any since 11/2011" (07/25/2012)  . Hypertension   . Left knee DJD   . Mild CAD   . OSA on CPAP   . Osteoarthritis    "pretty much everywhere" (07/25/2012)  . Pulmonary embolism ([place]) 02/2011  . Right knee DJD   . Seizures ([place])    "seizures vs convulsions related to PCN when I was a child" (07/25/2012)  . Subcutaneous hematoma 07/25/2012   Intraoperative cultures are pending on this fluid collection that was just outside the left knee joint.  Gram stain had few WBC monos and polys.   Past Surgical History:  Procedure Laterality Date  . BREAST BIOPSY Left 03/05/2008  malignant  . BREAST LUMPECTOMY  02/2008   "left" (07/25/2012)  . BREAST SURGERY    . KNEE ARTHROSCOPY  2001;  2009   "right; left" (07/25/2012)  . LEFT HEART CATHETERIZATION WITH CORONARY ANGIOGRAM N/A 10/26/2014   Procedure: LEFT HEART CATHETERIZATION WITH CORONARY ANGIOGRAM;  Surgeon: Burnell Blanks, MD;  Location: [facility] CATH LAB;  Service: Cardiovascular;  Laterality: N/A;  . MASTECTOMY MODIFIED RADICAL / SIMPLE / COMPLETE Left 03/28/2008   "left" (07/25/2012)  . PORTACATH PLACEMENT  03/28/2008; ?05/2008   "left; right" (07/25/2012)  .  TONSILLECTOMY  1950  . TOTAL KNEE ARTHROPLASTY  12/21/2011   Procedure: TOTAL KNEE ARTHROPLASTY;  Surgeon: Lorn Junes, MD;  Location: [facility];  Service: Orthopedics;  Laterality: Right;  DR [place] THIS CASE  . TOTAL KNEE ARTHROPLASTY WITH REVISION COMPONENTS  07/25/2012   Procedure: TOTAL KNEE ARTHROPLASTY WITH REVISION COMPONENTS;  Surgeon: Lorn Junes, MD;  Location: [facility];  Service: Orthopedics;  Laterality: Left;  . TUBAL LIGATION  ~ 1976  . VAGINAL HYSTERECTOMY  ~ 1983   "endometrosis" (07/25/2012)     Current Meds  Medication Sig  . acetaminophen (TYLENOL) 325 MG tablet Take 650 mg by mouth every 6 (six) hours as needed for mild pain.  Marland Kitchen acyclovir (ZOVIRAX) 400 MG tablet Take 400 mg by mouth 3 (three) times daily as needed (fever blisters).   Marland Kitchen albuterol (PROAIR HFA) 108 (90 Base) MCG/ACT inhaler Inhale 2 puffs into the lungs daily as needed for wheezing or shortness of breath.  . allopurinol (ZYLOPRIM) 300 MG tablet Take 300 mg by mouth daily.   Marland Kitchen Apple Cider Vinegar 600 MG CAPS Take 600 mg by mouth daily.  . Ascorbic Acid (VITAMIN C) 1000 MG tablet Take 1,000 mg by mouth daily.  . benzonatate (TESSALON) 100 MG capsule Take 100 mg by mouth 3 (three) times daily as needed for cough.   . Biotin 5000 MCG CAPS Take 1 capsule by mouth daily.   . budesonide (PULMICORT) 0.5 MG/2ML nebulizer solution Take 0.5 mg by nebulization 2 (two) times daily.  . calcium carbonate (CALCIUM 500) 1250 MG tablet Take 1,250 mg by mouth daily.   . cetirizine (ZYRTEC) 10 MG tablet Take 10 mg by mouth daily.   . cholecalciferol (VITAMIN D) 400 UNITS TABS Take 400 Units by mouth daily.   Marland Kitchen dexamethasone (DECADRON) 4 MG tablet Take 4 mg by mouth as needed.  . dicyclomine (BENTYL) 10 MG capsule Take 10 mg by mouth 4 (four) times daily as needed for spasms.  . fluticasone (FLONASE) 50 MCG/ACT nasal spray Place 1 spray into both nostrils 2 (two) times daily.  Marland Kitchen guaiFENesin (MUCINEX) 600 MG 12  hr tablet Take 600 mg by mouth daily.  . hydroxypropyl methylcellulose (ISOPTO TEARS) 2.5 % ophthalmic solution Place 1 drop into both eyes 4 (four) times daily as needed for dry eyes.   . Hypertonic Nasal Wash (SINUS RINSE) PACK Place 1 each into the nose daily.   . montelukast (SINGULAIR) 10 MG tablet Take 10 mg by mouth daily.  . nabumetone (RELAFEN) 500 MG tablet Take 500 mg by mouth 2 (two) times daily as needed for moderate pain.   . NON FORMULARY CPAP machine at bedtime  . ondansetron (ZOFRAN) 4 MG tablet Take 4 mg by mouth as needed for nausea or vomiting.  Marland Kitchen oxyCODONE-acetaminophen (PERCOCET/ROXICET) 5-325 MG tablet Take 1 tablet by mouth every 4 (four) hours as needed for severe pain.  . pantoprazole (  PROTONIX) 40 MG tablet Take 40 mg by mouth daily before breakfast.  . pimecrolimus (ELIDEL) 1 % cream Apply 1 application topically daily.  . potassium chloride SA (K-DUR,KLOR-CON) 20 MEQ tablet Take 20 mEq by mouth 2 (two) times daily.  Marland Kitchen sulfamethoxazole-trimethoprim (BACTRIM DS) 800-160 MG tablet Take 1 tablet by mouth as needed.  . triamterene-hydrochlorothiazide (MAXZIDE) 75-50 MG tablet Take 1 tablet by mouth 2 (two) times daily.  Marland Kitchen UNABLE TO FIND Rx: L8000- Post Surgical Bras (Quantity: 6) Dx: 174.9; Left Mastectomy  . warfarin (COUMADIN) 5 MG tablet Take 2.5-5 mg by mouth See admin instructions. 2.5 mg at bedtime on Sun/Tues/Thurs/Sat and 5 mg on Mon/Wed/Fri  . [DISCONTINUED] Ascorbic Acid (VITAMIN C) 1000 MG tablet Take 1,000 mg by mouth daily.      Allergies:   Morphine and related; Penicillins; Clarithromycin; and Tape   Social History   Tobacco Use  . Smoking status: Former Smoker    Packs/day: 1.50    Years: 5.00    Pack years: 7.50    Types: Cigarettes    Last attempt to quit: 12/06/1968    Years since quitting: 50.0  . Smokeless tobacco: Never Used  Substance Use Topics  . Alcohol use: No    Comment: 07/25/2012 "bottle of champaign on our anniversary q yr; add a  drink once in a blue moon for a celebration"  . Drug use: No     Family Hx: The patient's family history includes Breast cancer in her cousin; CVA in her father; Colon polyps in her sister; Diabetes in her father; Heart disease in her brother, father, mother, and sister. There is no history of Colon cancer.  ROS:   Please see the history of present illness.    All other systems reviewed and are negative.   Prior CV studies:   Most recent pertinent cardiac studies are outlined above.   Labs/Other Tests and Data Reviewed:    EKG:  An ECG dated 06/02/18 was personally reviewed today and demonstrated:  NSR 87bpm, NSIVCD, nonspecific TW changes, tremor  Recent Labs: 06/02/2018: BUN 21; Creatinine, Ser 1.01; Hemoglobin 12.1; Platelets 272; Potassium 3.5; Sodium 138   Recent Lipid Panel Lab Results  Component Value Date/Time   CHOL 175 11/11/2016 02:44 AM   TRIG 116 11/11/2016 02:44 AM   HDL 42 11/11/2016 02:44 AM   CHOLHDL 4.2 11/11/2016 02:44 AM   LDLCALC 110 (H) 11/11/2016 02:44 AM    Wt Readings from Last 3 Encounters:  12/16/18 210 lb (95.3 kg)  06/02/18 230 lb (104.3 kg)  02/03/17 230 lb (104.3 kg)     Objective:    Vital Signs:  BP 117/71   Pulse 79   Ht 5\' 8"  (1.727 m)   Wt 210 lb (95.3 kg)   LMP  (LMP Unknown)   BMI 31.93 kg/m    VITAL SIGNS:  reviewed  General - pleasant female in no acute distress HEENT - NCAT, EOM intact Pulm - No labored breathing, no coughing during visit, no audible wheezing, speaking in full sentences Neuro - A+Ox3, no slurred speech, answers questions appropriately Psych - Pleasant affect    ASSESSMENT & PLAN:    1. Minimal CAD by cath 2016 - asymptomatic. Continue surveillance for symptoms. Lipids are followed by PCP. Would recommend consideration of statin therapy if LDL is elevated. Will cc a copy of this note for their review. The patient will ask them to fax [place] a copy of last labs as well. 2. Essential HTN -  well controlled.  Continue current regimen. 3. Chronic lower extremity edema - stable per patient report, no concerns. Labs are followed by PCP. Of note she is on biotin - we discussed that some labs prefer this be held prior to getting labs checked (in particular can interfere with thyroid labs). She will discuss with PCP next time they do labs about how long she should hold. 4. History of PE - this is managed by PCP. Routine bleeding precautions also outlined on routine AVS instructions.  COVID-19 Education: The signs and symptoms of COVID-19 were discussed with the patient and how to seek care for testing (follow up with PCP or arrange E-visit).  The importance of social distancing was discussed today.  Time:   Today, I have spent 17 minutes with the patient with telehealth technology discussing the above problems.     Medication Adjustments/Labs and Tests Ordered: Current medicines are reviewed at length with the patient today.  Concerns regarding medicines are outlined above.    Disposition:  Follow up 1 year with Dr. Meda Coffee.  Signed, Charlie Pitter, PA-C  12/16/2018 2:08 PM    [facility]

## 2019-01-23 ENCOUNTER — Other Ambulatory Visit: Payer: Self-pay | Admitting: General Surgery

## 2019-01-23 DIAGNOSIS — Z1231 Encounter for screening mammogram for malignant neoplasm of breast: Secondary | ICD-10-CM

## 2019-01-25 ENCOUNTER — Other Ambulatory Visit: Payer: Self-pay

## 2019-01-25 ENCOUNTER — Ambulatory Visit
Admission: RE | Admit: 2019-01-25 | Discharge: 2019-01-25 | Disposition: A | Discharge status: Home / Self Care | Payer: Medicare Other | Source: Ambulatory Visit | Attending: General Surgery | Admitting: General Surgery

## 2019-01-25 DIAGNOSIS — Z1231 Encounter for screening mammogram for malignant neoplasm of breast: Secondary | ICD-10-CM

## 2019-04-26 ENCOUNTER — Encounter: Payer: Self-pay | Admitting: Gastroenterology

## 2019-10-19 ENCOUNTER — Telehealth: Payer: Self-pay | Admitting: *Deleted

## 2019-10-19 NOTE — Telephone Encounter (Signed)
   [facility] Pre-operative Risk Assessment    Request for surgical clearance:  1. What type of surgery is being performed? RIGHT TOTAL SHOULDER REPLACEMENT    2. When is this surgery scheduled? TBD   3. What type of clearance is required (medical clearance vs. Pharmacy clearance to hold med vs. Both)? BOTH  4. Are there any medications that need to be held prior to surgery and how long?  COUMADIN; POSSIBLE BRIDGING?   5. Practice name and name of physician performing surgery? [facility]; DR. DAX VARKEY   6. What is your office phone number [phone] EXT [phone]    7.   What is your office fax number [phone]  8.   Anesthesia type (None, local, MAC, general) ? CHOICE   Julaine Hua 10/19/2019, 9:28 AM  _________________________________________________________________   (provider comments below)

## 2019-10-19 NOTE — Telephone Encounter (Signed)
appt with Kathyrn Drown, NP 11/06/19 @ 2:45. I will forward clearance notes to NP for upcoming appt. Will send a message to surgeon Dr. Ophelia Charter pt has appt 4/19 with cardiology. I will remove from the pre op call back pool.

## 2019-10-19 NOTE — Telephone Encounter (Signed)
   Primary Cardiologist:Katarina Meda Coffee, MD  Chart reviewed as part of pre-operative protocol coverage. Because of Genea B Brocker's past medical history and time since last visit, he/she will require a follow-up visit in order to better assess preoperative cardiovascular risk. It has been a month shy of 1 year and needs appt to clear.     Pre-op covering staff: - Please schedule appointment and call patient to inform them. - Please contact requesting surgeon's office via preferred method (i.e, phone, fax) to inform them of need for appointment prior to surgery.  If applicable, this message will also be routed to pharmacy pool and/or primary cardiologist for input on holding anticoagulant/antiplatelet agent as requested below so that this information is available at time of patient's appointment.   Cecilie Kicks, NP  10/19/2019, 11:12 AM

## 2019-10-19 NOTE — Telephone Encounter (Signed)
Left message to please call the office to schedule an office visit for pre op clearance.

## 2019-10-26 NOTE — Progress Notes (Signed)
Cardiology Office Note:    Date:  10/27/2019   ID:  TANMAYI PENDLEY, DOB [birth year], MRN [id number]  PCP:  Galen Manila, MD  Cardiologist:  Ena Dawley, MD  Electrophysiologist:  None   Referring MD: Galen Manila, MD   Chief Complaint:  Surgical Clearance    Patient Profile:    Lydia Campbell is a 79 y.o. female with:   Coronary artery disease  Mild, nonobstructive by cardiac catheterization 10/2014  History of pulmonary embolism in 2012  Chronic anticoagulation with Coumadin  Obesity  Breast CA s/p chemotherapy  Anxiety  Asthma  Fibromyalgia  GERD/hiatal hernia  OSA, CPAP  DJD  Prior CV studies: Event monitor 11/18/2016 Normal  Myoview 11/11/2016 No ischemia, EF 63; low risk  Cardiac catheterization 10/26/2014 OM 20 RCA minor irregularities EF 60  Myoview 10/23/2014 EF 65, inferior ischemia; intermediate risk  Echocardiogram 10/22/2014 EF 55-60, normal wall motion, trivial MR, trivial PI  History of Present Illness:    Lydia Campbell was last seen by Melina Copa, PA-C in May 2020 via telemedicine.  She returns for surgical clearance.  She needs a right total shoulder replacement with Dr. Griffin Basil later this month.  She is here alone today.  She has not had significant chest discomfort.  She has shortness of breath related to asthma.  She has worse symptoms of asthma in the wintertime.  Now that it has turned into spring, she is feeling much better.  She has not had orthopnea.  She uses CPAP at night.  She has chronic lower extremity swelling related to venous insufficiency and lymphedema.  She has not had syncope.  She is able to do most activities without limitation (vacuuming, climb steps, walk 2 blocks, etc.).    Past Medical History:  Diagnosis Date  . Anxiety   . Asthma   . Blood transfusion 07/2008   "related to chemo" (07/25/2012)  . Breast cancer ([place])    breast lft  . Chronic edema   . Chronic lower back pain   . Clotting disorder  ([place])    "sudden chest pain; found multiple blood clots in both lungs; they think it was from Tamoxifen" (07/25/2012)  . Fibromyalgia   . GERD (gastroesophageal reflux disease)   . Gout   . H/O hiatal hernia   . Heart murmur   . Hx of blood clots 02/2011   "sudden chest pain; found multiple blood clots in both lungs; they think it was from Tamoxifen; haven't had any since 11/2011" (07/25/2012)  . Hypertension   . Left knee DJD   . Mild CAD   . OSA on CPAP   . Osteoarthritis    "pretty much everywhere" (07/25/2012)  . Pulmonary embolism ([place]) 02/2011  . Right knee DJD   . Seizures ([place])    "seizures vs convulsions related to PCN when I was a child" (07/25/2012)  . Subcutaneous hematoma 07/25/2012   Intraoperative cultures are pending on this fluid collection that was just outside the left knee joint.  Gram stain had few WBC monos and polys.    Current Medications: Current Meds  Medication Sig  . acetaminophen (TYLENOL) 325 MG tablet Take 650 mg by mouth every 6 (six) hours as needed for mild pain.  Marland Kitchen acyclovir (ZOVIRAX) 400 MG tablet Take 400 mg by mouth 3 (three) times daily as needed (fever blisters).   Marland Kitchen albuterol (PROAIR HFA) 108 (90 Base) MCG/ACT inhaler Inhale 2 puffs into the lungs daily as needed for wheezing or  shortness of breath.  . allopurinol (ZYLOPRIM) 300 MG tablet Take 300 mg by mouth daily.   . Ascorbic Acid (VITAMIN C) 1000 MG tablet Take 1,000 mg by mouth daily.  . benzonatate (TESSALON) 100 MG capsule Take 100 mg by mouth 3 (three) times daily as needed for cough.   . Biotin 5000 MCG CAPS Take 1 capsule by mouth daily.   . budesonide (PULMICORT) 0.5 MG/2ML nebulizer solution Take 0.5 mg by nebulization 2 (two) times daily.  . calcium carbonate (CALCIUM 500) 1250 MG tablet Take 1,250 mg by mouth daily.   . cetirizine (ZYRTEC) 10 MG tablet Take 10 mg by mouth daily.   . cholecalciferol (VITAMIN D) 400 UNITS TABS Take 400 Units by mouth daily.   Marland Kitchen dexamethasone (DECADRON) 4 MG  tablet Take 4 mg by mouth as needed (pain).   Marland Kitchen dicyclomine (BENTYL) 10 MG capsule Take 10 mg by mouth 4 (four) times daily as needed for spasms.  . fluticasone (FLONASE) 50 MCG/ACT nasal spray Place 1 spray into both nostrils 2 (two) times daily.  Marland Kitchen guaiFENesin (MUCINEX) 600 MG 12 hr tablet Take 600 mg by mouth daily.  . hydroxypropyl methylcellulose (ISOPTO TEARS) 2.5 % ophthalmic solution Place 1 drop into both eyes 4 (four) times daily as needed for dry eyes.   . Hypertonic Nasal Wash (SINUS RINSE) PACK Place 1 each into the nose daily.   . montelukast (SINGULAIR) 10 MG tablet Take 10 mg by mouth daily.  . nabumetone (RELAFEN) 500 MG tablet Take 500 mg by mouth 2 (two) times daily as needed for moderate pain.   . NON FORMULARY CPAP machine at bedtime  . ondansetron (ZOFRAN) 4 MG tablet Take 4 mg by mouth as needed for nausea or vomiting.  Marland Kitchen oxyCODONE-acetaminophen (PERCOCET/ROXICET) 5-325 MG tablet Take 1 tablet by mouth every 4 (four) hours as needed for severe pain.  . pantoprazole (PROTONIX) 40 MG tablet Take 40 mg by mouth daily before breakfast.  . potassium chloride SA (K-DUR,KLOR-CON) 20 MEQ tablet Take 20 mEq by mouth 2 (two) times daily.  Marland Kitchen triamterene-hydrochlorothiazide (MAXZIDE) 75-50 MG tablet Take 1 tablet by mouth 2 (two) times daily.  Marland Kitchen UNABLE TO FIND Rx: L8000- Post Surgical Bras (Quantity: 6) Dx: 174.9; Left Mastectomy     Allergies:   Clarithromycin, Morphine and related, Penicillins, and Tape   Social History   Tobacco Use  . Smoking status: Former Smoker    Packs/day: 1.50    Years: 5.00    Pack years: 7.50    Types: Cigarettes    Quit date: 12/06/1968    Years since quitting: 50.9  . Smokeless tobacco: Never Used  Substance Use Topics  . Alcohol use: No    Comment: 07/25/2012 "bottle of champaign on our anniversary q yr; add a drink once in a blue moon for a celebration"  . Drug use: No     Family Hx: The patient's family history includes Breast cancer in  her cousin; CVA in her father; Colon polyps in her sister; Diabetes in her father; Heart disease in her brother, father, mother, and sister. There is no history of Colon cancer.  ROS   EKGs/Labs/Other Test Reviewed:    EKG:  EKG is  ordered today.  The ekg ordered today demonstrates normal sinus rhythm, heart rate 62, left axis deviation, no ST-T wave changes, QTC 414  Recent Labs: No results found for requested labs within last 8760 hours.   Recent Lipid Panel Lab Results  Component Value  Date/Time   CHOL 175 11/11/2016 02:44 AM   TRIG 116 11/11/2016 02:44 AM   HDL 42 11/11/2016 02:44 AM   CHOLHDL 4.2 11/11/2016 02:44 AM   LDLCALC 110 (H) 11/11/2016 02:44 AM    Physical Exam:    VS:  BP (!) 102/54   Pulse 62   Ht 5\' 8"  (1.727 m)   Wt 212 lb 12.8 oz (96.5 kg)   LMP  (LMP Unknown)   SpO2 98%   BMI 32.36 kg/m     Wt Readings from Last 3 Encounters:  10/27/19 212 lb 12.8 oz (96.5 kg)  12/16/18 210 lb (95.3 kg)  06/02/18 230 lb (104.3 kg)     Constitutional:      Appearance: Healthy appearance. Not in distress.  Neck:     Thyroid: Thyroid normal.     Vascular: JVD normal.  Pulmonary:     Effort: Pulmonary effort is normal.     Breath sounds: No wheezing. No rales.  Cardiovascular:     Normal rate. Regular rhythm. Normal S1. Normal S2.     Murmurs: There is no murmur.  Edema:    Pretibial: bilateral 1+ edema of the pretibial area. Abdominal:     Palpations: Abdomen is soft. There is no hepatomegaly.  Skin:    General: Skin is warm and dry.  Neurological:     Mental Status: Alert and oriented to person, place and time.     Cranial Nerves: Cranial nerves are intact.      ASSESSMENT & PLAN:    1. Preoperative cardiovascular examination Her risk of perioperative major cardiac event is low at 0.4%.  She is clearly able to achieve >4 METs without difficulty.  She does not have any unstable cardiac conditions.  Therefore, according to [facility] and AHA guidelines, she  does not require further testing.  She may proceed with her surgery at acceptable risk.  2. Mild CAD She is not having any anginal symptoms.  She is on aspirin mainly due to a history of pulmonary embolism.  Continue current therapy.  3. History of pulmonary embolism She was previously on Coumadin.  It is suspected that her pulmonary embolism was related to tamoxifen therapy.  Her PCP manages this.  Coumadin was changed to aspirin 325 mg daily about 1 year ago.  Recommendations regarding whether or not to hold aspirin for her upcoming surgery should come from primary care.   Dispo:  Return in about 2 years (around 10/26/2021) for Routine Follow Up with Dr. Meda Coffee, in person.   Medication Adjustments/Labs and Tests Ordered: Current medicines are reviewed at length with the patient today.  Concerns regarding medicines are outlined above.  Tests Ordered: Orders Placed This Encounter  Procedures  . EKG 12-Lead   Medication Changes: No orders of the defined types were placed in this encounter.   Signed, Richardson Dopp, PA-C  10/27/2019 10:07 AM    [facility] [address], [place], [place]  [address] Phone: [phone]; Fax: [phone]

## 2019-10-27 ENCOUNTER — Encounter: Payer: Self-pay | Admitting: Physician Assistant

## 2019-10-27 ENCOUNTER — Other Ambulatory Visit: Payer: Self-pay

## 2019-10-27 ENCOUNTER — Ambulatory Visit (INDEPENDENT_AMBULATORY_CARE_PROVIDER_SITE_OTHER): Payer: Medicare Other | Admitting: Physician Assistant

## 2019-10-27 VITALS — BP 102/54 | HR 62 | Ht 68.0 in | Wt 212.8 lb

## 2019-10-27 DIAGNOSIS — I2510 Atherosclerotic heart disease of native coronary artery without angina pectoris: Secondary | ICD-10-CM

## 2019-10-27 DIAGNOSIS — Z01810 Encounter for preprocedural cardiovascular examination: Secondary | ICD-10-CM | POA: Diagnosis not present

## 2019-10-27 DIAGNOSIS — Z86711 Personal history of pulmonary embolism: Secondary | ICD-10-CM | POA: Diagnosis not present

## 2019-10-27 NOTE — Patient Instructions (Addendum)
Medication Instructions:  Your physician recommends that you continue on your current medications as directed. Please refer to the Current Medication list given to you today.  *If you need a refill on your cardiac medications before your next appointment, please call your pharmacy*  Lab Work:  None ordered today  Testing/Procedures:  None ordered today  Follow-Up: At [facility], you and your health needs are our priority.  As part of our continuing mission to provide you with exceptional heart care, we have created designated Provider Care Teams.  These Care Teams include your primary Cardiologist (physician) and Advanced Practice Providers (APPs -  Physician Assistants and Nurse Practitioners) who all work together to provide you with the care you need, when you need it.  We recommend signing up for the patient portal called "MyChart".  Sign up information is provided on this After Visit Summary.  MyChart is used to connect with patients for Virtual Visits (Telemedicine).  Patients are able to view lab/test results, encounter notes, upcoming appointments, etc.  Non-urgent messages can be sent to your provider as well.   To learn more about what you can do with MyChart, go to [email or web address].    Your next appointment:   2 year(s)  The format for your next appointment:   In Person  Provider:   Ena Dawley, MD

## 2019-10-27 NOTE — Addendum Note (Signed)
Addended byKathlen Mody, Nicki Reaper T on: 10/27/2019 10:08 AM   Modules accepted: Orders

## 2019-11-06 ENCOUNTER — Ambulatory Visit: Payer: Medicare Other | Admitting: Cardiology

## 2019-11-10 ENCOUNTER — Encounter (HOSPITAL_COMMUNITY): Payer: Self-pay

## 2019-11-10 NOTE — Patient Instructions (Addendum)
DUE TO COVID-19 ONLY ONE VISITOR ARE ALLOWED TO COME WITH YOU AND STAY IN THE WAITING ROOM ONLY DURING PRE OP AND PROCEDURE. THEN TWO VISITORS MAY VISIT WITH YOU IN YOUR PRIVATE ROOM DURING VISITING HOURS ONLY!!   COVID SWAB TESTING MUST BE COMPLETED ON: Saturday Nov 18, 2019 at  10:00 AM   [address], [facility] line (Must self quarantine after testing. Follow instructions on handout.)             Your procedure is scheduled on: Wednesday, Nov 22, 2019   Report to [facility] Main  Entrance    Report to admitting at 9:00 AM   Call this number if you have problems the morning of surgery [phone]   Bring CPAP mask and tubing day of surgery   Do not eat food:After Midnight.   May have liquids until 8:30 AM day of surgery   CLEAR LIQUID DIET  Foods Allowed                                                                     Foods Excluded  Water, Black Coffee and tea, regular and decaf                             liquids that you cannot  Plain Jell-O in any flavor  (No red)                                           see through such as: Fruit ices (not with fruit pulp)                                     milk, soups, orange juice  Iced Popsicles (No red)                                    All solid food Carbonated beverages, regular and diet                                    Apple juices Sports drinks like Gatorade (No red) Lightly seasoned clear broth or consume(fat free) Sugar, honey syrup  Sample Menu Breakfast                                Lunch                                     Supper Cranberry juice                    Beef broth  Chicken broth Jell-O                                     Grape juice                           Apple juice Coffee or tea                        Jell-O                                      Popsicle                                                 Coffee or tea                        Coffee or tea   Complete one Ensure drink the morning of surgery at  8:30 AM the day of surgery.  Oral Hygiene is also important to reduce your risk of infection.                                    Remember - BRUSH YOUR TEETH THE MORNING OF SURGERY WITH YOUR REGULAR TOOTHPASTE   Do NOT smoke after Midnight   Take these medicines the morning of surgery with A SIP OF WATER: Allopurinol, Zyrtec, Montelukast, Pantoprazole   May use eyedrops day of surgery   Bring Asthma Inhaler day of surgery                               You may not have any metal on your body including hair pins, jewelry, and body piercings             Do not wear make-up, lotions, powders, perfumes/cologne, or deodorant             Do not wear nail polish.  Do not shave  48 hours prior to surgery.               Do not bring valuables to the hospital. [place].   Contacts, dentures or bridgework may not be worn into surgery.   Bring small overnight bag day of surgery.    Patients discharged the day of surgery will not be allowed to drive home.   Special Instructions: Bring a copy of your healthcare power of attorney and living will documents         the day of surgery if you haven't scanned them in before.              Please read over the following fact sheets you were given: IF YOU HAVE QUESTIONS ABOUT YOUR PRE OP INSTRUCTIONS PLEASE CALL [facility]- Preparing for Total Shoulder Arthroplasty    Before surgery, you can play an important role. Because skin is not sterile, your skin  needs to be as free of germs as possible. You can reduce the number of germs on your skin by using the following products. . Benzoyl Peroxide Gel o Reduces the number of germs present on the skin o Applied twice a day to shoulder area starting two days before surgery     ==================================================================  Please follow these instructions carefully:  BENZOYL PEROXIDE 5% GEL  Please do not use if you have an allergy to benzoyl peroxide.   If your skin becomes reddened/irritated stop using the benzoyl peroxide.  Starting two days before surgery, apply as follows:  (Monday and Tuesday) 1. Apply benzoyl peroxide in the morning and at night. Apply after taking a shower. If you are not taking a shower clean entire shoulder front, back, and side along with the armpit with a clean wet washcloth.  2. Place a quarter-sized dollop on your shoulder and rub in thoroughly, making sure to cover the front, back, and side of your shoulder, along with the armpit.   2 days before ____ AM   ____ PM              1 day before ____ AM   ____ PM                         3. Do this twice a day for two days.  (Last application is the night before surgery, AFTER using the CHG soap as described below).  4. Do NOT apply benzoyl peroxide gel on the day of surgery.  [place] - Preparing for Surgery Before surgery, you can play an important role.  Because skin is not sterile, your skin needs to be as free of germs as possible.  You can reduce the number of germs on your skin by washing with CHG (chlorahexidine gluconate) soap before surgery.  CHG is an antiseptic cleaner which kills germs and bonds with the skin to continue killing germs even after washing. Please DO NOT use if you have an allergy to CHG or antibacterial soaps.  If your skin becomes reddened/irritated stop using the CHG and inform your nurse when you arrive at Short Stay. Do not shave (including legs and underarms) for at least 48 hours prior to the first CHG shower.  You may shave your face/neck.  Please follow these instructions carefully:  1.  Shower with CHG Soap the night before surgery and the  morning of surgery.  2.  If you choose to wash your hair, wash your hair first as  usual with your normal  shampoo.  3.  After you shampoo, rinse your hair and body thoroughly to remove the shampoo.                             4.  Use CHG as you would any other liquid soap.  You can apply chg directly to the skin and wash.  Gently with a scrungie or clean washcloth.  5.  Apply the CHG Soap to your body ONLY FROM THE NECK DOWN.   Do   not use on face/ open                           Wound or open sores. Avoid contact with eyes, ears mouth and   genitals (private parts).  Wash face,  Genitals (private parts) with your normal soap.             6.  Wash thoroughly, paying special attention to the area where your    surgery  will be performed.  7.  Thoroughly rinse your body with warm water from the neck down.  8.  DO NOT shower/wash with your normal soap after using and rinsing off the CHG Soap.                9.  Pat yourself dry with a clean towel.            10.  Wear clean pajamas.            11.  Place clean sheets on your bed the night of your first shower and do not  sleep with pets. Day of Surgery : Do not apply any lotions/deodorants the morning of surgery.  Please wear clean clothes to the hospital/surgery center.  FAILURE TO FOLLOW THESE INSTRUCTIONS MAY RESULT IN THE CANCELLATION OF YOUR SURGERY  PATIENT SIGNATURE_________________________________  NURSE SIGNATURE__________________________________  ________________________________________________________________________   Adam Phenix  An incentive spirometer is a tool that can help keep your lungs clear and active. This tool measures how well you are filling your lungs with each breath. Taking long deep breaths may help reverse or decrease the chance of developing breathing (pulmonary) problems (especially infection) following:  A long period of time when you are unable to move or be active. BEFORE THE PROCEDURE   If the spirometer includes an indicator to show your best effort,  your nurse or respiratory therapist will set it to a desired goal.  If possible, sit up straight or lean slightly forward. Try not to slouch.  Hold the incentive spirometer in an upright position. INSTRUCTIONS FOR USE  1. Sit on the edge of your bed if possible, or sit up as far as you can in bed or on a chair. 2. Hold the incentive spirometer in an upright position. 3. Breathe out normally. 4. Place the mouthpiece in your mouth and seal your lips tightly around it. 5. Breathe in slowly and as deeply as possible, raising the piston or the ball toward the top of the column. 6. Hold your breath for 3-5 seconds or for as long as possible. Allow the piston or ball to fall to the bottom of the column. 7. Remove the mouthpiece from your mouth and breathe out normally. 8. Rest for a few seconds and repeat Steps 1 through 7 at least 10 times every 1-2 hours when you are awake. Take your time and take a few normal breaths between deep breaths. 9. The spirometer may include an indicator to show your best effort. Use the indicator as a goal to work toward during each repetition. 10. After each set of 10 deep breaths, practice coughing to be sure your lungs are clear. If you have an incision (the cut made at the time of surgery), support your incision when coughing by placing a pillow or rolled up towels firmly against it. Once you are able to get out of bed, walk around indoors and cough well. You may stop using the incentive spirometer when instructed by your caregiver.  RISKS AND COMPLICATIONS  Take your time so you do not get dizzy or light-headed.  If you are in pain, you may need to take or ask for pain medication before doing incentive spirometry. It is harder to take a deep breath if you  are having pain. AFTER USE  Rest and breathe slowly and easily.  It can be helpful to keep track of a log of your progress. Your caregiver can provide you with a simple table to help with this. If you are  using the spirometer at home, follow these instructions: [place] IF:   You are having difficultly using the spirometer.  You have trouble using the spirometer as often as instructed.  Your pain medication is not giving enough relief while using the spirometer.  You develop fever of 100.5 F (38.1 C) or higher. SEEK IMMEDIATE MEDICAL CARE IF:   You cough up bloody sputum that had not been present before.  You develop fever of 102 F (38.9 C) or greater.  You develop worsening pain at or near the incision site. MAKE SURE YOU:   Understand these instructions.  Will watch your condition.  Will get help right away if you are not doing well or get worse. Document Released: 11/16/2006 Document Revised: 09/28/2011 Document Reviewed: 01/17/2007 [facility] Patient Information 2014 [place], [place].   ________________________________________________________________________

## 2019-11-15 ENCOUNTER — Encounter (HOSPITAL_COMMUNITY): Payer: Self-pay

## 2019-11-15 ENCOUNTER — Other Ambulatory Visit: Payer: Self-pay

## 2019-11-15 ENCOUNTER — Encounter (HOSPITAL_COMMUNITY)
Admission: RE | Admit: 2019-11-15 | Discharge: 2019-11-15 | Disposition: A | Discharge status: Home / Self Care | Payer: Medicare Other | Source: Ambulatory Visit | Attending: Orthopaedic Surgery | Admitting: Orthopaedic Surgery

## 2019-11-15 DIAGNOSIS — Z01812 Encounter for preprocedural laboratory examination: Secondary | ICD-10-CM | POA: Insufficient documentation

## 2019-11-15 HISTORY — DX: Other complications of anesthesia, initial encounter: T88.59XA

## 2019-11-15 HISTORY — DX: Other seasonal allergic rhinitis: J30.2

## 2019-11-15 HISTORY — DX: Varicose veins of bilateral lower extremities with other complications: I83.893

## 2019-11-15 HISTORY — DX: Lymphedema, not elsewhere classified: I89.0

## 2019-11-15 HISTORY — DX: Other intervertebral disc degeneration, lumbosacral region: M51.37

## 2019-11-15 HISTORY — DX: Other intervertebral disc degeneration, lumbosacral region without mention of lumbar back pain or lower extremity pain: M51.379

## 2019-11-15 LAB — SURGICAL PCR SCREEN
MRSA, PCR: NEGATIVE
Staphylococcus aureus: NEGATIVE

## 2019-11-15 LAB — CBC
HCT: 39.6 % (ref 36.0–46.0)
Hemoglobin: 12.8 g/dL (ref 12.0–15.0)
MCH: 33.2 pg (ref 26.0–34.0)
MCHC: 32.3 g/dL (ref 30.0–36.0)
MCV: 102.6 fL — ABNORMAL HIGH (ref 80.0–100.0)
Platelets: 243 10*3/uL (ref 150–400)
RBC: 3.86 MIL/uL — ABNORMAL LOW (ref 3.87–5.11)
RDW: 13.2 % (ref 11.5–15.5)
WBC: 8 10*3/uL (ref 4.0–10.5)
nRBC: 0 % (ref 0.0–0.2)

## 2019-11-15 LAB — BASIC METABOLIC PANEL
Anion gap: 7 (ref 5–15)
BUN: 26 mg/dL — ABNORMAL HIGH (ref 8–23)
CO2: 24 mmol/L (ref 22–32)
Calcium: 9.4 mg/dL (ref 8.9–10.3)
Chloride: 109 mmol/L (ref 98–111)
Creatinine, Ser: 0.95 mg/dL (ref 0.44–1.00)
GFR calc Af Amer: >60 mL/min (ref >60)
GFR calc non Af Amer: 58 mL/min — ABNORMAL LOW (ref >60)
Glucose, Bld: 95 mg/dL (ref 70–99)
Potassium: 4.1 mmol/L (ref 3.5–5.1)
Sodium: 140 mmol/L (ref 135–145)

## 2019-11-15 NOTE — Progress Notes (Signed)
PCP - Dr. Carren Rang clearance note 09/21/19  careeverywhere Cardiologist - Dr. Orvan Falconer P.A. clearance note 10/27/19 in epic  Chest x-ray - greater than 1 year EKG - 10/27/19 in epic Stress Test - greater than 2 years ECHO - greater than 2 years Cardiac Cath - greater than 2 years  Sleep Study - Yes CPAP - Yes  Fasting Blood Sugar - N/A Checks Blood Sugar __N/A___ times a day Hgb A1c 5.1 10/11/19  Blood Thinner Instructions: N/A Aspirin Instructions: yes Last Dose: 11/14/19  Anesthesia review: OSA  Patient denies shortness of breath, fever, cough and chest pain at PAT appointment   Patient verbalized understanding of instructions that were given to them at the PAT appointment. Patient was also instructed that they will need to review over the PAT instructions again at home before surgery.

## 2019-11-17 NOTE — Progress Notes (Signed)
Anesthesia Chart Review   Case: [id number] Date/Time: 11/22/19 1120   Procedure: REVERSE SHOULDER ARTHROPLASTY (Right Shoulder)   Anesthesia type: Choice   Pre-op diagnosis: DEGENERATIVE JOINT DISEASE  right shoulder   Location: [facility] ROOM 06 / [facility]   Surgeons: Hiram Gash, MD      DISCUSSION:78 y.o. former smoker (quit 12/06/68) with h/o GERD, asthma, breast cancer, PE (following chemo), mild nonobstructive CAD on cardiac cath 10/2014, OSA on CPAP, right shoulder djd scheduled for above procedure 11/22/2019 with Dr. Ophelia Charter.   Pt last seen by cardiology 10/27/2019 for preoperative evaluation.  Per OV note, "Her risk of perioperative major cardiac event is low at 0.4%.  She is clearly able to achieve >4 METs without difficulty.  She does not have any unstable cardiac conditions.  Therefore, according to [facility] and AHA guidelines, she does not require further testing.  She may proceed with her surgery at acceptable risk."  Anticipate pt can proceed with planned procedure barring acute status change.   VS: BP (!) 159/84   Pulse 63   Temp 36.8 C (Oral)   Resp 16   Ht 5\' 8"  (1.727 m)   Wt 97.1 kg   LMP  (LMP Unknown)   SpO2 100%   BMI 32.54 kg/m   PROVIDERS: Galen Manila, MD is PCP   Ena Dawley, MD is Cardiologist  LABS: Labs reviewed: Acceptable for surgery. (all labs ordered are listed, but only abnormal results are displayed)  Labs Reviewed  BASIC METABOLIC PANEL - Abnormal; Notable for the following components:      Result Value   BUN 26 (*)    GFR calc non Af Amer 58 (*)    All other components within normal limits  CBC - Abnormal; Notable for the following components:   RBC 3.86 (*)    MCV 102.6 (*)    All other components within normal limits  SURGICAL PCR SCREEN     IMAGES:   EKG: 10/27/2019 Rate 62 bpm  NSR LAD  CV: Myocardial Perfusion 11/10/2016 IMPRESSION: 1. No evidence of reversible ischemia. There is a moderate fixed defect in the basilar and  mid ventricular septal wall.  2. Normal left ventricular wall motion.  3. Left ventricular ejection fraction 63%  4. Non invasive risk stratification*: Low   Echo 10/22/2014 Study Conclusions   - Left ventricle: The cavity size was normal. Systolic function was  normal. The estimated ejection fraction was in the range of 55%  to 60%. Wall motion was normal; there were no regional wall  motion abnormalities.  - Mitral valve: There was trivial regurgitation.  - Pulmonic valve: There was trivial regurgitation.  Past Medical History:  Diagnosis Date  . Anxiety   . Asthma   . Blood transfusion 07/2008   "related to chemo" (07/25/2012)  . Breast cancer ([place])    breast lft  . Chronic edema   . Chronic lower back pain   . Clotting disorder ([place])    "sudden chest pain; found multiple blood clots in both lungs; they think it was from Tamoxifen" (07/25/2012)  . Complication of anesthesia    Hard to wake up  . DDD (degenerative disc disease), lumbosacral   . Fibromyalgia   . GERD (gastroesophageal reflux disease)   . Gout   . H/O hiatal hernia   . Heart murmur   . Hx of blood clots 02/2011   "sudden chest pain; found multiple blood clots in both lungs; they think it was from  Tamoxifen; haven't had any since 11/2011" (07/25/2012)  . Hypertension    pt denies  . Left knee DJD   . Lymph edema    Left arm after mastectomy  . Mild CAD    pt denies  . OSA on CPAP    Moderate  . Osteoarthritis    "pretty much everywhere" (07/25/2012)  . Pulmonary embolism ([place]) 02/2011   after chemo  . Right knee DJD   . Seasonal allergies   . Seizures ([place])    "seizures vs convulsions related to PCN when I was a child" (07/25/2012)  . Subcutaneous hematoma 07/25/2012   Intraoperative cultures are pending on this fluid collection that was just outside the left knee joint.  Gram stain had few WBC monos and polys.  . Varicose veins of both legs with edema     Past Surgical History:  Procedure  Laterality Date  . BREAST BIOPSY Left 03/05/2008   malignant  . BREAST LUMPECTOMY  02/2008   "left" (07/25/2012)  . BREAST SURGERY    . KNEE ARTHROSCOPY  2001;  2009   "right; left" (07/25/2012)  . LEFT HEART CATHETERIZATION WITH CORONARY ANGIOGRAM N/A 10/26/2014   Procedure: LEFT HEART CATHETERIZATION WITH CORONARY ANGIOGRAM;  Surgeon: Burnell Blanks, MD;  Location: [facility] CATH LAB;  Service: Cardiovascular;  Laterality: N/A;  . MASTECTOMY MODIFIED RADICAL / SIMPLE / COMPLETE Left 03/28/2008   "left" (07/25/2012)  . PORTACATH PLACEMENT  03/28/2008; ?05/2008   "left; right" (07/25/2012)  . TONSILLECTOMY  1950  . TOTAL KNEE ARTHROPLASTY  12/21/2011   Procedure: TOTAL KNEE ARTHROPLASTY;  Surgeon: Lorn Junes, MD;  Location: [facility];  Service: Orthopedics;  Laterality: Right;  DR W[place]ill THIS CASE  . TOTAL KNEE ARTHROPLASTY WITH REVISION COMPONENTS  07/25/2012   Procedure: TOTAL KNEE ARTHROPLASTY WITH REVISION COMPONENTS;  Surgeon: Lorn Junes, MD;  Location: [facility];  Service: Orthopedics;  Laterality: Left;  . TUBAL LIGATION  ~ 1976  . VAGINAL HYSTERECTOMY  ~ 1983   "endometrosis" (07/25/2012)    MEDICATIONS: . acetaminophen (TYLENOL) 325 MG tablet  . acyclovir (ZOVIRAX) 400 MG tablet  . albuterol (PROAIR HFA) 108 (90 Base) MCG/ACT inhaler  . allopurinol (ZYLOPRIM) 300 MG tablet  . Ascorbic Acid (VITAMIN C) 1000 MG tablet  . aspirin 325 MG tablet  . benzonatate (TESSALON) 100 MG capsule  . Biotin 5000 MCG CAPS  . budesonide (PULMICORT) 0.5 MG/2ML nebulizer solution  . calcium carbonate (CALCIUM 500) 1250 MG tablet  . cetirizine (ZYRTEC) 10 MG tablet  . cholecalciferol (VITAMIN D) 400 UNITS TABS  . dexamethasone (DECADRON) 4 MG tablet  . dicyclomine (BENTYL) 10 MG capsule  . fluticasone (FLONASE) 50 MCG/ACT nasal spray  . guaiFENesin (MUCINEX) 600 MG 12 hr tablet  . hydroxypropyl methylcellulose (ISOPTO TEARS) 2.5 % ophthalmic solution  . Hypertonic Nasal Wash (SINUS  RINSE) PACK  . montelukast (SINGULAIR) 10 MG tablet  . nabumetone (RELAFEN) 500 MG tablet  . NON FORMULARY  . ondansetron (ZOFRAN) 4 MG tablet  . oxyCODONE-acetaminophen (PERCOCET/ROXICET) 5-325 MG tablet  . pantoprazole (PROTONIX) 40 MG tablet  . potassium chloride SA (K-DUR,KLOR-CON) 20 MEQ tablet  . traZODone (DESYREL) 100 MG tablet  . triamterene-hydrochlorothiazide (MAXZIDE) 75-50 MG tablet  . UNABLE TO FIND   No current facility-administered medications for this encounter.     Maia Plan [facility] Pre-Surgical Testing [phone] 11/17/19  10:41 AM

## 2019-11-18 ENCOUNTER — Other Ambulatory Visit (HOSPITAL_COMMUNITY)
Admission: RE | Admit: 2019-11-18 | Discharge: 2019-11-18 | Disposition: A | Discharge status: Home / Self Care | Payer: Medicare Other | Source: Ambulatory Visit | Attending: Orthopaedic Surgery | Admitting: Orthopaedic Surgery

## 2019-11-18 DIAGNOSIS — Z01812 Encounter for preprocedural laboratory examination: Secondary | ICD-10-CM | POA: Insufficient documentation

## 2019-11-18 DIAGNOSIS — Z20822 Contact with and (suspected) exposure to covid-19: Secondary | ICD-10-CM | POA: Diagnosis not present

## 2019-11-18 LAB — SARS CORONAVIRUS 2 (TAT 6-24 HRS): SARS Coronavirus 2: NEGATIVE

## 2019-11-20 NOTE — H&P (Signed)
PREOPERATIVE H&P  Chief Complaint: DEGENERATIVE JOINT DISEASE  right shoulder  HPI: Lydia Campbell is a 79 y.o. female who is scheduled for REVERSE SHOULDER ARTHROPLASTY.   Patient has a past medical history significant for GERD, asthma, breast cancer, PE (following chemo), mild nonobstructive CAD on cardiac cath 10/2014, OSA on CPAP.   Patient is a 79 year-old who fell and dislocated her right shoulder after previous falls onto the right side.  She has had pain in the shoulder for a couple of years, but has had pseudoparalysis type symptoms since September 13, 2019.    Her symptoms are rated as moderate to severe, and have been worsening.  This is significantly impairing activities of daily living.    Please see clinic note for further details on this patient's care.    She has elected for surgical management.   Past Medical History:  Diagnosis Date  . Anxiety   . Asthma   . Blood transfusion 07/2008   "related to chemo" (07/25/2012)  . Breast cancer ([place])    breast lft  . Chronic edema   . Chronic lower back pain   . Clotting disorder ([place])    "sudden chest pain; found multiple blood clots in both lungs; they think it was from Tamoxifen" (07/25/2012)  . Complication of anesthesia    Hard to wake up  . DDD (degenerative disc disease), lumbosacral   . Fibromyalgia   . GERD (gastroesophageal reflux disease)   . Gout   . H/O hiatal hernia   . Heart murmur   . Hx of blood clots 02/2011   "sudden chest pain; found multiple blood clots in both lungs; they think it was from Tamoxifen; haven't had any since 11/2011" (07/25/2012)  . Hypertension    pt denies  . Left knee DJD   . Lymph edema    Left arm after mastectomy  . Mild CAD    pt denies  . OSA on CPAP    Moderate  . Osteoarthritis    "pretty much everywhere" (07/25/2012)  . Pulmonary embolism ([place]) 02/2011   after chemo  . Right knee DJD   . Seasonal allergies   . Seizures ([place])    "seizures vs convulsions related  to PCN when I was a child" (07/25/2012)  . Subcutaneous hematoma 07/25/2012   Intraoperative cultures are pending on this fluid collection that was just outside the left knee joint.  Gram stain had few WBC monos and polys.  . Varicose veins of both legs with edema    Past Surgical History:  Procedure Laterality Date  . BREAST BIOPSY Left 03/05/2008   malignant  . BREAST LUMPECTOMY  02/2008   "left" (07/25/2012)  . BREAST SURGERY    . KNEE ARTHROSCOPY  2001;  2009   "right; left" (07/25/2012)  . LEFT HEART CATHETERIZATION WITH CORONARY ANGIOGRAM N/A 10/26/2014   Procedure: LEFT HEART CATHETERIZATION WITH CORONARY ANGIOGRAM;  Surgeon: Burnell Blanks, MD;  Location[facility] CATH LAB;  Service: Cardiovascular;  Laterality: N/A;  . MASTECTOMY MODIFIED RADICAL / SIMPLE / COMPLETE Left 03/28/2008   "left" (07/25/2012)  . PORTACATH PLACEMENT  03/28/2008; ?05/2008   "left; right" (07/25/2012)  . TONSILLECTOMY  1950  . TOTAL KNEE ARTHROPLASTY  12/21/2011   Procedure: TOTAL KNEE ARTHROPLASTY;  Surgeon: Lorn Junes, MD;  Location: [place];  Service: Orthopedics;  Laterality: Right;  DR Orland THIS CASE  . TOTAL KNEE ARTHROPLASTY WITH REVISION COMPONENTS  07/25/2012  Procedure: TOTAL KNEE ARTHROPLASTY WITH REVISION COMPONENTS;  Surgeon: Lorn Junes, MD;  Location: [place];  Service: Orthopedics;  Laterality: Left;  . TUBAL LIGATION  ~ 1976  . VAGINAL HYSTERECTOMY  ~ 1983   "endometrosis" (07/25/2012)   Social History   Socioeconomic History  . Marital status: Married    Spouse name: Not on file  . Number of children: 1  . Years of education: Not on file  . Highest education level: Not on file  Occupational History  . Occupation: retired  Tobacco Use  . Smoking status: Former Smoker    Packs/day: 1.50    Years: 5.00    Pack years: 7.50    Types: Cigarettes    Quit date: 12/06/1968    Years since quitting: 50.9  . Smokeless tobacco: Never Used  Substance and Sexual Activity  .  Alcohol use: No    Comment: 07/25/2012 "bottle of champaign on our anniversary q yr; add a drink once in a blue moon for a celebration"  . Drug use: No  . Sexual activity: Yes    Partners: Male    Birth control/protection: Surgical  Other Topics Concern  . Not on file  Social History Narrative  . Not on file   Social Determinants of Health   Financial Resource Strain:   . Difficulty of Paying Living Expenses:   Food Insecurity:   . Worried About Charity fundraiser in the Last Year:   . Arboriculturist in the Last Year:   Transportation Needs:   . Film/video editor (Medical):   Marland Kitchen Lack of Transportation (Non-Medical):   Physical Activity:   . Days of Exercise per Week:   . Minutes of Exercise per Session:   Stress:   . Feeling of Stress :   Social Connections:   . Frequency of Communication with Friends and Family:   . Frequency of Social Gatherings with Friends and Family:   . Attends Religious Services:   . Active Member of Clubs or Organizations:   . Attends Archivist Meetings:   Marland Kitchen Marital Status:    Family History  Problem Relation Age of Onset  . Heart disease Mother   . Diabetes Father   . Heart disease Father   . CVA Father   . Colon polyps Sister        adenomatous  . Heart disease Sister   . Heart disease Brother   . Breast cancer Cousin        pat side, mat and pat aunts  . Colon cancer Neg Hx    Allergies  Allergen Reactions  . Clarithromycin Itching  . Morphine And Related Nausea Only       . Penicillins Nausea And Vomiting and Other (See Comments)    seizures & convulsions; passed out; last time was when pt was 20  Has patient had a PCN reaction causing immediate rash, facial/tongue/throat swelling, SOB or lightheadedness with hypotension: Yes Has patient had a PCN reaction causing severe rash involving mucus membranes or skin necrosis: No Has patient had a PCN reaction that required hospitalization: No Has patient had a PCN  reaction occurring within the last 10 years: No If all of the above answers are "NO", then m  . Tape Other (See Comments)    PLEASE USE COBAN WRAP; TAPE WILL TEAR OFF THE PATENT'S SKIN!!   Prior to Admission medications   Medication Sig Start Date End Date Taking? Authorizing Provider  acetaminophen (TYLENOL)  325 MG tablet Take 650 mg by mouth every 6 (six) hours as needed for mild pain.   Yes [provider]  acyclovir (ZOVIRAX) 400 MG tablet Take 400 mg by mouth 3 (three) times daily as needed (fever blisters).  10/12/16  Yes [provider]  albuterol (PROAIR HFA) 108 (90 Base) MCG/ACT inhaler Inhale 2 puffs into the lungs daily as needed for wheezing or shortness of breath.   Yes [provider]  allopurinol (ZYLOPRIM) 300 MG tablet Take 300 mg by mouth daily.    Yes [provider]  Ascorbic Acid (VITAMIN C) 1000 MG tablet Take 1,000 mg by mouth daily.   Yes [provider]  benzonatate (TESSALON) 100 MG capsule Take 100 mg by mouth 3 (three) times daily as needed for cough.  04/16/15  Yes [provider]  Biotin 5000 MCG CAPS Take 1 capsule by mouth daily.    Yes [provider]  budesonide (PULMICORT) 0.5 MG/2ML nebulizer solution Take 0.5 mg by nebulization 2 (two) times daily.   Yes [provider]  calcium carbonate (CALCIUM 500) 1250 MG tablet Take 1,250 mg by mouth daily.    Yes [provider]  cetirizine (ZYRTEC) 10 MG tablet Take 10 mg by mouth daily.    Yes [provider]  cholecalciferol (VITAMIN D) 400 UNITS TABS Take 400 Units by mouth daily.    Yes [provider]  dexamethasone (DECADRON) 4 MG tablet Take 4 mg by mouth 2 (two) times daily as needed (pain).    Yes [provider]  dicyclomine (BENTYL) 10 MG capsule Take 10 mg by mouth 4 (four) times daily as needed for spasms.   Yes [provider]  fluticasone (FLONASE) 50 MCG/ACT nasal spray Place 1 spray into  both nostrils 2 (two) times daily.   Yes [provider]  guaiFENesin (MUCINEX) 600 MG 12 hr tablet Take 600 mg by mouth daily.   Yes [provider]  hydroxypropyl methylcellulose (ISOPTO TEARS) 2.5 % ophthalmic solution Place 1 drop into both eyes 4 (four) times daily as needed for dry eyes.    Yes [provider]  Hypertonic Nasal Wash (SINUS RINSE) PACK Place 1 each into the nose daily.    Yes [provider]  montelukast (SINGULAIR) 10 MG tablet Take 10 mg by mouth daily. 06/01/18  Yes [provider]  nabumetone (RELAFEN) 500 MG tablet Take 500 mg by mouth 2 (two) times daily as needed for moderate pain.  05/03/13  Yes [provider]  NON FORMULARY CPAP machine at bedtime   Yes [provider]  ondansetron (ZOFRAN) 4 MG tablet Take 4 mg by mouth as needed for nausea or vomiting.   Yes [provider]  pantoprazole (PROTONIX) 40 MG tablet Take 40 mg by mouth daily before breakfast. 10/12/16  Yes [provider]  potassium chloride SA (K-DUR,KLOR-CON) 20 MEQ tablet Take 20 mEq by mouth 2 (two) times daily. 10/12/16  Yes [provider]  traZODone (DESYREL) 100 MG tablet Take 50-100 mg by mouth at bedtime as needed for sleep.   Yes [provider]  triamterene-hydrochlorothiazide (MAXZIDE) 75-50 MG tablet Take 1 tablet by mouth 2 (two) times daily.   Yes [provider]  aspirin 325 MG tablet Take 325 mg by mouth daily.     [provider]  oxyCODONE-acetaminophen (PERCOCET/ROXICET) 5-325 MG tablet Take 1 tablet by mouth every 4 (four) hours as needed for severe pain. Patient not taking: Reported  on 11/07/2019 06/02/18   Virgel Manifold, MD  UNABLE TO FIND Rx: [place] (Quantity: 6) Dx: 174.9; Left Mastectomy 04/18/13   Erroll Luna, MD    ROS: All other systems have been reviewed and were otherwise negative with the exception of those mentioned in the HPI and as  above.  Physical Exam: General: Alert, no acute distress Cardiovascular: No pedal edema Respiratory: No cyanosis, no use of accessory musculature GI: No organomegaly, abdomen is soft and non-tender Skin: No lesions in the area of chief complaint Neurologic: Sensation intact distally Psychiatric: Patient is competent for consent with normal mood and affect Lymphatic: No axillary or cervical lymphadenopathy  MUSCULOSKELETAL:  Right upper extremity: Range of motion is extraordinarily limited on the right side.  Passively to 70 degrees with some crepitus during motion.  Actively she has essentially 10 degrees of forward elevation and pseudoparalysis.  The axillary nerve function is very difficult to test.  Her sensory function is intact.  We do seem to have a flicker of deltoid function, but it is obviously very different than the contralateral side.  Cuff strength is essentially unable to be tested secondary to weakness and pain on the right side.    Imaging: Reviewed MRI and x-rays which demonstrate rotator cuff arthropathy.  Lester Brooks with proximal humeral migration and irreparable cuff tear.  Assessment: Rotator cuff arthropathy.    Plan: Plan for Procedure(s): REVERSE SHOULDER ARTHROPLASTY  Dr. Griffin Basil and the patient talked about the risks, benefits and alternatives of a reverse total shoulder arthroplasty, especially in the setting of increased risk of dislocation with the possibility of axillary nerve injury.    The risks benefits and alternatives were discussed with the patient including but not limited to the risks of nonoperative treatment, versus surgical intervention including infection, bleeding, nerve injury,  blood clots, cardiopulmonary complications, morbidity, mortality, among others, and they were willing to proceed.   We additionally specifically discussed risks of axillary nerve injury, infection, periprosthetic fracture, continued pain and longevity of implants prior to  beginning procedure.   The patient acknowledged the explanation, agreed to proceed with the plan and consent was signed.   Patient will be closely monitored in the PACU for medical stabilization. Patient will likely be admitted for observation after surgery for pain control and medical stabilization due to her age and multiple co-morbidies. However due to COVID-19 pandemic, patient may be discharged home from PACU if found to be stable and safe for discharge. Patient and her family are in agreement with this.   Patient has been cleared for surgery by her PCP, Dr. Bobby Rumpf, and cardiologist, Dr. Meda Coffee. Patient has a history of PE, suspected related to tamoxifen therapy. She was previously on Coumadin - this was changed to aspirin 325 mg daily one year. Her PCP, Dr. Bobby Rumpf, manages this.   Operative Plan: Right reverse total shoulder arthroplasty  Discharge Medications: Tylenol, Oxycodone, Zofran DVT Prophylaxis: Xarelto versus Lovenox due to patient's history of PE Physical Therapy: Outpatient PT Special Discharge needs: [place], PA-C  11/20/2019 5:10 PM

## 2019-11-22 ENCOUNTER — Other Ambulatory Visit: Payer: Self-pay

## 2019-11-22 ENCOUNTER — Ambulatory Visit (HOSPITAL_COMMUNITY): Payer: Medicare Other | Admitting: Physician Assistant

## 2019-11-22 ENCOUNTER — Encounter (HOSPITAL_COMMUNITY): Payer: Self-pay | Admitting: Orthopaedic Surgery

## 2019-11-22 ENCOUNTER — Encounter (HOSPITAL_COMMUNITY)
Admission: RE | Disposition: A | Discharge status: Home / Self Care | Payer: Self-pay | Source: Home / Self Care | Attending: Orthopaedic Surgery

## 2019-11-22 ENCOUNTER — Ambulatory Visit (HOSPITAL_COMMUNITY): Payer: Medicare Other | Admitting: Anesthesiology

## 2019-11-22 ENCOUNTER — Ambulatory Visit (HOSPITAL_COMMUNITY)
Admission: RE | Admit: 2019-11-22 | Discharge: 2019-11-23 | Disposition: A | Discharge status: Home / Self Care | Payer: Medicare Other | Attending: Orthopaedic Surgery | Admitting: Orthopaedic Surgery

## 2019-11-22 ENCOUNTER — Ambulatory Visit (HOSPITAL_COMMUNITY): Payer: Medicare Other

## 2019-11-22 DIAGNOSIS — Z8371 Family history of colonic polyps: Secondary | ICD-10-CM | POA: Insufficient documentation

## 2019-11-22 DIAGNOSIS — R569 Unspecified convulsions: Secondary | ICD-10-CM | POA: Diagnosis not present

## 2019-11-22 DIAGNOSIS — Z91048 Other nonmedicinal substance allergy status: Secondary | ICD-10-CM | POA: Insufficient documentation

## 2019-11-22 DIAGNOSIS — Z791 Long term (current) use of non-steroidal anti-inflammatories (NSAID): Secondary | ICD-10-CM | POA: Insufficient documentation

## 2019-11-22 DIAGNOSIS — Z885 Allergy status to narcotic agent status: Secondary | ICD-10-CM | POA: Insufficient documentation

## 2019-11-22 DIAGNOSIS — M19011 Primary osteoarthritis, right shoulder: Secondary | ICD-10-CM | POA: Diagnosis present

## 2019-11-22 DIAGNOSIS — J45909 Unspecified asthma, uncomplicated: Secondary | ICD-10-CM | POA: Diagnosis not present

## 2019-11-22 DIAGNOSIS — Z09 Encounter for follow-up examination after completed treatment for conditions other than malignant neoplasm: Secondary | ICD-10-CM

## 2019-11-22 DIAGNOSIS — M75121 Complete rotator cuff tear or rupture of right shoulder, not specified as traumatic: Secondary | ICD-10-CM | POA: Insufficient documentation

## 2019-11-22 DIAGNOSIS — Z7982 Long term (current) use of aspirin: Secondary | ICD-10-CM | POA: Insufficient documentation

## 2019-11-22 DIAGNOSIS — M797 Fibromyalgia: Secondary | ICD-10-CM | POA: Diagnosis not present

## 2019-11-22 DIAGNOSIS — Z6832 Body mass index (BMI) 32.0-32.9, adult: Secondary | ICD-10-CM | POA: Insufficient documentation

## 2019-11-22 DIAGNOSIS — R011 Cardiac murmur, unspecified: Secondary | ICD-10-CM | POA: Diagnosis not present

## 2019-11-22 DIAGNOSIS — I10 Essential (primary) hypertension: Secondary | ICD-10-CM | POA: Diagnosis not present

## 2019-11-22 DIAGNOSIS — Z86711 Personal history of pulmonary embolism: Secondary | ICD-10-CM | POA: Diagnosis not present

## 2019-11-22 DIAGNOSIS — K219 Gastro-esophageal reflux disease without esophagitis: Secondary | ICD-10-CM | POA: Diagnosis not present

## 2019-11-22 DIAGNOSIS — M5137 Other intervertebral disc degeneration, lumbosacral region: Secondary | ICD-10-CM | POA: Diagnosis not present

## 2019-11-22 DIAGNOSIS — Z833 Family history of diabetes mellitus: Secondary | ICD-10-CM | POA: Insufficient documentation

## 2019-11-22 DIAGNOSIS — K449 Diaphragmatic hernia without obstruction or gangrene: Secondary | ICD-10-CM | POA: Insufficient documentation

## 2019-11-22 DIAGNOSIS — Z87891 Personal history of nicotine dependence: Secondary | ICD-10-CM | POA: Diagnosis not present

## 2019-11-22 DIAGNOSIS — F419 Anxiety disorder, unspecified: Secondary | ICD-10-CM | POA: Insufficient documentation

## 2019-11-22 DIAGNOSIS — I8393 Asymptomatic varicose veins of bilateral lower extremities: Secondary | ICD-10-CM | POA: Diagnosis not present

## 2019-11-22 DIAGNOSIS — Z90710 Acquired absence of both cervix and uterus: Secondary | ICD-10-CM | POA: Insufficient documentation

## 2019-11-22 DIAGNOSIS — G8929 Other chronic pain: Secondary | ICD-10-CM | POA: Diagnosis not present

## 2019-11-22 DIAGNOSIS — G4733 Obstructive sleep apnea (adult) (pediatric): Secondary | ICD-10-CM | POA: Insufficient documentation

## 2019-11-22 DIAGNOSIS — Z9221 Personal history of antineoplastic chemotherapy: Secondary | ICD-10-CM | POA: Diagnosis not present

## 2019-11-22 DIAGNOSIS — Z881 Allergy status to other antibiotic agents status: Secondary | ICD-10-CM | POA: Insufficient documentation

## 2019-11-22 DIAGNOSIS — Z96653 Presence of artificial knee joint, bilateral: Secondary | ICD-10-CM | POA: Insufficient documentation

## 2019-11-22 DIAGNOSIS — Z7901 Long term (current) use of anticoagulants: Secondary | ICD-10-CM | POA: Insufficient documentation

## 2019-11-22 DIAGNOSIS — Z853 Personal history of malignant neoplasm of breast: Secondary | ICD-10-CM | POA: Insufficient documentation

## 2019-11-22 DIAGNOSIS — E669 Obesity, unspecified: Secondary | ICD-10-CM | POA: Insufficient documentation

## 2019-11-22 DIAGNOSIS — M545 Low back pain: Secondary | ICD-10-CM | POA: Insufficient documentation

## 2019-11-22 DIAGNOSIS — Z79899 Other long term (current) drug therapy: Secondary | ICD-10-CM | POA: Insufficient documentation

## 2019-11-22 DIAGNOSIS — I2510 Atherosclerotic heart disease of native coronary artery without angina pectoris: Secondary | ICD-10-CM | POA: Diagnosis not present

## 2019-11-22 DIAGNOSIS — Z888 Allergy status to other drugs, medicaments and biological substances status: Secondary | ICD-10-CM | POA: Insufficient documentation

## 2019-11-22 DIAGNOSIS — M109 Gout, unspecified: Secondary | ICD-10-CM | POA: Diagnosis not present

## 2019-11-22 DIAGNOSIS — Z823 Family history of stroke: Secondary | ICD-10-CM | POA: Insufficient documentation

## 2019-11-22 DIAGNOSIS — Z7952 Long term (current) use of systemic steroids: Secondary | ICD-10-CM | POA: Insufficient documentation

## 2019-11-22 DIAGNOSIS — Z880 Allergy status to penicillin: Secondary | ICD-10-CM | POA: Insufficient documentation

## 2019-11-22 DIAGNOSIS — Z7951 Long term (current) use of inhaled steroids: Secondary | ICD-10-CM | POA: Insufficient documentation

## 2019-11-22 DIAGNOSIS — Z8249 Family history of ischemic heart disease and other diseases of the circulatory system: Secondary | ICD-10-CM | POA: Insufficient documentation

## 2019-11-22 DIAGNOSIS — M1990 Unspecified osteoarthritis, unspecified site: Secondary | ICD-10-CM | POA: Diagnosis not present

## 2019-11-22 DIAGNOSIS — Z803 Family history of malignant neoplasm of breast: Secondary | ICD-10-CM | POA: Insufficient documentation

## 2019-11-22 HISTORY — PX: REVERSE SHOULDER ARTHROPLASTY: SHX5054

## 2019-11-22 SURGERY — ARTHROPLASTY, SHOULDER, TOTAL, REVERSE
Anesthesia: General | Site: Shoulder | Laterality: Right

## 2019-11-22 MED ORDER — MEPERIDINE HCL 50 MG/ML IJ SOLN: 6.2500 mg | INTRAMUSCULAR | Status: DC | PRN

## 2019-11-22 MED ORDER — ROCURONIUM BROMIDE 10 MG/ML (PF) SYRINGE
PREFILLED_SYRINGE | INTRAVENOUS | Status: AC
  Filled 2019-11-22: qty 10

## 2019-11-22 MED ORDER — ACETAMINOPHEN 10 MG/ML IV SOLN: 1000.0000 mg | Freq: Once | INTRAVENOUS | Status: DC | PRN

## 2019-11-22 MED ORDER — LACTATED RINGERS IV SOLN: INTRAVENOUS | Status: DC

## 2019-11-22 MED ORDER — POLYETHYLENE GLYCOL 3350 17 G PO PACK: 17.0000 g | PACK | Freq: Every day | ORAL | Status: DC | PRN

## 2019-11-22 MED ORDER — DOCUSATE SODIUM 100 MG PO CAPS
100.0000 mg | ORAL_CAPSULE | Freq: Two times a day (BID) | ORAL | Status: DC
  Administered 2019-11-22 – 2019-11-23 (×2): 100 mg via ORAL
  Filled 2019-11-22 (×2): qty 1

## 2019-11-22 MED ORDER — OXYCODONE HCL 5 MG PO TABS: 5.0000 mg | ORAL_TABLET | ORAL | Status: DC | PRN

## 2019-11-22 MED ORDER — HYPROMELLOSE (GONIOSCOPIC) 2.5 % OP SOLN: 1.0000 [drp] | Freq: Four times a day (QID) | OPHTHALMIC | Status: DC | PRN

## 2019-11-22 MED ORDER — PROPOFOL 10 MG/ML IV BOLUS
INTRAVENOUS | Status: DC | PRN
  Administered 2019-11-22: 160 mg via INTRAVENOUS

## 2019-11-22 MED ORDER — FENTANYL CITRATE (PF) 100 MCG/2ML IJ SOLN
INTRAMUSCULAR | Status: AC
  Filled 2019-11-22: qty 2

## 2019-11-22 MED ORDER — POLYVINYL ALCOHOL 1.4 % OP SOLN: 1.0000 [drp] | Freq: Four times a day (QID) | OPHTHALMIC | Status: DC | PRN

## 2019-11-22 MED ORDER — CELECOXIB 200 MG PO CAPS
200.0000 mg | ORAL_CAPSULE | Freq: Two times a day (BID) | ORAL | Status: DC
  Administered 2019-11-22 – 2019-11-23 (×2): 200 mg via ORAL
  Filled 2019-11-22 (×2): qty 1

## 2019-11-22 MED ORDER — MENTHOL 3 MG MT LOZG: 1.0000 | LOZENGE | OROMUCOSAL | Status: DC | PRN

## 2019-11-22 MED ORDER — TRIAMTERENE-HCTZ 75-50 MG PO TABS
1.0000 | ORAL_TABLET | Freq: Two times a day (BID) | ORAL | Status: DC
  Administered 2019-11-22 – 2019-11-23 (×2): 1 via ORAL
  Filled 2019-11-22 (×2): qty 1

## 2019-11-22 MED ORDER — VANCOMYCIN HCL IN DEXTROSE 1-5 GM/200ML-% IV SOLN
1000.0000 mg | INTRAVENOUS | Status: AC
  Administered 2019-11-22: 1000 mg via INTRAVENOUS
  Filled 2019-11-22: qty 200

## 2019-11-22 MED ORDER — HYDROMORPHONE HCL 1 MG/ML IJ SOLN: 0.5000 mg | INTRAMUSCULAR | Status: DC | PRN

## 2019-11-22 MED ORDER — PHENYLEPHRINE HCL-NACL 10-0.9 MG/250ML-% IV SOLN
INTRAVENOUS | Status: DC | PRN
  Administered 2019-11-22: 20 ug/min via INTRAVENOUS

## 2019-11-22 MED ORDER — PANTOPRAZOLE SODIUM 40 MG PO TBEC
40.0000 mg | DELAYED_RELEASE_TABLET | Freq: Every day | ORAL | Status: DC
  Administered 2019-11-23: 08:00:00 40 mg via ORAL
  Filled 2019-11-22: qty 1

## 2019-11-22 MED ORDER — PHENOL 1.4 % MT LIQD: 1.0000 | OROMUCOSAL | Status: DC | PRN

## 2019-11-22 MED ORDER — MIDAZOLAM HCL 2 MG/2ML IJ SOLN
1.0000 mg | INTRAMUSCULAR | Status: DC
  Administered 2019-11-22: 1 mg via INTRAVENOUS

## 2019-11-22 MED ORDER — SUCCINYLCHOLINE 20MG/ML (10ML) SYRINGE FOR MEDFUSION PUMP - OPTIME
INTRAMUSCULAR | Status: DC | PRN
  Administered 2019-11-22: 120 mg via INTRAVENOUS

## 2019-11-22 MED ORDER — ONDANSETRON HCL 4 MG PO TABS: 4.0000 mg | ORAL_TABLET | Freq: Four times a day (QID) | ORAL | Status: DC | PRN

## 2019-11-22 MED ORDER — PROPOFOL 10 MG/ML IV BOLUS
INTRAVENOUS | Status: AC
  Filled 2019-11-22: qty 20

## 2019-11-22 MED ORDER — FENTANYL CITRATE (PF) 100 MCG/2ML IJ SOLN
INTRAMUSCULAR | Status: DC | PRN
  Administered 2019-11-22 (×2): 50 ug via INTRAVENOUS

## 2019-11-22 MED ORDER — VANCOMYCIN HCL 1000 MG IV SOLR
INTRAVENOUS | Status: AC
  Filled 2019-11-22: qty 1000

## 2019-11-22 MED ORDER — ALLOPURINOL 300 MG PO TABS
300.0000 mg | ORAL_TABLET | Freq: Every day | ORAL | Status: DC
  Administered 2019-11-23: 300 mg via ORAL
  Filled 2019-11-22: qty 1

## 2019-11-22 MED ORDER — BUPIVACAINE-EPINEPHRINE (PF) 0.5% -1:200000 IJ SOLN
INTRAMUSCULAR | Status: DC | PRN
  Administered 2019-11-22 (×5): 3 mL via PERINEURAL

## 2019-11-22 MED ORDER — PHENYLEPHRINE HCL (PRESSORS) 10 MG/ML IV SOLN
INTRAVENOUS | Status: AC
  Filled 2019-11-22: qty 1

## 2019-11-22 MED ORDER — ALBUTEROL SULFATE HFA 108 (90 BASE) MCG/ACT IN AERS: 2.0000 | INHALATION_SPRAY | Freq: Every day | RESPIRATORY_TRACT | Status: DC | PRN

## 2019-11-22 MED ORDER — FENTANYL CITRATE (PF) 100 MCG/2ML IJ SOLN
50.0000 ug | INTRAMUSCULAR | Status: DC
  Administered 2019-11-22: 10:00:00 50 ug via INTRAVENOUS

## 2019-11-22 MED ORDER — TRANEXAMIC ACID-NACL 1000-0.7 MG/100ML-% IV SOLN
1000.0000 mg | INTRAVENOUS | Status: AC
  Administered 2019-11-22: 1000 mg via INTRAVENOUS
  Filled 2019-11-22: qty 100

## 2019-11-22 MED ORDER — MAGNESIUM CITRATE PO SOLN: 1.0000 | Freq: Once | ORAL | Status: DC | PRN

## 2019-11-22 MED ORDER — MONTELUKAST SODIUM 10 MG PO TABS
10.0000 mg | ORAL_TABLET | Freq: Every day | ORAL | Status: DC
  Administered 2019-11-23: 10 mg via ORAL
  Filled 2019-11-22: qty 1

## 2019-11-22 MED ORDER — LACTATED RINGERS IV SOLN: INTRAVENOUS | Status: DC | PRN

## 2019-11-22 MED ORDER — 0.9 % SODIUM CHLORIDE (POUR BTL) OPTIME
TOPICAL | Status: DC | PRN
  Administered 2019-11-22: 1000 mL

## 2019-11-22 MED ORDER — METOCLOPRAMIDE HCL 5 MG/ML IJ SOLN: 5.0000 mg | Freq: Three times a day (TID) | INTRAMUSCULAR | Status: DC | PRN

## 2019-11-22 MED ORDER — FENTANYL CITRATE (PF) 100 MCG/2ML IJ SOLN: 25.0000 ug | INTRAMUSCULAR | Status: DC | PRN

## 2019-11-22 MED ORDER — ONDANSETRON HCL 4 MG/2ML IJ SOLN
INTRAMUSCULAR | Status: DC | PRN
  Administered 2019-11-22 (×2): 4 mg via INTRAVENOUS

## 2019-11-22 MED ORDER — MIDAZOLAM HCL 2 MG/2ML IJ SOLN
INTRAMUSCULAR | Status: AC
  Administered 2019-11-22: 1 mg via INTRAVENOUS
  Filled 2019-11-22: qty 2

## 2019-11-22 MED ORDER — FENTANYL CITRATE (PF) 100 MCG/2ML IJ SOLN
INTRAMUSCULAR | Status: AC
  Administered 2019-11-22: 50 ug via INTRAVENOUS
  Filled 2019-11-22: qty 2

## 2019-11-22 MED ORDER — BISACODYL 10 MG RE SUPP: 10.0000 mg | Freq: Every day | RECTAL | Status: DC | PRN

## 2019-11-22 MED ORDER — ONDANSETRON HCL 4 MG/2ML IJ SOLN
INTRAMUSCULAR | Status: AC
  Filled 2019-11-22: qty 4

## 2019-11-22 MED ORDER — ALBUTEROL SULFATE (2.5 MG/3ML) 0.083% IN NEBU: 2.5000 mg | INHALATION_SOLUTION | Freq: Every day | RESPIRATORY_TRACT | Status: DC | PRN

## 2019-11-22 MED ORDER — BUDESONIDE 0.5 MG/2ML IN SUSP
0.5000 mg | Freq: Two times a day (BID) | RESPIRATORY_TRACT | Status: DC
  Administered 2019-11-22: 0.5 mg via RESPIRATORY_TRACT
  Filled 2019-11-22 (×2): qty 2

## 2019-11-22 MED ORDER — METOCLOPRAMIDE HCL 5 MG PO TABS: 5.0000 mg | ORAL_TABLET | Freq: Three times a day (TID) | ORAL | Status: DC | PRN

## 2019-11-22 MED ORDER — ROCURONIUM BROMIDE 100 MG/10ML IV SOLN
INTRAVENOUS | Status: DC | PRN
  Administered 2019-11-22: 20 mg via INTRAVENOUS

## 2019-11-22 MED ORDER — DICYCLOMINE HCL 10 MG PO CAPS
10.0000 mg | ORAL_CAPSULE | Freq: Four times a day (QID) | ORAL | Status: DC | PRN
  Filled 2019-11-22: qty 1

## 2019-11-22 MED ORDER — SUGAMMADEX SODIUM 200 MG/2ML IV SOLN
INTRAVENOUS | Status: DC | PRN
  Administered 2019-11-22: 200 mg via INTRAVENOUS

## 2019-11-22 MED ORDER — ONDANSETRON HCL 4 MG/2ML IJ SOLN
4.0000 mg | Freq: Four times a day (QID) | INTRAMUSCULAR | Status: DC | PRN
  Administered 2019-11-22: 4 mg via INTRAVENOUS
  Filled 2019-11-22: qty 2

## 2019-11-22 MED ORDER — DEXAMETHASONE SODIUM PHOSPHATE 10 MG/ML IJ SOLN
INTRAMUSCULAR | Status: AC
  Filled 2019-11-22: qty 1

## 2019-11-22 MED ORDER — ONDANSETRON HCL 4 MG/2ML IJ SOLN
INTRAMUSCULAR | Status: AC
  Filled 2019-11-22: qty 2

## 2019-11-22 MED ORDER — STERILE WATER FOR IRRIGATION IR SOLN
Status: DC | PRN
  Administered 2019-11-22: 2000 mL

## 2019-11-22 MED ORDER — DIPHENHYDRAMINE HCL 12.5 MG/5ML PO ELIX: 12.5000 mg | ORAL_SOLUTION | ORAL | Status: DC | PRN

## 2019-11-22 MED ORDER — VANCOMYCIN HCL 1 G IV SOLR
INTRAVENOUS | Status: DC | PRN
  Administered 2019-11-22: 1000 mg via TOPICAL

## 2019-11-22 MED ORDER — ACETAMINOPHEN 500 MG PO TABS
1000.0000 mg | ORAL_TABLET | Freq: Three times a day (TID) | ORAL | Status: DC
  Administered 2019-11-22 – 2019-11-23 (×3): 1000 mg via ORAL
  Filled 2019-11-22 (×3): qty 2

## 2019-11-22 MED ORDER — CLINDAMYCIN PHOSPHATE 600 MG/50ML IV SOLN
600.0000 mg | Freq: Four times a day (QID) | INTRAVENOUS | Status: AC
  Administered 2019-11-22 – 2019-11-23 (×3): 600 mg via INTRAVENOUS
  Filled 2019-11-22 (×3): qty 50

## 2019-11-22 MED ORDER — TRAZODONE HCL 50 MG PO TABS: 50.0000 mg | ORAL_TABLET | Freq: Every evening | ORAL | Status: DC | PRN

## 2019-11-22 MED ORDER — BUPIVACAINE LIPOSOME 1.3 % IJ SUSP
INTRAMUSCULAR | Status: DC | PRN
  Administered 2019-11-22 (×5): 2 mL via PERINEURAL

## 2019-11-22 MED ORDER — ONDANSETRON HCL 4 MG/2ML IJ SOLN: 4.0000 mg | Freq: Once | INTRAMUSCULAR | Status: DC | PRN

## 2019-11-22 MED ORDER — DEXAMETHASONE SODIUM PHOSPHATE 10 MG/ML IJ SOLN
INTRAMUSCULAR | Status: DC | PRN
  Administered 2019-11-22: 10 mg via INTRAVENOUS

## 2019-11-22 MED ORDER — SODIUM CHLORIDE 0.9 % IR SOLN
Status: DC | PRN
  Administered 2019-11-22: 3000 mL

## 2019-11-22 MED ORDER — RIVAROXABAN 10 MG PO TABS
10.0000 mg | ORAL_TABLET | Freq: Every day | ORAL | Status: DC
  Administered 2019-11-23: 08:00:00 10 mg via ORAL
  Filled 2019-11-22: qty 1

## 2019-11-22 SURGICAL SUPPLY — 68 items
AID PSTN UNV HD RSTRNT DISP (MISCELLANEOUS) ×1
APL PRP STRL LF DISP 70% ISPRP (MISCELLANEOUS) ×2
BASEPLATE GLENOSPHERE 25 STD (Miscellaneous) ×1 IMPLANT
BASEPLATE GLENOSPHERE 25MM STD (Miscellaneous) ×1 IMPLANT
BIT DRILL 3.2 PERIPHERAL SCREW (BIT) ×2 IMPLANT
BLADE SAW SAG 73X25 THK (BLADE) ×2
BLADE SAW SGTL 73X25 THK (BLADE) ×1 IMPLANT
BSPLAT GLND STD 25 RVRS SHLDR (Miscellaneous) ×1 IMPLANT
CHLORAPREP W/TINT 26 (MISCELLANEOUS) ×6 IMPLANT
CLOSURE STERI-STRIP 1/2X4 (GAUZE/BANDAGES/DRESSINGS) ×1
CLSR STERI-STRIP ANTIMIC 1/2X4 (GAUZE/BANDAGES/DRESSINGS) ×2 IMPLANT
COOLER ICEMAN CLASSIC (MISCELLANEOUS) ×2 IMPLANT
COVER BACK TABLE 60X90IN (DRAPES) IMPLANT
COVER SURGICAL LIGHT HANDLE (MISCELLANEOUS) ×3 IMPLANT
COVER WAND RF STERILE (DRAPES) ×3 IMPLANT
DRAPE INCISE IOBAN 66X45 STRL (DRAPES) ×3 IMPLANT
DRAPE ORTHO SPLIT 77X108 STRL (DRAPES) ×6
DRAPE SHEET LG 3/4 BI-LAMINATE (DRAPES) ×6 IMPLANT
DRAPE SURG ORHT 6 SPLT 77X108 (DRAPES) ×2 IMPLANT
DRSG AQUACEL AG ADV 3.5X 6 (GAUZE/BANDAGES/DRESSINGS) ×3 IMPLANT
ELECT BLADE TIP CTD 4 INCH (ELECTRODE) ×3 IMPLANT
ELECT REM PT RETURN 15FT ADLT (MISCELLANEOUS) ×3 IMPLANT
GLENOSPHERE REV SHOULDER 36 (Joint) ×2 IMPLANT
GLOVE BIO SURGEON STRL SZ 6.5 (GLOVE) ×4 IMPLANT
GLOVE BIO SURGEONS STRL SZ 6.5 (GLOVE) ×2
GLOVE BIOGEL PI IND STRL 6.5 (GLOVE) ×1 IMPLANT
GLOVE BIOGEL PI IND STRL 8 (GLOVE) ×1 IMPLANT
GLOVE BIOGEL PI INDICATOR 6.5 (GLOVE) ×2
GLOVE BIOGEL PI INDICATOR 8 (GLOVE) ×2
GLOVE ECLIPSE 8.0 STRL XLNG CF (GLOVE) ×6 IMPLANT
GOWN STRL REUS W/TWL LRG LVL3 (GOWN DISPOSABLE) ×3 IMPLANT
GOWN STRL REUS W/TWL XL LVL3 (GOWN DISPOSABLE) ×3 IMPLANT
GUIDEWIRE GLENOID 2.5X220 (WIRE) ×2 IMPLANT
HANDPIECE INTERPULSE COAX TIP (DISPOSABLE) ×3
HEMOSTAT SURGICEL 2X14 (HEMOSTASIS) IMPLANT
IMPL REVERSE SHOULDER 0X3.5 (Shoulder) IMPLANT
IMPLANT REVERSE SHOULDER 0X3.5 (Shoulder) ×3 IMPLANT
INSERT REV KIT SHOULDER 9X36 (Joint) ×2 IMPLANT
KIT BASIN (CUSTOM PROCEDURE TRAY) ×3 IMPLANT
KIT STABILIZATION SHOULDER (MISCELLANEOUS) ×3 IMPLANT
KIT TURNOVER KIT A (KITS) IMPLANT
MANIFOLD NEPTUNE II (INSTRUMENTS) ×3 IMPLANT
NDL MAYO CATGUT SZ4 TPR NDL (NEEDLE) IMPLANT
NEEDLE MAYO CATGUT SZ4 (NEEDLE) IMPLANT
NS IRRIG 1000ML POUR BTL (IV SOLUTION) ×3 IMPLANT
PACK SHOULDER (CUSTOM PROCEDURE TRAY) ×3 IMPLANT
PAD COLD SHLDR WRAP-ON (PAD) ×2 IMPLANT
PENCIL SMOKE EVACUATOR (MISCELLANEOUS) IMPLANT
RESTRAINT HEAD UNIVERSAL NS (MISCELLANEOUS) ×3 IMPLANT
SCREW BONE 6.5X40 SM (Screw) ×2 IMPLANT
SCREW PERIPHERAL 30 (Screw) ×2 IMPLANT
SCREW PERIPHERAL 5.0X34 (Screw) ×2 IMPLANT
SET HNDPC FAN SPRY TIP SCT (DISPOSABLE) ×1 IMPLANT
SLING ARM IMMOBILIZER MED (SOFTGOODS) ×2 IMPLANT
SLING ULTRA III MED (ORTHOPEDIC SUPPLIES) ×3 IMPLANT
STEM HUMERAL SZ4B STD 78 PTC (Stem) ×2 IMPLANT
SUCTION FRAZIER HANDLE 12FR (TUBING) ×3
SUCTION TUBE FRAZIER 12FR DISP (TUBING) ×1 IMPLANT
SUT ETHIBOND 2 V 37 (SUTURE) ×3 IMPLANT
SUT ETHIBOND NAB CT1 #1 30IN (SUTURE) ×3 IMPLANT
SUT FIBERWIRE #5 38 CONV NDL (SUTURE) ×12
SUT MNCRL AB 4-0 PS2 18 (SUTURE) ×3 IMPLANT
SUT VIC AB 0 CT1 36 (SUTURE) ×3 IMPLANT
SUT VIC AB 3-0 SH 27 (SUTURE) ×3
SUT VIC AB 3-0 SH 27X BRD (SUTURE) ×1 IMPLANT
SUTURE FIBERWR #5 38 CONV NDL (SUTURE) ×4 IMPLANT
TOWEL OR 17X26 10 PK STRL BLUE (TOWEL DISPOSABLE) ×3 IMPLANT
WATER STERILE IRR 1000ML POUR (IV SOLUTION) ×6 IMPLANT

## 2019-11-22 NOTE — Anesthesia Preprocedure Evaluation (Signed)
Anesthesia Evaluation  Patient identified by MRN, date of birth, ID band Patient awake    Reviewed: Allergy & Precautions, H&P , NPO status , Patient's Chart, lab work & pertinent test results  Airway Mallampati: II  TM Distance: >3 FB Neck ROM: Full    Dental  (+) Dental Advisory Given, Caps, Teeth Intact   Pulmonary asthma , sleep apnea and Continuous Positive Airway Pressure Ventilation , former smoker,    Pulmonary exam normal breath sounds clear to auscultation       Cardiovascular hypertension, Pt. on medications + Valvular Problems/Murmurs MR  Rhythm:Regular Rate:Normal + Systolic murmurs    Neuro/Psych Seizures -,  Anxiety    GI/Hepatic Neg liver ROS, GERD  Medicated and Controlled,  Endo/Other  negative endocrine ROS  Renal/GU negative Renal ROS     Musculoskeletal  (+) Arthritis , Osteoarthritis,  Fibromyalgia -  Abdominal (+) + obese,   Peds  Hematology   Anesthesia Other Findings Full upper Bridge  Reproductive/Obstetrics                             Anesthesia Physical  Anesthesia Plan  ASA: III  Anesthesia Plan: General   Post-op Pain Management:  Regional for Post-op pain   Induction: Intravenous  PONV Risk Score and Plan: 2 and Ondansetron and Dexamethasone  Airway Management Planned: Oral ETT  Additional Equipment: None  Intra-op Plan:   Post-operative Plan: Extubation in OR  Informed Consent: I have reviewed the patients History and Physical, chart, labs and discussed the procedure including the risks, benefits and alternatives for the proposed anesthesia with the patient or authorized representative who has indicated his/her understanding and acceptance.     Dental advisory given  Plan Discussed with: CRNA  Anesthesia Plan Comments:         Anesthesia Quick Evaluation

## 2019-11-22 NOTE — Anesthesia Postprocedure Evaluation (Signed)
Anesthesia Post Note  Patient: Lydia Campbell  Procedure(s) Performed: REVERSE SHOULDER ARTHROPLASTY (Right Shoulder)     Patient location during evaluation: PACU Anesthesia Type: General Level of consciousness: awake Pain management: pain level controlled Vital Signs Assessment: post-procedure vital signs reviewed and stable Respiratory status: spontaneous breathing Cardiovascular status: stable Postop Assessment: no apparent nausea or vomiting Anesthetic complications: no    Last Vitals:  Vitals:   11/22/19 1400 11/22/19 1415  BP: 133/64 (!) 124/59  Pulse: 67 85  Resp: 11 12  Temp: (!) 36.3 C (!) 36.3 C  SpO2: 94% 94%    Last Pain:  Vitals:   11/22/19 1415  TempSrc:   PainSc: 0-No pain   Pain Goal:                   Huston Foley

## 2019-11-22 NOTE — Op Note (Signed)
Orthopaedic Surgery Operative Note (CSN: [id number])  Lydia Campbell  May 08, 1941 Date of Surgery: 11/22/2019   Diagnoses:  Right shoulder irreparable rotator cuff tear  Procedure: Right reverse total Shoulder Arthroplasty   Operative Finding Successful completion of planned procedure.  Patient's axillary nerve prior to surgery was at least partially firing and we felt that proceeding was appropriate.  Her superior cuff was completely torn with the exception of the teres minor.  The subscapularis was mostly torn away and was scarred down in a way that was not amenable to performing a tug test.  We stayed away from the axillary nerve in that setting.  Post-operative plan: The patient will be NWB in sling.  The patient will be will be admitted to observation due to medical complexity, monitoring and pain management.  DVT prophylaxis Xarelto 10 mg/day.  Pain control with PRN pain medication preferring oral medicines.  Follow up plan will be scheduled in approximately 7 days for incision check and XR.  Physical therapy to start after first visit.  Implants: Tornier 25 baseplate with a 36 sphere, 6.5 x 40 center screw 4B short stem, 0 high offset tray and 36+6 poly-  Post-Op Diagnosis: Same Surgeons:Primary: Hiram Gash, MD Assistants:Caroline McBane PA-C Location: [facility] ROOM 06 Anesthesia: General with Exparel Interscalene Antibiotics: Vancomycin 1g preop, Vancomycin 1070m locally Tourniquet time: None Estimated Blood Loss: 1283Complications: None Specimens: None Implants: Implant Name Type Inv. Item Serial No. Manufacturer Lot No. LRB No. Used Action  BASEPLATE GLENOSPHERE 266QHSTD - [id number] BASEPLATE GLENOSPHERE 254SFSTD  [employer] [id number] 1 Implanted  SCREW BONE 6.5X40 SM - [id number] SCREW BONE 6.5X40 SM  [employer]  Right 1 Implanted  SCREW PERIPHERAL 5.0X34 - [id number] SCREW PERIPHERAL 5.0X34  [employer]  Right 1 Implanted  SCREW PERIPHERAL  30 - [id number] SCREW PERIPHERAL 30  [employer]  Right 1 Implanted  GLENOSPHERE REV SHOULDER 36 - [id number] GLENOSPHERE REV SHOULDER 36  [employer] [id number] 1 Implanted  INSERT REV KIT SHOULDER 9X36 - [id number] INSERT REV KIT SHOULDER 9X36  [employer] [id number] 1 Implanted  IMPLANT REVERSE SHOULDER 0X3.5 - [id number] IMPLANT REVERSE SHOULDER [id number] 1 Implanted  STEM HUMERAL SZ4B STD 793PTC - [id number] STEM HUMERAL SZ4B STD 789PTC  [employer] [id number] 1 Implanted    Indications for Surgery:   Lydia FRIESEis a 79y.o. female with dislocation and traumatic acute on chronic rotator cuff tear involving at least 3 tendons with retraction past the glenoid.  She had some transient axillary nerve findings early on however this is improved in the time and preop she had firing of her anterior deltoid and sensation was reported intact.  We did discuss that she had an increased risk of dislocation compared to other patients however we thought that this was appropriate to proceed with surgery and stay away from the axillary nerve.  Benefits and risks of operative and nonoperative management were discussed prior to surgery with patient/guardian(s) and informed consent form was completed.  Infection and need for further surgery were discussed as was prosthetic stability and cuff issues.  We additionally specifically discussed risks of axillary nerve injury, infection, periprosthetic fracture, continued pain and longevity of implants prior to beginning procedure.      Procedure:   The patient was identified in the preoperative holding area where the surgical site was marked. Block placed by anesthesia with  exparel.  The patient was taken to the OR where a procedural timeout was called and the above noted anesthesia was induced.  The patient was positioned beachchair on allen table with spider arm positioner.  Preoperative  antibiotics were dosed.  The patient's right shoulder was prepped and draped in the usual sterile fashion.  A second preoperative timeout was called.       Standard deltopectoral approach was performed with a #10 blade. We dissected down to the subcutaneous tissues and the cephalic vein was taken laterally with the deltoid. Clavipectoral fascia was incised in line with the incision. Deep retractors were placed. The long of the biceps tendon was identified and there was significant tenosynovitis present.  Tenodesis was performed to the pectoralis tendon with #2 Ethibond. The remaining biceps was followed up into the rotator interval where it was released.   The subscapularis was taken down in a full thickness layer with capsule along the humeral neck extending inferiorly around the humeral head. We continued releasing the capsule directly off of the osteophytes inferiorly all the way around the corner. This allowed [place] to dislocate the humeral head.   Humeral head had some mild wear superiorly but overall is intact in the setting of irreparable cuff tear.  The rotator cuff was carefully examined and noted to be irreperably torn.  The decision was confirmed that a reverse total shoulder was indicated for this patient.  There were osteophytes along the inferior humeral neck. The osteophytes were removed with an osteotome and a rongeur.  Osteophytes were removed with a rongeur and an osteotome and the anatomic neck was well visualized.     A humeral cutting guide was inserted down the intramedullary canal. The version was set at 20 of retroversion. Humeral osteotomy was performed with an oscillating saw. The head fragment was passed off the back table. A starter awl was used to open the humeral canal. We next used T-handle straight sound reamers to ream up to an appropriate fit. A chisel was used to remove proximal humeral bone. We then broached starting with a size one broach and broaching up to 4 which  obtained an appropriate fit. The broach handle was removed. A cut protector was placed. The broach handle was removed and a cut protector was placed. The humerus was retracted posteriorly and we turned our attention to glenoid exposure.  The subscapularis was again identified and immediately we took care to palpate the axillary nerve anteriorly and verify its position with gentle palpation as well as the tug test.  The axillary nerve was not able to be palpated due to significant subcoracoid scarring and we did not feel that dissection of this would be in the patient's best interest.  We instead attempted to stay away from it.  An anterior deltoid retractor was then placed as well as a small Hohmann retractor superiorly.   Glenoid was relatively intact from a cartilage standpoint secondary to this being an irreparable cuff tear.  The glenoid drill guide was placed and used to drill a guide pin in the center, inferior position. The glenoid face was then reamed concentrically over the guide wire. The center hole was drilled over the guidepin in a near anatomic angle of version. Next the glenoid vault was drilled back to a depth of 40 mm.  We tapped and then placed a 21m size baseplate with 253mlateralization was selected with a 6.5 mm x 40 mm length central screw.  The base plate was screwed into the  glenoid vault obtaining secure fixation. We next placed superior and inferior locking screws for additional fixation.  Next a 36 mm glenosphere was selected and impacted onto the baseplate. The center screw was tightened.  We turned attention back to the humeral side. The cut protector was removed. We trialed with multiple size tray and polyethylene options and selected a 6 which provided good stability and range of motion without excess soft tissue tension. The offset was dialed in to match the normal anatomy. The shoulder was trialed.  There was good ROM in all planes and the shoulder was stable with no  inferior translation.  The real humeral implants were opened after again confirming sizes.  The trial was removed. #5 Fiberwire x4 sutures passed through the humeral neck for subscap repair. The humeral component was press-fit obtaining a secure fit. A +0 high offset tray was selected and impacted onto the stem.  A 36+6 polyethylene liner was impacted onto the stem.  The joint was reduced and thoroughly irrigated with pulsatile lavage. Subscap was repaired back with #5 Fiberwire sutures through bone tunnels. Hemostasis was obtained. The deltopectoral interval was reapproximated with #1 Ethibond. The subcutaneous tissues were closed with 2-0 Vicryl and the skin was closed with running monocryl.    The wounds were cleaned and dried and an Aquacel dressing was placed. The drapes taken down. The arm was placed into sling with abduction pillow. Patient was awakened, extubated, and transferred to the recovery room in stable condition. There were no intraoperative complications. The sponge, needle, and attention counts were correct at the end of the case.        Noemi Chapel, PA-C, present and scrubbed throughout the case, critical for completion in a timely fashion, and for retraction, instrumentation, closure.

## 2019-11-22 NOTE — Anesthesia Procedure Notes (Signed)
Anesthesia Regional Block: Interscalene brachial plexus block   Pre-Anesthetic Checklist: ,, timeout performed, Correct Patient, Correct Site, Correct Laterality, Correct Procedure, Correct Position, site marked, Risks and benefits discussed,  Surgical consent,  Pre-op evaluation,  At surgeon's request and post-op pain management  Laterality: Right and Upper  Prep: chloraprep       Needles:  Injection technique: Single-shot  Needle Type: Echogenic Stimulator Needle     Needle Length: 10cm  Needle Gauge: 21   Needle insertion depth: 1 cm   Additional Needles:   Procedures:,,,, ultrasound used (permanent image in chart),,,,  Narrative:  Start time: 11/22/2019 10:10 AM End time: 11/22/2019 10:20 AM Injection made incrementally with aspirations every 5 mL.  Performed by: Personally  Anesthesiologist: Lyn Hollingshead, MD

## 2019-11-22 NOTE — Interval H&P Note (Signed)
Axillary nerve function is present in this time though it had previously been out. Patient like to proceed.

## 2019-11-22 NOTE — Transfer of Care (Signed)
Immediate Anesthesia Transfer of Care Note  Patient: Lydia Campbell  Procedure(s) Performed: REVERSE SHOULDER ARTHROPLASTY (Right Shoulder)  Patient Location: PACU  Anesthesia Type:GA combined with regional for post-op pain  Level of Consciousness: awake, alert , oriented and patient cooperative  Airway & Oxygen Therapy: Patient Spontanous Breathing and Patient connected to nasal cannula oxygen  Post-op Assessment: Report given to RN, Post -op Vital signs reviewed and stable and Patient moving all extremities  Post vital signs: Reviewed and stable  Last Vitals:  Vitals Value Taken Time  BP 151/70 11/22/19 1247  Temp    Pulse 92 11/22/19 1248  Resp 12 11/22/19 1248  SpO2 100 % 11/22/19 1248  Vitals shown include unvalidated device data.  Last Pain:  Vitals:   11/22/19 1030  TempSrc:   PainSc: 0-No pain         Complications: No apparent anesthesia complications

## 2019-11-22 NOTE — Anesthesia Procedure Notes (Signed)
Procedure Name: Intubation Date/Time: 11/22/2019 11:13 AM Performed by: Suan Halter, CRNA Pre-anesthesia Checklist: Patient identified, Emergency Drugs available, Suction available and Patient being monitored Patient Re-evaluated:Patient Re-evaluated prior to induction Oxygen Delivery Method: Circle system utilized Preoxygenation: Pre-oxygenation with 100% oxygen Induction Type: IV induction Ventilation: Mask ventilation without difficulty Laryngoscope Size: Mac and 3 Grade View: Grade I Tube type: Oral Tube size: 7.0 mm Number of attempts: 1 Airway Equipment and Method: Stylet and Oral airway Placement Confirmation: ETT inserted through vocal cords under direct vision,  positive ETCO2 and breath sounds checked- equal and bilateral Secured at: 23 cm Tube secured with: Tape Dental Injury: Teeth and Oropharynx as per pre-operative assessment

## 2019-11-22 NOTE — Discharge Instructions (Signed)

## 2019-11-22 NOTE — Plan of Care (Signed)
Plan of care 

## 2019-11-22 NOTE — Progress Notes (Signed)
Assisted Dr. Hatchett with right, ultrasound guided, interscalene  block. Side rails up, monitors on throughout procedure. See vital signs in flow sheet. Tolerated Procedure well.  

## 2019-11-23 ENCOUNTER — Encounter: Payer: Self-pay | Admitting: *Deleted

## 2019-11-23 DIAGNOSIS — M75121 Complete rotator cuff tear or rupture of right shoulder, not specified as traumatic: Secondary | ICD-10-CM | POA: Diagnosis not present

## 2019-11-23 MED ORDER — ACETAMINOPHEN 500 MG PO TABS
1000.0000 mg | ORAL_TABLET | Freq: Three times a day (TID) | ORAL | 0 refills | Status: AC
Start: 2019-11-23 — End: 2019-12-07

## 2019-11-23 MED ORDER — ONDANSETRON HCL 4 MG PO TABS: 4.0000 mg | ORAL_TABLET | Freq: Three times a day (TID) | ORAL | 1 refills | Status: AC | PRN

## 2019-11-23 MED ORDER — RIVAROXABAN 10 MG PO TABS
10.0000 mg | ORAL_TABLET | Freq: Every day | ORAL | 0 refills | Status: DC
Start: 2019-11-23 — End: 2023-03-17

## 2019-11-23 MED ORDER — OXYCODONE HCL 5 MG PO TABS: ORAL_TABLET | ORAL | 0 refills | Status: AC

## 2019-11-23 NOTE — Discharge Summary (Signed)
Patient ID: Lydia Campbell MRN: [id number] DOB/AGE: [birth year] 79 y.o.  Admit date: 11/22/2019 Discharge date: 11/23/2019  Admission Diagnoses: Right shoulder irreparable rotator cuff tear  Discharge Diagnoses:  Right shoulder irreparable rotator cuff tear  Active Problems:   Arthritis of right glenohumeral joint   Past Medical History:  Diagnosis Date  . Anxiety   . Asthma   . Blood transfusion 07/2008   "related to chemo" (07/25/2012)  . Breast cancer ([place])    breast lft  . Chronic edema   . Chronic lower back pain   . Clotting disorder ([place])    "sudden chest pain; found multiple blood clots in both lungs; they think it was from Tamoxifen" (07/25/2012)  . Complication of anesthesia    Hard to wake up  . DDD (degenerative disc disease), lumbosacral   . Fibromyalgia   . GERD (gastroesophageal reflux disease)   . Gout   . H/O hiatal hernia   . Heart murmur   . Hx of blood clots 02/2011   "sudden chest pain; found multiple blood clots in both lungs; they think it was from Tamoxifen; haven't had any since 11/2011" (07/25/2012)  . Hypertension    pt denies  . Left knee DJD   . Lymph edema    Left arm after mastectomy  . Mild CAD    pt denies  . OSA on CPAP    Moderate  . Osteoarthritis    "pretty much everywhere" (07/25/2012)  . Pulmonary embolism ([place]) 02/2011   after chemo  . Right knee DJD   . Seasonal allergies   . Seizures ([place])    "seizures vs convulsions related to PCN when I was a child" (07/25/2012)  . Subcutaneous hematoma 07/25/2012   Intraoperative cultures are pending on this fluid collection that was just outside the left knee joint.  Gram stain had few WBC monos and polys.  . Varicose veins of both legs with edema     Procedures Performed: Right reverse total Shoulder Arthroplasty  Discharged Condition: good/stable  Hospital Course: Patient brought in to [facility] for scheduled procedure.  She tolerated procedure well.  She was kept for  monitoring overnight for pain control and medical monitoring postop and was found to be stable for DC home the morning after surgery.  Patient was instructed on specific activity restrictions and all questions were answered.  Consults: OT  Significant Diagnostic Studies: No additional pertinent studies  Treatments: Surgery  Discharge Exam: Right upper extremity: Dressing CDI and sling well fitting.  Axillary nerve sensation/motor altered in setting of block and unable to be fully tested. Distal motor and sensory altered in setting of block. She endorses distal sensation. Good grip strength. Warm well perfused hand   Disposition: Discharge disposition: 01-Home or Self Care      Follow-up:  1 week in office with Dr. Griffin Basil Post-op medications: Reviewed with the patient. Due to her history of breast cancer and previous PE, we will start patient on Xarelto for DVT prophylaxis.   Discharge Instructions    Call MD for:  redness, tenderness, or signs of infection (pain, swelling, redness, odor or green/yellow discharge around incision site)   Complete by: As directed    Call MD for:  severe uncontrolled pain   Complete by: As directed    Call MD for:  temperature >100.4   Complete by: As directed    Diet - low sodium heart healthy   Complete by: As directed    Discharge instructions  Complete by: As directed    Ophelia Charter MD, MPH Noemi Chapel, PA-C [facility] [address], [address] [phone] (tel)   [phone] (fax)   POST-OPERATIVE INSTRUCTIONS - TOTAL SHOULDER REPLACEMENT    WOUND CARE - You may leave the operative dressing in place until your follow-up appointment. - [place]. - There may be a small amount of fluid/bleeding leaking at the surgical site. This is normal after surgery.  - If it fills with liquid or blood please call us immediately to change it for you. - Use the provided ice machine or Ice packs as  often as possible for the first 3-4 days, then as needed for pain relief.  Keep a layer of cloth or a shirt between your skin and the cooling unit to prevent frost bite as it can get very cold.  SHOWERING: - You may shower on Post-Op Day #2.  - The dressing is water resistant but do not scrub it as it may start to peel up.   - You may remove the sling for showering, but keep a water resistant pillow under the arm to keep both the  elbow and shoulder away from the body (mimicking the abduction sling).  - Gently pat the area dry.  - Do not soak the shoulder in water. Do not go swimming in the pool or ocean until your sutures are removed. - KEEP THE INCISIONS CLEAN AND DRY.  EXERCISES - Wear the sling at all times except when doing your exercises.  - You may remove the sling for showering, but keep the arm across the chest or in a secondary sling.    - Accidental/Purposeful External Rotation and shoulder flexion (reaching behind you) is to be avoided at all costs for the first month. - It is ok to come out of your sling if your are sitting and have assistance for eating.  Do not lift anything heavier than 1 pound until we discuss it further in clinic.  Please perform the exercises:   Elbow / Hand / Wrist  Range of Motion Exercises Grip strengthening   REGIONAL ANESTHESIA (NERVE BLOCKS) The anesthesia team may have performed a nerve block for you if safe in the setting of your care.  This is a great tool used to minimize pain.  Typically the block may start wearing off overnight but the long acting medicine may last for 3-4 days.  The nerve block wearing off can be a challenging period but please utilize your as needed pain medications to try and manage this period.    POST-OP MEDICATIONS- Multimodal approach to pain control In general your pain will be controlled with a combination of substances.  Prescriptions unless otherwise discussed are electronically sent to your pharmacy.  This is a  carefully made plan we use to minimize narcotic use.     Acetaminophen - Non-narcotic pain medicine taken on a scheduled basis  Oxycodone - This is a strong narcotic, to be used only on an "as needed" basis for pain. Xarelto - This medicine is used to minimize the risk of blood clots after surgery. Zofran -  take as needed for nausea   FOLLOW-UP If you develop a Fever (>101.5), Redness or Drainage from the surgical incision site, please call our office to arrange for an evaluation. Please call the office to schedule a follow-up appointment for a wound check, 7-10 days post-operatively.  IF YOU HAVE ANY QUESTIONS, PLEASE FEEL FREE TO CALL OUR  OFFICE.  HELPFUL INFORMATION  If you had a block, it will wear off between 8-24 hrs postop typically.  This is period when your pain may go from nearly zero to the pain you would have had post-op without the block.  This is an abrupt transition but nothing dangerous is happening.  You may take an extra dose of narcotic when this happens.  Your arm will be in a sling following surgery. You will be in this sling for the next 3-4 weeks.  I will let you know the exact duration at your follow-up visit.  You may be more comfortable sleeping in a semi-seated position the first few nights following surgery.  Keep a pillow propped under the elbow and forearm for comfort.  If you have a recliner type of chair it might be beneficial.  If not that is fine too, but it would be helpful to sleep propped up with pillows behind your operated shoulder as well under your elbow and forearm.  This will reduce pulling on the suture lines.  When dressing, put your operative arm in the sleeve first.  When getting undressed, take your operative arm out last.  Loose fitting, button-down shirts are recommended.  In most states it is against the law to drive while your arm is in a sling. And certainly against the law to drive while taking narcotics.  You may return to work/school  in the next couple of days when you feel up to it. Desk work and typing in the sling is fine.  We suggest you use the pain medication the first night prior to going to bed, in order to ease any pain when the anesthesia wears off. You should avoid taking pain medications on an empty stomach as it will make you nauseous.  Do not drink alcoholic beverages or take illicit drugs when taking pain medications.  Pain medication may make you constipated.  Below are a few solutions to try in this order: Decrease the amount of pain medication if you aren't having pain. Drink lots of decaffeinated fluids. Drink prune juice and/or each dried prunes  If the first 3 don't work start with additional solutions Take Colace - an over-the-counter stool softener Take Senokot - an over-the-counter laxative Take Miralax - a stronger over-the-counter laxative   Dental Antibiotics:  In most cases prophylactic antibiotics for Dental procdeures after total joint surgery are not necessary.  Exceptions are as follows:  1. History of prior total joint infection  2. Severely immunocompromised (Organ Transplant, cancer chemotherapy, Rheumatoid biologic meds such as [place])  3. Poorly controlled diabetes (A1C &gt; 8.0, blood glucose over 200)  If you have one of these conditions, contact your surgeon for an antibiotic prescription, prior to your dental procedure.     Allergies as of 11/23/2019      Reactions   Clarithromycin Itching   Morphine And Related Nausea Only      Penicillins Nausea And Vomiting, Other (See Comments)   seizures & convulsions; passed out; last time was when pt was 20  Has patient had a PCN reaction causing immediate rash, facial/tongue/throat swelling, SOB or lightheadedness with hypotension: Yes Has patient had a PCN reaction causing severe rash involving mucus membranes or skin necrosis: No Has patient had a PCN reaction that required hospitalization: No Has patient had a PCN  reaction occurring within the last 10 years: No If all of the above answers are "NO", then m   Tape Other (See Comments)   PLEASE USE  CO[place]AN WRAP; TAPE WILL TEAR OFF THE PATENT'S SKIN!!      Medication List    STOP taking these medications   aspirin 325 MG tablet   oxyCODONE-acetaminophen 5-325 MG tablet Commonly known as: PERCOCET/ROXICET     TAKE these medications   acetaminophen 500 MG tablet Commonly known as: TYLENOL Take 2 tablets (1,000 mg total) by mouth every 8 (eight) hours for 14 days. What changed:   medication strength  how much to take  when to take this  reasons to take this   acyclovir 400 MG tablet Commonly known as: ZOVIRAX Take 400 mg by mouth 3 (three) times daily as needed (fever blisters).   allopurinol 300 MG tablet Commonly known as: ZYLOPRIM Take 300 mg by mouth daily.   Bentyl 10 MG capsule Generic drug: dicyclomine Take 10 mg by mouth 4 (four) times daily as needed for spasms.   benzonatate 100 MG capsule Commonly known as: TESSALON Take 100 mg by mouth 3 (three) times daily as needed for cough.   Biotin 5000 MCG Caps Take 1 capsule by mouth daily.   budesonide 0.5 MG/2ML nebulizer solution Commonly known as: PULMICORT Take 0.5 mg by nebulization 2 (two) times daily.   Calcium 500 1250 (500 Ca) MG tablet Generic drug: calcium carbonate Take 1,250 mg by mouth daily.   cetirizine 10 MG tablet Commonly known as: ZYRTEC Take 10 mg by mouth daily.   cholecalciferol 10 MCG (400 UNIT) Tabs tablet Commonly known as: VITAMIN D3 Take 400 Units by mouth daily.   dexamethasone 4 MG tablet Commonly known as: DECADRON Take 4 mg by mouth 2 (two) times daily as needed (pain).   fluticasone 50 MCG/ACT nasal spray Commonly known as: FLONASE Place 1 spray into both nostrils 2 (two) times daily.   guaiFENesin 600 MG 12 hr tablet Commonly known as: MUCINEX Take 600 mg by mouth daily.   hydroxypropyl methylcellulose / hypromellose 2.5 %  ophthalmic solution Commonly known as: ISOPTO TEARS / GONIOVISC Place 1 drop into both eyes 4 (four) times daily as needed for dry eyes.   montelukast 10 MG tablet Commonly known as: SINGULAIR Take 10 mg by mouth daily.   nabumetone 500 MG tablet Commonly known as: RELAFEN Take 500 mg by mouth 2 (two) times daily as needed for moderate pain.   NON FORMULARY CPAP machine at bedtime   ondansetron 4 MG tablet Commonly known as: Zofran Take 1 tablet (4 mg total) by mouth every 8 (eight) hours as needed for up to 7 days for nausea or vomiting. What changed: when to take this   oxyCODONE 5 MG immediate release tablet Commonly known as: Oxy IR/ROXICODONE Take 1-2 pills every 6 hrs as needed for pain, no more than 6 per day   pantoprazole 40 MG tablet Commonly known as: PROTONIX Take 40 mg by mouth daily before breakfast.   potassium chloride SA 20 MEQ tablet Commonly known as: KLOR-CON Take 20 mEq by mouth 2 (two) times daily.   ProAir HFA 108 (90 Base) MCG/ACT inhaler Generic drug: albuterol Inhale 2 puffs into the lungs daily as needed for wheezing or shortness of breath.   rivaroxaban 10 MG Tabs tablet Commonly known as: Xarelto Take 1 tablet (10 mg total) by mouth daily.   Sinus Rinse Pack Place 1 each into the nose daily.   traZODone 100 MG tablet Commonly known as: DESYREL Take 50-100 mg by mouth at bedtime as needed for sleep.   triamterene-hydrochlorothiazide 75-50 MG tablet Commonly known  as: MAXZIDE Take 1 tablet by mouth 2 (two) times daily.   UNABLE TO FIND Rx: L8000- Post Surgical Bras (Quantity: 6) Dx: 174.9; Left Mastectomy   vitamin C 1000 MG tablet Take 1,000 mg by mouth daily.

## 2019-11-23 NOTE — Progress Notes (Signed)
Pt and pt's Daughter provided with d/c instructions. After discussing the pt's plan of care upon d/c home, the pt and Daughter denied any further questions or concerns.

## 2019-11-23 NOTE — Evaluation (Signed)
Occupational Therapy Evaluation Patient Details Name: CROSBY GACHUPIN MRN: [id number] DOB: 1941-06-06 Today's Date: 11/23/2019    History of Present Illness Patient is a 79 year old female admitted for right reverse total shoulder arthroplasty   Clinical Impression   Patient educated in post op shoulder instructions listed below and provided handouts for distal AROM exercises. Patient required min A for UB dressing and LB dressing, verbalizes good understanding of compensatory strategies and shoulder precautions. Answered all patients questions/concerns.    Follow Up Recommendations  Follow surgeon's recommendation for DC plan and follow-up therapies    Equipment Recommendations  None recommended by OT       Precautions / Restrictions Precautions Precautions: Shoulder Type of Shoulder Precautions: AROM elbow, wrist, hand, no P/AROM shoulder Shoulder Interventions: Shoulder sling/immobilizer;Off for dressing/bathing/exercises;Shoulder abduction pillow Precaution Booklet Issued: Yes (comment) Required Braces or Orthoses: Sling Restrictions Weight Bearing Restrictions: Yes RUE Weight Bearing: Non weight bearing      Mobility Bed Mobility Overal bed mobility: Modified Independent                Transfers Overall transfer level: Needs assistance Equipment used: None Transfers: Sit to/from Stand Sit to Stand: Supervision         General transfer comment: S for safety    Balance Overall balance assessment: Mild deficits observed, not formally tested                                         ADL either performed or assessed with clinical judgement   ADL Overall ADL's : Needs assistance/impaired Eating/Feeding: Set up;Sitting   Grooming: Set up;Sitting   Upper Body Bathing: Minimal assistance;Sitting   Lower Body Bathing: Sitting/lateral leans;Sit to/from stand;Supervison/ safety   Upper Body Dressing : Minimal assistance;Sitting;Cueing for  compensatory techniques   Lower Body Dressing: Minimal assistance;Sitting/lateral leans;Sit to/from stand   Toilet Transfer: Supervision/safety;Ambulation Toilet Transfer Details (indicate cue type and reason): simulated with transfer to [place] and Hygiene: Supervision/safety;Sitting/lateral lean;Sit to/from stand       Functional mobility during ADLs: Supervision/safety       Vision Baseline Vision/History: Wears glasses Wears Glasses: At all times              Pertinent Vitals/Pain Pain Assessment: No/denies pain     Hand Dominance Right   Extremity/Trunk Assessment Upper Extremity Assessment Upper Extremity Assessment: RUE deficits/detail RUE Deficits / Details: + nerve block no AROM wrist, elbow, minimal in hand RUE Sensation: decreased light touch RUE Coordination: decreased fine motor;decreased gross motor   Lower Extremity Assessment Lower Extremity Assessment: Overall WFL for tasks assessed   Cervical / Trunk Assessment Cervical / Trunk Assessment: Normal   Communication Communication Communication: No difficulties   Cognition Arousal/Alertness: Awake/alert Behavior During Therapy: WFL for tasks assessed/performed Overall Cognitive Status: Within Functional Limits for tasks assessed                                        Exercises Exercises: Shoulder   Shoulder Instructions Shoulder Instructions Donning/doffing shirt without moving shoulder: Minimal assistance;Patient able to independently direct caregiver Method for sponge bathing under operated UE: Minimal assistance;Patient able to independently direct caregiver Donning/doffing sling/immobilizer: Maximal assistance;Patient able to independently direct caregiver Correct positioning of sling/immobilizer: Independent;Patient able to independently direct caregiver ROM for  elbow, wrist and digits of operated UE: Independent Sling wearing schedule (on at  all times/off for ADL's): Independent;Patient able to independently direct caregiver Proper positioning of operated UE when showering: Supervision/safety;Patient able to independently direct caregiver Positioning of UE while sleeping: Patient able to independently direct caregiver    Home Living Family/patient expects to be discharged to:: Private residence Living Arrangements: Spouse/significant other Available Help at Discharge: Family;Available 24 hours/day Type of Home: House Home Access: Stairs to enter [employer] of Steps: 1   Home Layout: Laundry or work area in basement;Two level     Bathroom Shower/Tub: Tub/shower unit;Walk-in shower         Home Equipment: Cane - quad;Shower seat          Prior Functioning/Environment Level of Independence: Independent                 OT Problem List: Impaired UE functional use         OT Goals(Current goals can be found in the care plan section) Acute Rehab OT Goals Patient Stated Goal: to regain independence OT Goal Formulation: With patient Time For Goal Achievement: 12/07/19 Potential to Achieve Goals: Good   AM-PAC OT "6 Clicks" Daily Activity     Outcome Measure Help from another person eating meals?: A Little Help from another person taking care of personal grooming?: A Little Help from another person toileting, which includes using toliet, bedpan, or urinal?: A Little Help from another person bathing (including washing, rinsing, drying)?: A Little Help from another person to put on and taking off regular upper body clothing?: A Little Help from another person to put on and taking off regular lower body clothing?: A Little 6 Click Score: 18   End of Session Equipment Utilized During Treatment: Other (comment)(sling) Nurse Communication: Mobility status  Activity Tolerance: Patient tolerated treatment well Patient left: in chair;with call bell/phone within reach  OT Visit Diagnosis: Muscle  weakness (generalized) (M62.81)                Time: [id number] OT Time Calculation (min): 52 min Charges:  OT General Charges $OT Visit: 1 Visit OT Evaluation $OT Eval Moderate Complexity: 1 Mod  Delbert Phenix OT Pager: [email or web address] 11/23/2019, 9:13 AM

## 2019-12-06 NOTE — Telephone Encounter (Signed)
Per Pre Op Provider this surgery has been completed already. I will remove from the pre op call back pool.

## 2019-12-06 NOTE — Telephone Encounter (Signed)
Pre-op callback  This patient was previously scheduled for

## 2020-01-03 ENCOUNTER — Other Ambulatory Visit: Payer: Self-pay | Admitting: General Surgery

## 2020-01-03 DIAGNOSIS — Z1231 Encounter for screening mammogram for malignant neoplasm of breast: Secondary | ICD-10-CM

## 2020-01-26 ENCOUNTER — Ambulatory Visit
Admission: RE | Admit: 2020-01-26 | Discharge: 2020-01-26 | Disposition: A | Discharge status: Home / Self Care | Payer: Medicare Other | Source: Ambulatory Visit | Attending: General Surgery | Admitting: General Surgery

## 2020-01-26 ENCOUNTER — Other Ambulatory Visit: Payer: Self-pay

## 2020-01-26 DIAGNOSIS — Z1231 Encounter for screening mammogram for malignant neoplasm of breast: Secondary | ICD-10-CM

## 2021-01-08 ENCOUNTER — Other Ambulatory Visit: Payer: Self-pay | Admitting: General Surgery

## 2021-01-08 DIAGNOSIS — Z1231 Encounter for screening mammogram for malignant neoplasm of breast: Secondary | ICD-10-CM

## 2021-03-04 ENCOUNTER — Other Ambulatory Visit: Payer: Self-pay | Admitting: General Surgery

## 2021-03-04 ENCOUNTER — Other Ambulatory Visit: Payer: Self-pay

## 2021-03-04 ENCOUNTER — Ambulatory Visit
Admission: RE | Admit: 2021-03-04 | Discharge: 2021-03-04 | Disposition: A | Discharge status: Home / Self Care | Payer: Medicare Other | Source: Ambulatory Visit | Attending: General Surgery | Admitting: General Surgery

## 2021-03-04 DIAGNOSIS — Z1231 Encounter for screening mammogram for malignant neoplasm of breast: Secondary | ICD-10-CM

## 2021-08-19 ENCOUNTER — Ambulatory Visit
Admission: RE | Admit: 2021-08-19 | Discharge: 2021-08-19 | Disposition: A | Discharge status: Home / Self Care | Payer: Medicare Other | Source: Ambulatory Visit | Attending: Nurse Practitioner | Admitting: Nurse Practitioner

## 2021-08-19 ENCOUNTER — Other Ambulatory Visit: Payer: Self-pay | Admitting: Nurse Practitioner

## 2021-08-19 DIAGNOSIS — J45909 Unspecified asthma, uncomplicated: Secondary | ICD-10-CM

## 2021-10-19 NOTE — Progress Notes (Deleted)
?Cardiology Office Note:   ? ?Date:  10/19/2021  ? ?ID:  Lydia Campbell, DOB 1940/09/26, MRN [id number] ? ?PCP:  Galen Manila, MD ?  ?[facility] Providers ?Cardiologist:  Ena Dawley, MD { ? ? ?Referring MD: Galen Manila, MD  ? ? ?History of Present Illness:   ? ?Lydia Campbell is a 81 y.o. female with a hx of nonobstructive CAD by cath in 2016, PE in 2012 previously on warfarin, obesity, breast CA s/p chemo, anxiety, asthma, fibromyalgia, GERD, hiatal hernia, OSA on CPAP, osteoarthritis who presents to clinic for follow-up. Was previously followed by Dr. Meda Coffee.  ? ?Per review of the record, the patient has a remote history of  chest discomfort with abnormal stress test. Cardiac cath 10/26/14 showed 20% OM and minor luminal irregularities of the RCA with EF 60%. 2D Echo was done as well with EF 55-60%, no RWMA, trivial MR/PR. Her chest pain was previously felt to be related to an asthma exacerbation or anxiety. She also has chronic mild lower extremity edema. Event monitor in 11/2016 was normal. ? ?She was last seen in clinic on 10/2019 by Richardson Dopp, PA where she was doing well from a CV standpoint. ? ?Today, *** ? ?Past Medical History:  ?Diagnosis Date  ? Anxiety   ? Asthma   ? Blood transfusion 07/2008  ? "related to chemo" (07/25/2012)  ? Breast cancer ([place])   ? breast lft  ? Chronic edema   ? Chronic lower back pain   ? Clotting disorder ([place])   ? "sudden chest pain; found multiple blood clots in both lungs; they think it was from Tamoxifen" (07/25/2012)  ? Complication of anesthesia   ? Hard to wake up  ? DDD (degenerative disc disease), lumbosacral   ? Fibromyalgia   ? GERD (gastroesophageal reflux disease)   ? Gout   ? H/O hiatal hernia   ? Heart murmur   ? Hx of blood clots 02/2011  ? "sudden chest pain; found multiple blood clots in both lungs; they think it was from Tamoxifen; haven't had any since 11/2011" (07/25/2012)  ? Hypertension   ? pt denies  ? Left knee DJD   ? Lymph edema   ? Left  arm after mastectomy  ? Mild CAD   ? pt denies  ? OSA on CPAP   ? Moderate  ? Osteoarthritis   ? "pretty much everywhere" (07/25/2012)  ? Pulmonary embolism ([place]) 02/2011  ? after chemo  ? Right knee DJD   ? Seasonal allergies   ? Seizures ([place])   ? "seizures vs convulsions related to PCN when I was a child" (07/25/2012)  ? Subcutaneous hematoma 07/25/2012  ? Intraoperative cultures are pending on this fluid collection that was just outside the left knee joint.  Gram stain had few WBC monos and polys.  ? Varicose veins of both legs with edema   ? ? ?Past Surgical History:  ?Procedure Laterality Date  ? BREAST BIOPSY Left 03/05/2008  ? malignant  ? BREAST LUMPECTOMY  02/2008  ? "left" (07/25/2012)  ? BREAST SURGERY    ? KNEE ARTHROSCOPY  2001;  2009  ? "right; left" (07/25/2012)  ? LEFT HEART CATHETERIZATION WITH CORONARY ANGIOGRAM N/A 10/26/2014  ? Procedure: LEFT HEART CATHETERIZATION WITH CORONARY ANGIOGRAM;  Surgeon: Burnell Blanks, MD;  Location: [facility] CATH LAB;  Service: Cardiovascular;  Laterality: N/A;  ? MASTECTOMY MODIFIED RADICAL / SIMPLE / COMPLETE Left 03/28/2008  ? "left" (07/25/2012)  ? PORTACATH PLACEMENT  03/28/2008; ?05/2008  ? "left; right" (07/25/2012)  ? REVERSE SHOULDER ARTHROPLASTY Right 11/22/2019  ? Procedure: REVERSE SHOULDER ARTHROPLASTY;  Surgeon: Hiram Gash, MD;  Location: [facility];  Service: Orthopedics;  Laterality: Right;  ? TONSILLECTOMY  1950  ? TOTAL KNEE ARTHROPLASTY  12/21/2011  ? Procedure: TOTAL KNEE ARTHROPLASTY;  Surgeon: Lorn Junes, MD;  Location: [place];  Service: Orthopedics;  Laterality: Right;  DR Adel THIS CASE  ? TOTAL KNEE ARTHROPLASTY WITH REVISION COMPONENTS  07/25/2012  ? Procedure: TOTAL KNEE ARTHROPLASTY WITH REVISION COMPONENTS;  Surgeon: Lorn Junes, MD;  Location: [place];  Service: Orthopedics;  Laterality: Left;  ? TUBAL LIGATION  ~ 1976  ? VAGINAL HYSTERECTOMY  ~ 1983  ? "endometrosis" (07/25/2012)  ? ? ?Current Medications: ?No outpatient medications  have been marked as taking for the 10/22/21 encounter (Appointment) with Freada Bergeron, MD.  ?  ? ?Allergies:   Clarithromycin, Morphine and related, Penicillins, and Tape  ? ?Social History  ? ?Socioeconomic History  ? Marital status: Married  ?  Spouse name: Not on file  ? Number of children: 1  ? Years of education: Not on file  ? Highest education level: Not on file  ?Occupational History  ? Occupation: retired  ?Tobacco Use  ? Smoking status: Former  ?  Packs/day: 1.50  ?  Years: 5.00  ?  Pack years: 7.50  ?  Types: Cigarettes  ?  Quit date: 12/06/1968  ?  Years since quitting: 52.9  ? Smokeless tobacco: Never  ?Vaping Use  ? Vaping Use: Never used  ?Substance and Sexual Activity  ? Alcohol use: No  ?  Comment: 07/25/2012 "bottle of champaign on our anniversary q yr; add a drink once in a blue moon for a celebration"  ? Drug use: No  ? Sexual activity: Yes  ?  Partners: Male  ?  Birth control/protection: Surgical  ?Other Topics Concern  ? Not on file  ?Social History Narrative  ? Not on file  ? ?Social Determinants of Health  ? ?Financial Resource Strain: Not on file  ?Food Insecurity: Not on file  ?Transportation Needs: Not on file  ?Physical Activity: Not on file  ?Stress: Not on file  ?Social Connections: Not on file  ?  ? ?Family History: ?The patient's ***family history includes Breast cancer in her cousin; CVA in her father; Colon polyps in her sister; Diabetes in her father; Heart disease in her brother, father, mother, and sister. There is no history of Colon cancer. ? ?ROS:   ?Please see the history of present illness.    ?*** All other systems reviewed and are negative. ? ?EKGs/Labs/Other Studies Reviewed:   ? ?The following studies were reviewed today: ?Event monitor 11/18/2016 ?Normal ?  ?Myoview 11/11/2016 ?No ischemia, EF 63; low risk ?  ?Cardiac catheterization 10/26/2014 ?OM 20 ?RCA minor irregularities ?EF 60 ?  ?Myoview 10/23/2014 ?EF 65, inferior ischemia; intermediate risk ?  ?Echocardiogram  10/22/2014 ?EF 55-60, normal wall motion, trivial MR, trivial PI ? ?EKG:  EKG is *** ordered today.  The ekg ordered today demonstrates *** ? ?Recent Labs: ?No results found for requested labs within last 8760 hours.  ?Recent Lipid Panel ?   ?Component Value Date/Time  ? CHOL 175 11/11/2016 0244  ? TRIG 116 11/11/2016 0244  ? HDL 42 11/11/2016 0244  ? CHOLHDL 4.2 11/11/2016 0244  ? VLDL 23 11/11/2016 0244  ? [place] 110 (H) 11/11/2016 0244  ? ? ? ?  Risk Assessment/Calculations:   ?{Does this patient have ATRIAL FIBRILLATION?:[phone]} ? ?    ? ?Physical Exam:   ? ?VS:  LMP  (LMP Unknown)    ? ?Wt Readings from Last 3 Encounters:  ?11/22/19 214 lb (97.1 kg)  ?11/15/19 214 lb (97.1 kg)  ?11/15/19 200 lb (90.7 kg)  ?  ? ?GEN: *** Well nourished, well developed in no acute distress ?HEENT: Normal ?NECK: No JVD; No carotid bruits ?LYMPHATICS: No lymphadenopathy ?CARDIAC: ***RRR, no murmurs, rubs, gallops ?RESPIRATORY:  Clear to auscultation without rales, wheezing or rhonchi  ?ABDOMEN: Soft, non-tender, non-distended ?MUSCULOSKELETAL:  No edema; No deformity  ?SKIN: Warm and dry ?NEUROLOGIC:  Alert and oriented x 3 ?PSYCHIATRIC:  Normal affect  ? ?ASSESSMENT:   ? ?No diagnosis found. ?PLAN:   ? ?In order of problems listed above: ? ?#Mild CAD: ?Mild disease on cath in 2016.  ? ?#History of PE: ?Thought to be provoked in the setting of tamoxifen use. Was on warfarin now transitioned to ASA. ? ?#HTN: ?-Continue triamterene-HCTZ 75-'50mg'$  daily ? ?#Chronic LE edema: ?Stable.  ? ?#History of Breast Cancer: ?S/p chemotherapy. In remission. ?   ? ?{Are you ordering a CV Procedure (e.g. stress test, cath, DCCV, TEE, etc)?   Press F2        :[id number]}  ? ? ?Medication Adjustments/Labs and Tests Ordered: ?Current medicines are reviewed at length with the patient today.  Concerns regarding medicines are outlined above.  ?No orders of the defined types were placed in this encounter. ? ?No orders of the defined types were placed in  this encounter. ? ? ?There are no Patient Instructions on file for this visit.  ? ?Signed, ?Freada Bergeron, MD  ?10/19/2021 10:16 AM    ?[place] ?

## 2021-10-22 ENCOUNTER — Ambulatory Visit: Payer: Medicare Other | Admitting: Cardiology

## 2021-10-22 ENCOUNTER — Encounter: Payer: Self-pay | Admitting: Cardiology

## 2021-10-22 VITALS — BP 126/72 | HR 78 | Ht 68.0 in | Wt 201.6 lb

## 2021-10-22 DIAGNOSIS — I2510 Atherosclerotic heart disease of native coronary artery without angina pectoris: Secondary | ICD-10-CM

## 2021-10-22 DIAGNOSIS — Z86711 Personal history of pulmonary embolism: Secondary | ICD-10-CM | POA: Diagnosis not present

## 2021-10-22 DIAGNOSIS — R600 Localized edema: Secondary | ICD-10-CM

## 2021-10-22 DIAGNOSIS — Z853 Personal history of malignant neoplasm of breast: Secondary | ICD-10-CM

## 2021-10-22 DIAGNOSIS — I10 Essential (primary) hypertension: Secondary | ICD-10-CM

## 2021-10-22 MED ORDER — TRIAMTERENE-HCTZ 37.5-25 MG PO TABS: 1.0000 | ORAL_TABLET | Freq: Every day | ORAL | 3 refills | Status: DC

## 2021-10-22 MED ORDER — TRIAMTERENE-HCTZ 37.5-25 MG PO TABS: 1.0000 | ORAL_TABLET | Freq: Two times a day (BID) | ORAL | 3 refills | Status: DC

## 2021-10-22 MED ORDER — POTASSIUM CHLORIDE CRYS ER 20 MEQ PO TBCR: 20.0000 meq | EXTENDED_RELEASE_TABLET | Freq: Every day | ORAL | 3 refills | Status: DC

## 2021-10-22 NOTE — Progress Notes (Signed)
?Cardiology Office Note:   ? ?Date:  10/22/2021  ? ?ID:  Lydia Campbell, DOB 13-Jul-1941, MRN [id number] ? ?PCP:  Galen Manila, MD ?  ?[facility] Providers ?Cardiologist:  Ena Dawley, MD { ? ? ?Referring MD: Galen Manila, MD  ? ? ?History of Present Illness:   ? ?Lydia Campbell is a 81 y.o. female with a hx of nonobstructive CAD by cath in 2016, PE in 2012 previously on warfarin, obesity, breast CA s/p chemo, anxiety, asthma, fibromyalgia, GERD, hiatal hernia, OSA on CPAP, osteoarthritis who presents to clinic for follow-up. Was previously followed by Dr. Meda Coffee.  ? ?Per review of the record, the patient has a remote history of  chest discomfort with abnormal stress test. Cardiac cath 10/26/14 showed 20% OM and minor luminal irregularities of the RCA with EF 60%. 2D Echo was done as well with EF 55-60%, no RWMA, trivial MR/PR. Her chest pain was previously felt to be related to an asthma exacerbation or anxiety. She also has chronic mild lower extremity edema. Event monitor in 11/2016 was normal. ? ?She was last seen in clinic on 10/2019 by Richardson Dopp, PA where she was doing well from a CV standpoint. ? ?Today, she is doing well. She continues to swell. She tried compression socks but is uncomfortable in them. Compression tights, however, do improve the swelling. She tried Lasix for a month but it caused her to feel dizzy. Currently, she is taking 1/2 a tablet of Maxzide daily, which is controlling her symptoms.  ? ?She endorses an episode of syncope last Summer while working on the yard. She had started Lasix 2 days prior. She did try in Lasix again in the Winter but continued to feel dizzy. She has not had a syncopal episode since stopping the medication. Otherwise, she denies chest pressure, dyspnea at rest or with exertion, palpitations, PND, or orthopnea. She stays active but has some mild DOE which is stable for her. ? ? ?Past Medical History:  ?Diagnosis Date  ? Anxiety   ? Asthma   ?  Blood transfusion 07/2008  ? "related to chemo" (07/25/2012)  ? Breast cancer ([place])   ? breast lft  ? Chronic edema   ? Chronic lower back pain   ? Clotting disorder ([place])   ? "sudden chest pain; found multiple blood clots in both lungs; they think it was from Tamoxifen" (07/25/2012)  ? Complication of anesthesia   ? Hard to wake up  ? DDD (degenerative disc disease), lumbosacral   ? Fibromyalgia   ? GERD (gastroesophageal reflux disease)   ? Gout   ? H/O hiatal hernia   ? Heart murmur   ? Hx of blood clots 02/2011  ? "sudden chest pain; found multiple blood clots in both lungs; they think it was from Tamoxifen; haven't had any since 11/2011" (07/25/2012)  ? Hypertension   ? pt denies  ? Left knee DJD   ? Lymph edema   ? Left arm after mastectomy  ? Mild CAD   ? pt denies  ? OSA on CPAP   ? Moderate  ? Osteoarthritis   ? "pretty much everywhere" (07/25/2012)  ? Pulmonary embolism ([place]) 02/2011  ? after chemo  ? Right knee DJD   ? Seasonal allergies   ? Seizures ([place])   ? "seizures vs convulsions related to PCN when I was a child" (07/25/2012)  ? Subcutaneous hematoma 07/25/2012  ? Intraoperative cultures are pending on this fluid collection that was just  outside the left knee joint.  Gram stain had few WBC monos and polys.  ? Varicose veins of both legs with edema   ? ? ?Past Surgical History:  ?Procedure Laterality Date  ? BREAST BIOPSY Left 03/05/2008  ? malignant  ? BREAST LUMPECTOMY  02/2008  ? "left" (07/25/2012)  ? BREAST SURGERY    ? KNEE ARTHROSCOPY  2001;  2009  ? "right; left" (07/25/2012)  ? LEFT HEART CATHETERIZATION WITH CORONARY ANGIOGRAM N/A 10/26/2014  ? Procedure: LEFT HEART CATHETERIZATION WITH CORONARY ANGIOGRAM;  Surgeon: Burnell Blanks, MD;  Location[facility] CATH LAB;  Service: Cardiovascular;  Laterality: N/A;  ? MASTECTOMY MODIFIED RADICAL / SIMPLE / COMPLETE Left 03/28/2008  ? "left" (07/25/2012)  ? PORTACATH PLACEMENT  03/28/2008; ?05/2008  ? "left; right" (07/25/2012)  ? REVERSE SHOULDER ARTHROPLASTY Right 11/22/2019   ? Procedure: REVERSE SHOULDER ARTHROPLASTY;  Surgeon: Hiram Gash, MD;  Location: [facility];  Service: Orthopedics;  Laterality: Right;  ? TONSILLECTOMY  1950  ? TOTAL KNEE ARTHROPLASTY  12/21/2011  ? Procedure: TOTAL KNEE ARTHROPLASTY;  Surgeon: Lorn Junes, MD;  Location: [place];  Service: Orthopedics;  Laterality: Right;  DR Williams THIS CASE  ? TOTAL KNEE ARTHROPLASTY WITH REVISION COMPONENTS  07/25/2012  ? Procedure: TOTAL KNEE ARTHROPLASTY WITH REVISION COMPONENTS;  Surgeon: Lorn Junes, MD;  Location: [place];  Service: Orthopedics;  Laterality: Left;  ? TUBAL LIGATION  ~ 1976  ? VAGINAL HYSTERECTOMY  ~ 1983  ? "endometrosis" (07/25/2012)  ? ? ?Current Medications: ?Current Meds  ?Medication Sig  ? acyclovir (ZOVIRAX) 400 MG tablet Take 400 mg by mouth 3 (three) times daily as needed (fever blisters).   ? albuterol (VENTOLIN HFA) 108 (90 Base) MCG/ACT inhaler Inhale 2 puffs into the lungs daily as needed for wheezing or shortness of breath.  ? allopurinol (ZYLOPRIM) 300 MG tablet Take 300 mg by mouth daily.   ? Ascorbic Acid (VITAMIN C) 1000 MG tablet Take 1,000 mg by mouth daily.  ? benzonatate (TESSALON) 100 MG capsule Take 100 mg by mouth 3 (three) times daily as needed for cough.   ? Biotin 5000 MCG CAPS Take 1 capsule by mouth daily.   ? budesonide (PULMICORT) 0.5 MG/2ML nebulizer solution Take 0.5 mg by nebulization 2 (two) times daily.  ? calcium carbonate (OS-CAL - DOSED IN MG OF ELEMENTAL CALCIUM) 1250 (500 Ca) MG tablet Take 1,250 mg by mouth daily.   ? cetirizine (ZYRTEC) 10 MG tablet Take 10 mg by mouth daily.   ? cholecalciferol (VITAMIN D) 400 UNITS TABS Take 400 Units by mouth daily.   ? dexamethasone (DECADRON) 4 MG tablet Take 4 mg by mouth 2 (two) times daily as needed (pain).   ? dicyclomine (BENTYL) 10 MG capsule Take 10 mg by mouth 4 (four) times daily as needed for spasms.  ? fluticasone (FLONASE) 50 MCG/ACT nasal spray Place 1 spray into both nostrils 2 (two) times  daily.  ? gabapentin (NEURONTIN) 100 MG capsule Take 100 mg by mouth 3 (three) times daily.  ? guaiFENesin (MUCINEX) 600 MG 12 hr tablet Take 600 mg by mouth daily.  ? hydroxypropyl methylcellulose (ISOPTO TEARS) 2.5 % ophthalmic solution Place 1 drop into both eyes 4 (four) times daily as needed for dry eyes.   ? Hypertonic Nasal Wash (SINUS RINSE) PACK Place 1 each into the nose daily.   ? montelukast (SINGULAIR) 10 MG tablet Take 10 mg by mouth daily.  ? nabumetone (RELAFEN) 500  MG tablet Take 500 mg by mouth 2 (two) times daily as needed for moderate pain.   ? NON FORMULARY CPAP machine at bedtime  ? pantoprazole (PROTONIX) 40 MG tablet Take 40 mg by mouth daily before breakfast.  ? potassium chloride SA (KLOR-CON M) 20 MEQ tablet Take 1 tablet (20 mEq total) by mouth daily.  ? traZODone (DESYREL) 100 MG tablet Take 50-100 mg by mouth at bedtime as needed for sleep.  ? triamterene-hydrochlorothiazide (MAXZIDE-25) 37.5-25 MG tablet Take 1 tablet by mouth 2 (two) times daily.  ? UNABLE TO FIND Rx: [id number] Post Surgical Bras (Quantity: 6) ?Dx: 174.9; Left Mastectomy  ? [DISCONTINUED] potassium chloride SA (K-DUR,KLOR-CON) 20 MEQ tablet Take 20 mEq by mouth 2 (two) times daily.  ? [DISCONTINUED] triamterene-hydrochlorothiazide (MAXZIDE) 75-50 MG tablet Take 1 tablet by mouth 2 (two) times daily.  ?  ? ?Allergies:   Clarithromycin, Morphine and related, Penicillins, and Tape  ? ?Social History  ? ?Socioeconomic History  ? Marital status: Married  ?  Spouse name: Not on file  ? Number of children: 1  ? Years of education: Not on file  ? Highest education level: Not on file  ?Occupational History  ? Occupation: retired  ?Tobacco Use  ? Smoking status: Former  ?  Packs/day: 1.50  ?  Years: 5.00  ?  Pack years: 7.50  ?  Types: Cigarettes  ?  Quit date: 12/06/1968  ?  Years since quitting: 52.9  ? Smokeless tobacco: Never  ?Vaping Use  ? Vaping Use: Never used  ?Substance and Sexual Activity  ? Alcohol use: No  ?  Comment:  07/25/2012 "bottle of champaign on our anniversary q yr; add a drink once in a blue moon for a celebration"  ? Drug use: No  ? Sexual activity: Yes  ?  Partners: Male  ?  Birth control/protection: Surgical

## 2021-10-22 NOTE — Patient Instructions (Addendum)
Medication Instructions:  ? ?DECREASE YOUR POTASSIUM CHLORIDE TO 20 mEq BY MOUTH DAILY ? ?DECREASE YOUR MAXZIDE TO THE 37.5 MG-25 MG DOSE-TAKE ONE TABLET BY MOUTH ONCE DAILY  ? ?*If you need a refill on your cardiac medications before your next appointment, please call your pharmacy* ? ? ? ?Follow-Up: ?At [facility], you and your health needs are our priority.  As part of our continuing mission to provide you with exceptional heart care, we have created designated Provider Care Teams.  These Care Teams include your primary Cardiologist (physician) and Advanced Practice Providers (APPs -  Physician Assistants and Nurse Practitioners) who all work together to provide you with the care you need, when you need it. ? ?We recommend signing up for the patient portal called "MyChart".  Sign up information is provided on this After Visit Summary.  MyChart is used to connect with patients for Virtual Visits (Telemedicine).  Patients are able to view lab/test results, encounter notes, upcoming appointments, etc.  Non-urgent messages can be sent to your provider as well.   ?To learn more about what you can do with MyChart, go to [email or web address].   ? ?Your next appointment:   ?1 year(s) ? ?The format for your next appointment:   ?In Person ? ?Provider:   ?DR. PEMBERTON ? ?

## 2021-10-22 NOTE — Addendum Note (Signed)
Addended by: Nuala Alpha on: 10/22/2021 03:44 PM ? ? Modules accepted: Orders ? ?

## 2022-02-05 ENCOUNTER — Other Ambulatory Visit: Payer: Self-pay | Admitting: Internal Medicine

## 2022-02-05 DIAGNOSIS — Z1231 Encounter for screening mammogram for malignant neoplasm of breast: Secondary | ICD-10-CM

## 2022-03-05 ENCOUNTER — Ambulatory Visit: Payer: Medicare Other

## 2022-03-13 ENCOUNTER — Ambulatory Visit
Admission: RE | Admit: 2022-03-13 | Discharge: 2022-03-13 | Disposition: A | Discharge status: Home / Self Care | Payer: Medicare Other | Source: Ambulatory Visit | Attending: Internal Medicine | Admitting: Internal Medicine

## 2022-03-13 DIAGNOSIS — Z1231 Encounter for screening mammogram for malignant neoplasm of breast: Secondary | ICD-10-CM

## 2022-08-13 ENCOUNTER — Other Ambulatory Visit: Payer: Self-pay | Admitting: *Deleted

## 2022-08-13 DIAGNOSIS — I10 Essential (primary) hypertension: Secondary | ICD-10-CM

## 2022-08-13 DIAGNOSIS — I2510 Atherosclerotic heart disease of native coronary artery without angina pectoris: Secondary | ICD-10-CM

## 2022-08-13 MED ORDER — POTASSIUM CHLORIDE CRYS ER 20 MEQ PO TBCR: 20.0000 meq | EXTENDED_RELEASE_TABLET | Freq: Every day | ORAL | 0 refills | Status: DC

## 2022-11-09 ENCOUNTER — Other Ambulatory Visit: Payer: Self-pay | Admitting: *Deleted

## 2022-11-09 DIAGNOSIS — I10 Essential (primary) hypertension: Secondary | ICD-10-CM

## 2022-11-09 DIAGNOSIS — I2510 Atherosclerotic heart disease of native coronary artery without angina pectoris: Secondary | ICD-10-CM

## 2022-11-09 MED ORDER — POTASSIUM CHLORIDE CRYS ER 20 MEQ PO TBCR: 20.0000 meq | EXTENDED_RELEASE_TABLET | Freq: Every day | ORAL | 0 refills | Status: DC

## 2022-12-30 ENCOUNTER — Other Ambulatory Visit: Payer: Self-pay

## 2022-12-30 DIAGNOSIS — I10 Essential (primary) hypertension: Secondary | ICD-10-CM

## 2022-12-30 DIAGNOSIS — I2510 Atherosclerotic heart disease of native coronary artery without angina pectoris: Secondary | ICD-10-CM

## 2022-12-30 MED ORDER — POTASSIUM CHLORIDE CRYS ER 20 MEQ PO TBCR: 20.0000 meq | EXTENDED_RELEASE_TABLET | Freq: Every day | ORAL | 0 refills | Status: DC

## 2023-01-21 NOTE — Progress Notes (Signed)
Cardiology Office Note:    Date:  02/04/2023   ID:  Lydia Campbell, DOB [birth year], MRN [id number]  PCP:  Theodoro Kos, MD   [facility] Providers Cardiologist:  New to me / Shari Prows   Referring MD: Theodoro Kos, MD    History of Present Illness:    Lydia Campbell is a 82 y.o. female with a hx of nonobstructive CAD by cath in 2016, PE in 2012 previously on warfarin, obesity, breast CA s/p chemo, anxiety, asthma, fibromyalgia, GERD, hiatal hernia, OSA on CPAP, osteoarthritis who presents to clinic for follow-up. Was previously followed by Dr. Delton See and Shari Prows   Per review of the record, the patient has a remote history of  chest discomfort with abnormal stress test. Cardiac cath 10/26/14 showed 20% OM and minor luminal irregularities of the RCA with EF 60%. 2D Echo was done as well with EF 55-60%, no RWMA, trivial MR/PR. Her chest pain was previously felt to be related to an asthma exacerbation or anxiety. She also has chronic mild lower extremity edema. Event monitor in 11/2016 was normal.  Today, she is doing well. She continues to swell. She tried compression socks but is uncomfortable in them. Compression tights, however, do improve the swelling. She tried Lasix for a month but it caused her to feel dizzy. Currently, she is taking 1/2 a tablet of Maxzide daily, which is controlling her symptoms.   She endorses an episode of syncope summer 2022 while working on the yard. She had started Lasix 2 days prior. She did try in Lasix again in the Winter but continued to feel dizzy. She has not had a syncopal episode since stopping the medication. Otherwise, she denies chest pressure, dyspnea at rest or with exertion, palpitations, PND, or orthopnea. She stays active but has some mild DOE which is stable for her.  Chronic lymphedema since teens in LEls mild lymphedema in LUE from mastectomy   Past Medical History:  Diagnosis Date   Anxiety    Asthma    Blood transfusion  07/2008   "related to chemo" (07/25/2012)   Breast cancer (HCC)    breast lft   Chronic edema    Chronic lower back pain    Clotting disorder (HCC)    "sudden chest pain; found multiple blood clots in both lungs; they think it was from Tamoxifen" (07/25/2012)   Complication of anesthesia    Hard to wake up   DDD (degenerative disc disease), lumbosacral    Fibromyalgia    GERD (gastroesophageal reflux disease)    Gout    H/O hiatal hernia    Heart murmur    Hx of blood clots 02/2011   "sudden chest pain; found multiple blood clots in both lungs; they think it was from Tamoxifen; haven't had any since 11/2011" (07/25/2012)   Hypertension    pt denies   Left knee DJD    Lymph edema    Left arm after mastectomy   Mild CAD    pt denies   OSA on CPAP    Moderate   Osteoarthritis    "pretty much everywhere" (07/25/2012)   Pulmonary embolism (HCC) 02/2011   after chemo   Right knee DJD    Seasonal allergies    Seizures (HCC)    "seizures vs convulsions related to PCN when I was a child" (07/25/2012)   Subcutaneous hematoma 07/25/2012   Intraoperative cultures are pending on this fluid collection that was just outside the left knee joint.  Gram stain had few WBC monos and polys.   Varicose veins of both legs with edema     Past Surgical History:  Procedure Laterality Date   BREAST BIOPSY Left 03/05/2008   malignant   BREAST LUMPECTOMY  02/2008   "left" (07/25/2012)   BREAST SURGERY     KNEE ARTHROSCOPY  2001;  2009   "right; left" (07/25/2012)   LEFT HEART CATHETERIZATION WITH CORONARY ANGIOGRAM N/A 10/26/2014   Procedure: LEFT HEART CATHETERIZATION WITH CORONARY ANGIOGRAM;  Surgeon: Kathleene Hazel, MD;  Location: [facility] CATH LAB;  Service: Cardiovascular;  Laterality: N/A;   MASTECTOMY MODIFIED RADICAL / SIMPLE / COMPLETE Left 03/28/2008   "left" (07/25/2012)   PORTACATH PLACEMENT  03/28/2008; ?05/2008   "left; right" (07/25/2012)   REVERSE SHOULDER ARTHROPLASTY Right 11/22/2019   Procedure:  REVERSE SHOULDER ARTHROPLASTY;  Surgeon: Bjorn Pippin, MD;  Location: [facility] ORS;  Service: Orthopedics;  Laterality: Right;   TONSILLECTOMY  1950   TOTAL KNEE ARTHROPLASTY  12/21/2011   Procedure: TOTAL KNEE ARTHROPLASTY;  Surgeon: Nilda Simmer, MD;  Location: [facility] OR;  Service: Orthopedics;  Laterality: Right;  DR Thurston Hole WANTS 90 MINUTES FOR THIS CASE   TOTAL KNEE ARTHROPLASTY WITH REVISION COMPONENTS  07/25/2012   Procedure: TOTAL KNEE ARTHROPLASTY WITH REVISION COMPONENTS;  Surgeon: Nilda Simmer, MD;  Location: [facility] OR;  Service: Orthopedics;  Laterality: Left;   TUBAL LIGATION  ~ 1976   VAGINAL HYSTERECTOMY  ~ 1983   "endometrosis" (07/25/2012)    Current Medications: Current Meds  Medication Sig   acyclovir (ZOVIRAX) 400 MG tablet Take 400 mg by mouth 3 (three) times daily as needed (fever blisters).    albuterol (VENTOLIN HFA) 108 (90 Base) MCG/ACT inhaler Inhale 2 puffs into the lungs daily as needed for wheezing or shortness of breath.   allopurinol (ZYLOPRIM) 300 MG tablet Take 300 mg by mouth daily.    Ascorbic Acid (VITAMIN C) 1000 MG tablet Take 1,000 mg by mouth daily.   benzonatate (TESSALON) 100 MG capsule Take 100 mg by mouth 3 (three) times daily as needed for cough.    Biotin 5000 MCG CAPS Take 1 capsule by mouth daily.    calcium carbonate (OS-CAL - DOSED IN MG OF ELEMENTAL CALCIUM) 1250 (500 Ca) MG tablet Take 1,250 mg by mouth daily.    cetirizine (ZYRTEC) 10 MG tablet Take 10 mg by mouth daily.    cholecalciferol (VITAMIN D) 400 UNITS TABS Take 400 Units by mouth daily.    dexamethasone (DECADRON) 4 MG tablet Take 4 mg by mouth 2 (two) times daily as needed (pain).    dicyclomine (BENTYL) 10 MG capsule Take 10 mg by mouth 4 (four) times daily as needed for spasms.   fluticasone (FLONASE) 50 MCG/ACT nasal spray Place 1 spray into both nostrils 2 (two) times daily.   gabapentin (NEURONTIN) 100 MG capsule Take 100 mg by mouth 3 (three) times daily.   guaiFENesin (MUCINEX) 600  MG 12 hr tablet Take 600 mg by mouth daily.   hydroxypropyl methylcellulose (ISOPTO TEARS) 2.5 % ophthalmic solution Place 1 drop into both eyes 4 (four) times daily as needed for dry eyes.    Hypertonic Nasal Wash (SINUS RINSE) PACK Place 1 each into the nose daily.    montelukast (SINGULAIR) 10 MG tablet Take 10 mg by mouth daily.   nabumetone (RELAFEN) 500 MG tablet Take 500 mg by mouth 2 (two) times daily as needed for moderate pain.    NON FORMULARY CPAP machine  at bedtime   pantoprazole (PROTONIX) 40 MG tablet Take 40 mg by mouth daily before breakfast.   potassium chloride SA (KLOR-CON M) 20 MEQ tablet Take 1 tablet (20 mEq total) by mouth daily.   traZODone (DESYREL) 100 MG tablet Take 50-100 mg by mouth at bedtime as needed for sleep.   triamterene-hydrochlorothiazide (MAXZIDE-25) 37.5-25 MG tablet Take 1 tablet by mouth daily.   UNABLE TO FIND Rx: L8000- Post Surgical Bras (Quantity: 6) Dx: 174.9; Left Mastectomy     Allergies:   Clarithromycin, Morphine and codeine, Penicillins, and Tape   Social History   Socioeconomic History   Marital status: Married    Spouse name: Not on file   Number of children: 1   Years of education: Not on file   Highest education level: Not on file  Occupational History   Occupation: retired  Tobacco Use   Smoking status: Former    Current packs/day: 0.00    Average packs/day: 1.5 packs/day for 5.0 years (7.5 ttl pk-yrs)    Types: Cigarettes    Start date: 12/07/1963    Quit date: 12/06/1968    Years since quitting: 54.2   Smokeless tobacco: Never  Vaping Use   Vaping status: Never Used  Substance and Sexual Activity   Alcohol use: No    Comment: 07/25/2012 "bottle of champaign on our anniversary q yr; add a drink once in a blue moon for a celebration"   Drug use: No   Sexual activity: Yes    Partners: Male    Birth control/protection: Surgical  Other Topics Concern   Not on file  Social History Narrative   Not on file   Social  Determinants of Health   Financial Resource Strain: Not on file  Food Insecurity: Not on file  Transportation Needs: Not on file  Physical Activity: Not on file  Stress: Not on file  Social Connections: Not on file     Family History: The patient's family history includes Breast cancer in her cousin; CVA in her father; Colon polyps in her sister; Diabetes in her father; Heart disease in her brother, father, mother, and sister. There is no history of Colon cancer.  ROS:   Please see the history of present illness.    Review of Systems  Constitutional:  Negative for fever and malaise/fatigue.  HENT:  Negative for congestion and sinus pain.   Eyes:  Negative for blurred vision and double vision.  Respiratory:  Positive for shortness of breath (exertional). Negative for cough.   Cardiovascular:  Positive for leg swelling (bilateral LE). Negative for chest pain, palpitations, orthopnea, claudication and PND.  Gastrointestinal:  Negative for heartburn and nausea.  Genitourinary:  Negative for dysuria and urgency.  Musculoskeletal:  Negative for joint pain and myalgias.  Skin:  Negative for itching and rash.  Neurological:  Negative for dizziness and headaches.  Endo/Heme/Allergies:  Does not bruise/bleed easily.  Psychiatric/Behavioral:  The patient is not nervous/anxious and does not have insomnia.    All other systems reviewed and are negative.  EKGs/Labs/Other Studies Reviewed:    The following studies were reviewed today: Event monitor 11/18/2016 Normal   Myoview 11/11/2016 No ischemia, EF 63; low risk   Cardiac catheterization 10/26/2014 OM 20 RCA minor irregularities EF 60   Myoview 10/23/2014 EF 65, inferior ischemia; intermediate risk   Echocardiogram 10/22/2014 EF 55-60, normal wall motion, trivial MR, trivial PI  EKG:   10/22/21: Sinus rhythm, rate 78 bpm  Recent Labs: No results found for  requested labs within last 365 days.  Recent Lipid Panel    Component Value  Date/Time   CHOL 175 11/11/2016 0244   TRIG 116 11/11/2016 0244   HDL 42 11/11/2016 0244   CHOLHDL 4.2 11/11/2016 0244   VLDL 23 11/11/2016 0244   LDLCALC 110 (H) 11/11/2016 0244     Physical Exam:    VS:  BP (!) 112/50 (BP Location: Right Arm, Patient Position: Sitting, Cuff Size: Large)   Pulse 64   Ht 5\' 7"  (1.702 m)   Wt 206 lb (93.4 kg)   LMP  (LMP Unknown)   SpO2 98%   BMI 32.26 kg/m     Wt Readings from Last 3 Encounters:  02/04/23 206 lb (93.4 kg)  10/22/21 201 lb 9.6 oz (91.4 kg)  11/22/19 214 lb (97.1 kg)    Affect appropriate Healthy:  appears stated age HEENT: normal Neck supple with no adenopathy JVP normal no bruits no thyromegaly Lungs clear with no wheezing and good diaphragmatic motion Heart:  S1/S2 no murmur, no rub, gallop or click PMI normal Prior left mastectomy Abdomen: benighn, BS positve, no tenderness, no AAA no bruit.  No HSM or HJR Distal pulses intact with no bruits Chronic plus 2-3 lymphedema in ankles    Neuro non-focal Skin warm and dry No muscular weakness   PLAN:    In order of problems listed above:  #Mild CAD: Mild disease on cath in 2016. No anginal symptoms.  -Continue lifestyle modifications  #History of PE: Thought to be provoked in the setting of tamoxifen use. Was on warfarin now transitioned to ASA. -Continue ASA 81mg  daily  #HTN: Well controlled and at goal. -Change triamterene-HCTZ to 37.5-25mg  daily as patient is taking this already at home  #Chronic LE edema: Stable. -Triamterene-HCTZ to 37.5-25mg  daily  -Lasix PRN 20 mg for worsening  -Continue K supplementation;  -Continue compression therapy - Update Echo has not been done since 2016 - TSH, lytes and Hct normal 11/03/22   #History of Breast Cancer: S/p chemotherapy. And left mastectomy In remission.     Echo for edema  F/U in a year   Signed, Charlton Haws, MD  02/04/2023 10:56 AM    [facility]

## 2023-01-28 ENCOUNTER — Other Ambulatory Visit: Payer: Self-pay | Admitting: Internal Medicine

## 2023-01-28 DIAGNOSIS — Z1231 Encounter for screening mammogram for malignant neoplasm of breast: Secondary | ICD-10-CM

## 2023-02-04 ENCOUNTER — Ambulatory Visit: Payer: Medicare Other | Attending: Cardiovascular Disease | Admitting: Cardiovascular Disease

## 2023-02-04 VITALS — BP 112/50 | HR 64 | Ht 67.0 in | Wt 206.0 lb

## 2023-02-04 DIAGNOSIS — R600 Localized edema: Secondary | ICD-10-CM

## 2023-02-04 DIAGNOSIS — I2510 Atherosclerotic heart disease of native coronary artery without angina pectoris: Secondary | ICD-10-CM | POA: Diagnosis not present

## 2023-02-04 MED ORDER — FUROSEMIDE 20 MG PO TABS: 20.0000 mg | ORAL_TABLET | ORAL | 3 refills | Status: DC | PRN

## 2023-02-04 NOTE — Patient Instructions (Signed)
Medication Instructions:  Your physician has recommended you make the following change in your medication:  1-TAKE Lasix (furosemide) 20 mg by mouth daily as needed for edema  *If you need a refill on your cardiac medications before your next appointment, please call your pharmacy*   Lab Work: If you have labs (blood work) drawn today and your tests are completely normal, you will receive your results only by: MyChart Message (if you have MyChart) OR A paper copy in the mail If you have any lab test that is abnormal or we need to change your treatment, we will call you to review the results.  Follow-Up: At [facility], you and your health needs are our priority.  As part of our continuing mission to provide you with exceptional heart care, we have created designated Provider Care Teams.  These Care Teams include your primary Cardiologist (physician) and Advanced Practice Providers (APPs -  Physician Assistants and Nurse Practitioners) who all work together to provide you with the care you need, when you need it.  We recommend signing up for the patient portal called "MyChart".  Sign up information is provided on this After Visit Summary.  MyChart is used to connect with patients for Virtual Visits (Telemedicine).  Patients are able to view lab/test results, encounter notes, upcoming appointments, etc.  Non-urgent messages can be sent to your provider as well.   To learn more about what you can do with MyChart, go to [email or web address].    Your next appointment:   6 month(s)  Provider:   Charlton Haws, MD

## 2023-02-23 ENCOUNTER — Other Ambulatory Visit: Payer: Self-pay | Admitting: Cardiovascular Disease

## 2023-02-23 DIAGNOSIS — I10 Essential (primary) hypertension: Secondary | ICD-10-CM

## 2023-02-23 DIAGNOSIS — I2510 Atherosclerotic heart disease of native coronary artery without angina pectoris: Secondary | ICD-10-CM

## 2023-03-16 ENCOUNTER — Ambulatory Visit
Admission: RE | Admit: 2023-03-16 | Discharge: 2023-03-16 | Disposition: A | Discharge status: Home / Self Care | Payer: Medicare Other | Source: Ambulatory Visit | Attending: Internal Medicine | Admitting: Internal Medicine

## 2023-03-16 DIAGNOSIS — Z1231 Encounter for screening mammogram for malignant neoplasm of breast: Secondary | ICD-10-CM

## 2023-03-17 ENCOUNTER — Other Ambulatory Visit: Payer: Self-pay | Admitting: Urology

## 2023-03-17 ENCOUNTER — Telehealth: Payer: Self-pay | Admitting: Cardiovascular Disease

## 2023-03-17 NOTE — Telephone Encounter (Signed)
   Name: Lydia Campbell  DOB: 06-Jul-1941  MRN: [id number]   Primary Cardiologist: Charlton Haws, MD  Chart reviewed as part of pre-operative protocol coverage. Lydia Campbell was last seen on 02/04/2023 by Dr. Eden Emms.  Spoke with patient via telephone today. She is doing well with no concerning cardiac complaints. She stays active walking and performing light to moderate household activities including yard work.   Therefore, based on ACC/AHA guidelines, the patient would be at acceptable risk for the planned procedure without further cardiovascular testing.    I will route this recommendation to the requesting party via Epic fax function and remove from pre-op pool. Please call with questions.  Carlos Levering, NP 03/17/2023, 4:45 PM

## 2023-03-17 NOTE — Telephone Encounter (Signed)
   Pre-operative Risk Assessment    Patient Name: Lydia Campbell  DOB: 02/06/1941 MRN: [id number]      Request for Surgical Clearance    Procedure:   Robotic Sacrcocoptopexy   Date of Surgery:  Clearance 03/26/23                                 Surgeon:  Dr. Vallery Ridge Group or Practice Name:  [facility]  Phone number:  [phone] Fax number:  [phone]   Type of Clearance Requested:   - Medical    Type of Anesthesia:  General    Additional requests/questions:      SignedEmilie Rutter   03/17/2023, 3:54 PM

## 2023-03-18 ENCOUNTER — Other Ambulatory Visit: Payer: Self-pay | Admitting: Internal Medicine

## 2023-03-18 DIAGNOSIS — R928 Other abnormal and inconclusive findings on diagnostic imaging of breast: Secondary | ICD-10-CM

## 2023-03-19 ENCOUNTER — Other Ambulatory Visit: Payer: Self-pay | Admitting: Urology

## 2023-03-23 NOTE — Patient Instructions (Addendum)
DUE TO COVID-19 ONLY TWO VISITORS  (aged 80 and older)  ARE ALLOWED TO COME WITH YOU AND STAY IN THE WAITING ROOM ONLY DURING PRE OP AND PROCEDURE.   **NO VISITORS ARE ALLOWED IN THE SHORT STAY AREA OR RECOVERY ROOM!!**  IF YOU WILL BE ADMITTED INTO THE HOSPITAL YOU ARE ALLOWED ONLY FOUR SUPPORT PEOPLE DURING VISITATION HOURS ONLY (7 AM -8PM)   The support person(s) must pass our screening, gel in and out, and wear a mask at all times, including in the patient's room. Patients must also wear a mask when staff or their support person are in the room. Visitors GUEST BADGE MUST BE WORN VISIBLY  One adult visitor may remain with you overnight and MUST be in the room by 8 P.M.     Your procedure is scheduled on: 03/26/23   Report to [facility] Main Entrance    Report to admitting at : 5:15 AM   Call this number if you have problems the morning of surgery [phone]   Clear liquids starting the day before surgery,until: 4:30 AM the day of surgery.Drink plenty clear liquids the day before surgery.     CLEAR LIQUID DIET   Foods Allowed                                                                     Foods Excluded  Coffee and tea, regular and decaf                             liquids that you cannot  Plain Jell-O any favor except red or purple                                           see through such as: Fruit ices (not with fruit pulp)                                     milk, soups, orange juice  Iced Popsicles                                    All solid food Carbonated beverages, regular and diet                                    Cranberry, grape and apple juices Sports drinks like Gatorade Lightly seasoned clear broth or consume(fat free) Sugar  Sample Menu Breakfast                                Lunch                                     Supper Cranberry juice  Beef broth                            Chicken broth Jell-O                                      Grape juice                           Apple juice Coffee or tea                        Jell-O                                      Popsicle                                                Coffee or tea                        Coffee or tea  _____________________________________________________________________  FOLLOW ANY ADDITIONAL PRE OP INSTRUCTIONS YOU RECEIVED FROM YOUR SURGEON'S OFFICE!!!   Oral Hygiene is also important to reduce your risk of infection.                                    Remember - BRUSH YOUR TEETH THE MORNING OF SURGERY WITH YOUR REGULAR TOOTHPASTE  DENTURES WILL BE REMOVED PRIOR TO SURGERY PLEASE DO NOT APPLY "Poly grip" OR ADHESIVES!!!   Do NOT smoke after Midnight   Take these medicines the morning of surgery with A SIP OF WATER: gabapentin,montelukast,levocetirizine,allopurinol,pantoprazole.Use inhalers,eye drops and nasal spray as usual.  Bring CPAP mask and tubing day of surgery.                              You may not have any metal on your body including hair pins, jewelry, and body piercing             Do not wear make-up, lotions, powders, perfumes/cologne, or deodorant  Do not wear nail polish including gel and S&S, artificial/acrylic nails, or any other type of covering on natural nails including finger and toenails. If you have artificial nails, gel coating, etc. that needs to be removed by a nail salon please have this removed prior to surgery or surgery may need to be canceled/ delayed if the surgeon/ anesthesia feels like they are unable to be safely monitored.   Do not shave  48 hours prior to surgery.    Do not bring valuables to the hospital.  IS NOT             RESPONSIBLE   FOR VALUABLES.   Contacts, glasses, or bridgework may not be worn into surgery.   Bring small overnight bag day of surgery.   DO NOT BRING YOUR HOME MEDICATIONS TO THE HOSPITAL. PHARMACY WILL DISPENSE MEDICATIONS LISTED ON YOUR MEDICATION LIST TO  YOU DURING YOUR ADMISSION IN THE HOSPITAL!  Patients discharged on the day of surgery will not be allowed to drive home.  Someone NEEDS to stay with you for the first 24 hours after anesthesia.   Special Instructions: Bring a copy of your healthcare power of attorney and living will documents         the day of surgery if you haven't scanned them before.              Please read over the following fact sheets you were given: IF YOU HAVE QUESTIONS ABOUT YOUR PRE-OP INSTRUCTIONS PLEASE CALL [phone]    [facility] - Preparing for Surgery Before surgery, you can play an important role.  Because skin is not sterile, your skin needs to be as free of germs as possible.  You can reduce the number of germs on your skin by washing with CHG (chlorahexidine gluconate) soap before surgery.  CHG is an antiseptic cleaner which kills germs and bonds with the skin to continue killing germs even after washing. Please DO NOT use if you have an allergy to CHG or antibacterial soaps.  If your skin becomes reddened/irritated stop using the CHG and inform your nurse when you arrive at Short Stay. Do not shave (including legs and underarms) for at least 48 hours prior to the first CHG shower.  You may shave your face/neck. Please follow these instructions carefully:  1.  Shower with CHG Soap the night before surgery and the  morning of Surgery.  2.  If you choose to wash your hair, wash your hair first as usual with your  normal  shampoo.  3.  After you shampoo, rinse your hair and body thoroughly to remove the  shampoo.                           4.  Use CHG as you would any other liquid soap.  You can apply chg directly  to the skin and wash                       Gently with a scrungie or clean washcloth.  5.  Apply the CHG Soap to your body ONLY FROM THE NECK DOWN.   Do not use on face/ open                           Wound or open sores. Avoid contact with eyes, ears mouth and genitals (private parts).                        Wash face,  Genitals (private parts) with your normal soap.             6.  Wash thoroughly, paying special attention to the area where your surgery  will be performed.  7.  Thoroughly rinse your body with warm water from the neck down.  8.  DO NOT shower/wash with your normal soap after using and rinsing off  the CHG Soap.                9.  Pat yourself dry with a clean towel.            10.  Wear clean pajamas.            11.  Place clean sheets on your bed the night of your first shower and do not  sleep with pets. Day  of Surgery : Do not apply any lotions/deodorants the morning of surgery.  Please wear clean clothes to the hospital/surgery center.  FAILURE TO FOLLOW THESE INSTRUCTIONS MAY RESULT IN THE CANCELLATION OF YOUR SURGERY PATIENT SIGNATURE_________________________________  NURSE SIGNATURE__________________________________  ________________________________________________________________________ WHAT IS A BLOOD TRANSFUSION? Blood Transfusion Information  A transfusion is the replacement of blood or some of its parts. Blood is made up of multiple cells which provide different functions. Red blood cells carry oxygen and are used for blood loss replacement. White blood cells fight against infection. Platelets control bleeding. Plasma helps clot blood. Other blood products are available for specialized needs, such as hemophilia or other clotting disorders. BEFORE THE TRANSFUSION  Who gives blood for transfusions?  Healthy volunteers who are fully evaluated to make sure their blood is safe. This is blood bank blood. Transfusion therapy is the safest it has ever been in the practice of medicine. Before blood is taken from a donor, a complete history is taken to make sure that person has no history of diseases nor engages in risky social behavior (examples are intravenous drug use or sexual activity with multiple partners). The donor's travel history is screened to  minimize risk of transmitting infections, such as malaria. The donated blood is tested for signs of infectious diseases, such as HIV and hepatitis. The blood is then tested to be sure it is compatible with you in order to minimize the chance of a transfusion reaction. If you or a relative donates blood, this is often done in anticipation of surgery and is not appropriate for emergency situations. It takes many days to process the donated blood. RISKS AND COMPLICATIONS Although transfusion therapy is very safe and saves many lives, the main dangers of transfusion include:  Getting an infectious disease. Developing a transfusion reaction. This is an allergic reaction to something in the blood you were given. Every precaution is taken to prevent this. The decision to have a blood transfusion has been considered carefully by your caregiver before blood is given. Blood is not given unless the benefits outweigh the risks. AFTER THE TRANSFUSION Right after receiving a blood transfusion, you will usually feel much better and more energetic. This is especially true if your red blood cells have gotten low (anemic). The transfusion raises the level of the red blood cells which carry oxygen, and this usually causes an energy increase. The nurse administering the transfusion will monitor you carefully for complications. HOME CARE INSTRUCTIONS  No special instructions are needed after a transfusion. You may find your energy is better. Speak with your caregiver about any limitations on activity for underlying diseases you may have. SEEK MEDICAL CARE IF:  Your condition is not improving after your transfusion. You develop redness or irritation at the intravenous (IV) site. SEEK IMMEDIATE MEDICAL CARE IF:  Any of the following symptoms occur over the next 12 hours: Shaking chills. You have a temperature by mouth above 102 F (38.9 C), not controlled by medicine. Chest, back, or muscle pain. People around you feel  you are not acting correctly or are confused. Shortness of breath or difficulty breathing. Dizziness and fainting. You get a rash or develop hives. You have a decrease in urine output. Your urine turns a dark color or changes to pink, red, or brown. Any of the following symptoms occur over the next 10 days: You have a temperature by mouth above 102 F (38.9 C), not controlled by medicine. Shortness of breath. Weakness after normal activity. The white part of  the eye turns yellow (jaundice). You have a decrease in the amount of urine or are urinating less often. Your urine turns a dark color or changes to pink, red, or brown. Document Released: 07/03/2000 Document Revised: 09/28/2011 Document Reviewed: 02/20/2008 [facility] Patient Information 2014 [place], [place].  _______________________________________________________________________

## 2023-03-24 ENCOUNTER — Other Ambulatory Visit: Payer: Self-pay

## 2023-03-24 ENCOUNTER — Encounter (HOSPITAL_COMMUNITY): Payer: Self-pay

## 2023-03-24 ENCOUNTER — Encounter (HOSPITAL_COMMUNITY)
Admission: RE | Admit: 2023-03-24 | Discharge: 2023-03-24 | Disposition: A | Discharge status: Home / Self Care | Payer: Medicare Other | Source: Ambulatory Visit | Attending: Urology | Admitting: Urology

## 2023-03-24 VITALS — BP 126/69 | HR 62 | Temp 97.7°F | Ht 67.0 in | Wt 197.0 lb

## 2023-03-24 DIAGNOSIS — R8290 Unspecified abnormal findings in urine: Secondary | ICD-10-CM | POA: Diagnosis not present

## 2023-03-24 DIAGNOSIS — K219 Gastro-esophageal reflux disease without esophagitis: Secondary | ICD-10-CM | POA: Insufficient documentation

## 2023-03-24 DIAGNOSIS — R011 Cardiac murmur, unspecified: Secondary | ICD-10-CM | POA: Insufficient documentation

## 2023-03-24 DIAGNOSIS — N8189 Other female genital prolapse: Secondary | ICD-10-CM | POA: Insufficient documentation

## 2023-03-24 DIAGNOSIS — Z86711 Personal history of pulmonary embolism: Secondary | ICD-10-CM | POA: Insufficient documentation

## 2023-03-24 DIAGNOSIS — Z853 Personal history of malignant neoplasm of breast: Secondary | ICD-10-CM | POA: Diagnosis not present

## 2023-03-24 DIAGNOSIS — I2510 Atherosclerotic heart disease of native coronary artery without angina pectoris: Secondary | ICD-10-CM | POA: Insufficient documentation

## 2023-03-24 DIAGNOSIS — G4733 Obstructive sleep apnea (adult) (pediatric): Secondary | ICD-10-CM | POA: Insufficient documentation

## 2023-03-24 DIAGNOSIS — I10 Essential (primary) hypertension: Secondary | ICD-10-CM | POA: Diagnosis not present

## 2023-03-24 DIAGNOSIS — Z01818 Encounter for other preprocedural examination: Secondary | ICD-10-CM | POA: Insufficient documentation

## 2023-03-24 DIAGNOSIS — J45909 Unspecified asthma, uncomplicated: Secondary | ICD-10-CM | POA: Diagnosis not present

## 2023-03-24 HISTORY — DX: Dyspnea, unspecified: R06.00

## 2023-03-24 LAB — BASIC METABOLIC PANEL
Anion gap: 8 (ref 5–15)
BUN: 26 mg/dL — ABNORMAL HIGH (ref 8–23)
CO2: 24 mmol/L (ref 22–32)
Calcium: 9.4 mg/dL (ref 8.9–10.3)
Chloride: 105 mmol/L (ref 98–111)
Creatinine, Ser: 0.87 mg/dL (ref 0.44–1.00)
GFR, Estimated: >60 mL/min (ref >60)
Glucose, Bld: 97 mg/dL (ref 70–99)
Potassium: 4.3 mmol/L (ref 3.5–5.1)
Sodium: 137 mmol/L (ref 135–145)

## 2023-03-24 LAB — CBC
HCT: 43 % (ref 36.0–46.0)
Hemoglobin: 14.2 g/dL (ref 12.0–15.0)
MCH: 32.5 pg (ref 26.0–34.0)
MCHC: 33 g/dL (ref 30.0–36.0)
MCV: 98.4 fL (ref 80.0–100.0)
Platelets: 199 10*3/uL (ref 150–400)
RBC: 4.37 MIL/uL (ref 3.87–5.11)
RDW: 13.2 % (ref 11.5–15.5)
WBC: 8.4 10*3/uL (ref 4.0–10.5)
nRBC: 0 % (ref 0.0–0.2)

## 2023-03-24 NOTE — Progress Notes (Signed)
Case: [id number] Date/Time: 03/26/23 0715   Procedure: XI ROBOTIC ASSISTED LAPAROSCOPIC SACROCOLPOPEXY   Anesthesia type: General   Pre-op diagnosis: PELVIC ORGAN PROLAPSE   Location[facility] ROOM 03 / [facility]   Surgeons: Crist Fat, MD       DISCUSSION: Lydia Campbell is an 82 year old female who presents to PAT prior to surgery above.  Past medical history significant for former smoking, mild nonobstructive CAD on cardiac cath 10/2014, GERD, asthma, moderate OSA (uses CPAP), heart murmur, history of left-sided breast cancer s/p lumpectomy and chemo (in 2009), hx of PE (in 2012), anxiety  Complication from anesthesia includes prolonged emergence  Patient follows with cardiology for nonobstructive CAD.  Patient has remote history of chest pain that prompted an ischemic evaluation in 2016.  Workup was reassuring.  She also has history of a PE years ago which was thought to be provoked in the setting of tamoxifen use. She is no longer on any antiplatelet coagulation. She has chronic mild lower extremity edema noted on exam and mild DOE which is stable. Dr. Eden Emms ordered an echo since she hasn't had one since 2016 and subsequently she was cleared for surgery (telephone note 8/28):  "Chart reviewed as part of pre-operative protocol coverage. Lydia Campbell was last seen on 02/04/2023 by Dr. Eden Emms.  Spoke with patient via telephone today. She is doing well with no concerning cardiac complaints. She stays active walking and performing light to moderate household activities including yard work.  Therefore, based on ACC/AHA guidelines, the patient would be at acceptable risk for the planned procedure without further cardiovascular testing."  Discussed with Dr. Hyacinth Meeker due to outstanding echo. He states ok to proceed as patient is stable and she has been cleared.  VS: BP 126/69   Pulse 62   Temp 36.5 C (Oral)   Ht 5\' 7"  (1.702 m)   Wt 89.4 kg   LMP  (LMP Unknown)   SpO2 100%   BMI 30.85  kg/m   PROVIDERS: Theodoro Kos, MD Cardiology: Charlton Haws, MD  LABS: Labs reviewed: Acceptable for surgery. (all labs ordered are listed, but only abnormal results are displayed)  Labs Reviewed  BASIC METABOLIC PANEL - Abnormal; Notable for the following components:      Result Value   BUN 26 (*)    All other components within normal limits  URINE CULTURE  CBC  TYPE AND SCREEN     IMAGES:    EKG 03/24/23  NSR, rate 64 LAD Non specific T wave abnormality Artifact  CV:  NM stress test 11/11/2016:    IMPRESSION: 1. No evidence of reversible ischemia. There is a moderate fixed defect in the basilar and mid ventricular septal wall.   2. Normal left ventricular wall motion.   3. Left ventricular ejection fraction 63%   4. Non invasive risk stratification*: Low  LHC 10/26/2014:  Impression: 1. Mild non-obstructive CAD 2. Normal LV systolic function 3. Non-cardiac chest pain  Echo 10/22/2014:  Study Conclusions   - Left ventricle: The cavity size was normal. Systolic function was    normal. The estimated ejection fraction was in the range of 55%    to 60%. Wall motion was normal; there were no regional wall    motion abnormalities.  - Mitral valve: There was trivial regurgitation.  - Pulmonic valve: There was trivial regurgitation.   Past Medical History:  Diagnosis Date   Anxiety    Asthma    Blood transfusion 07/2008   "related to  chemo" (07/25/2012)   Breast cancer (HCC)    breast lft   Chronic edema    Chronic lower back pain    Clotting disorder (HCC)    "sudden chest pain; found multiple blood clots in both lungs; they think it was from Tamoxifen" (07/25/2012)   Complication of anesthesia    Hard to wake up   DDD (degenerative disc disease), lumbosacral    Dyspnea    Fibromyalgia    GERD (gastroesophageal reflux disease)    Gout    H/O hiatal hernia    Heart murmur    Hx of blood clots 02/2011   "sudden chest pain; found multiple blood  clots in both lungs; they think it was from Tamoxifen; haven't had any since 11/2011" (07/25/2012)   Hypertension    pt denies   Left knee DJD    Lymph edema    Left arm after mastectomy   Mild CAD    pt denies   OSA on CPAP    Moderate   Osteoarthritis    "pretty much everywhere" (07/25/2012)   Pulmonary embolism (HCC) 02/2011   after chemo   Right knee DJD    Seasonal allergies    Seizures (HCC)    "seizures vs convulsions related to PCN when I was a child" (07/25/2012)   Subcutaneous hematoma 07/25/2012   Intraoperative cultures are pending on this fluid collection that was just outside the left knee joint.  Gram stain had few WBC monos and polys.   Varicose veins of both legs with edema     Past Surgical History:  Procedure Laterality Date   BREAST BIOPSY Left 03/05/2008   malignant   BREAST LUMPECTOMY  02/2008   "left" (07/25/2012)   BREAST SURGERY     CATARACT EXTRACTION, BILATERAL     KNEE ARTHROSCOPY  2001;  2009   "right; left" (07/25/2012)   LEFT HEART CATHETERIZATION WITH CORONARY ANGIOGRAM N/A 10/26/2014   Procedure: LEFT HEART CATHETERIZATION WITH CORONARY ANGIOGRAM;  Surgeon: Kathleene Hazel, MD;  Location: [facility] CATH LAB;  Service: Cardiovascular;  Laterality: N/A;   MASTECTOMY MODIFIED RADICAL / SIMPLE / COMPLETE Left 03/28/2008   "left" (07/25/2012)   PORTACATH PLACEMENT  03/28/2008; ?05/2008   "left; right" (07/25/2012)   REVERSE SHOULDER ARTHROPLASTY Right 11/22/2019   Procedure: REVERSE SHOULDER ARTHROPLASTY;  Surgeon: Bjorn Pippin, MD;  Location[facility];  Service: Orthopedics;  Laterality: Right;   TONSILLECTOMY  1950   TOTAL KNEE ARTHROPLASTY  12/21/2011   Procedure: TOTAL KNEE ARTHROPLASTY;  Surgeon: Nilda Simmer, MD;  Location: [facility] OR;  Service: Orthopedics;  Laterality: Right;  DR Thurston Hole WANTS 90 MINUTES FOR THIS CASE   TOTAL KNEE ARTHROPLASTY WITH REVISION COMPONENTS  07/25/2012   Procedure: TOTAL KNEE ARTHROPLASTY WITH REVISION COMPONENTS;  Surgeon:  Nilda Simmer, MD;  Location: [facility] OR;  Service: Orthopedics;  Laterality: Left;   TUBAL LIGATION  ~ 1976   VAGINAL HYSTERECTOMY  ~ 1983   "endometrosis" (07/25/2012)    MEDICATIONS:  acyclovir (ZOVIRAX) 400 MG tablet   albuterol (VENTOLIN HFA) 108 (90 Base) MCG/ACT inhaler   allopurinol (ZYLOPRIM) 300 MG tablet   ascorbic acid (VITAMIN C) 500 MG tablet   Biotin 5000 MCG CAPS   Cholecalciferol 125 MCG (5000 UT) TABS   Cranberry 500 MG TABS   dicyclomine (BENTYL) 10 MG capsule   fluticasone (FLONASE) 50 MCG/ACT nasal spray   furosemide (LASIX) 20 MG tablet   gabapentin (NEURONTIN) 100 MG capsule   guaiFENesin (MUCINEX) 600  MG 12 hr tablet   ibuprofen (ADVIL) 200 MG tablet   levocetirizine (XYZAL ALLERGY 24HR) 5 MG tablet   montelukast (SINGULAIR) 10 MG tablet   nabumetone (RELAFEN) 500 MG tablet   pantoprazole (PROTONIX) 20 MG tablet   Polyethyl Glycol-Propyl Glycol (SYSTANE ULTRA) 0.4-0.3 % SOLN   potassium chloride SA (KLOR-CON M) 20 MEQ tablet   traZODone (DESYREL) 50 MG tablet   triamterene-hydrochlorothiazide (MAXZIDE) 75-50 MG tablet   UNABLE TO FIND   No current facility-administered medications for this encounter.   Marcille Blanco [facility] Short Stay/Anesthesiology [facility] Phone [phone] 03/24/2023 1:47 PM

## 2023-03-24 NOTE — Progress Notes (Addendum)
For Short Stay: COVID SWAB appointment date:  Bowel Prep reminder: Reviewed.   For Anesthesia: PCP - Theodoro Kos, MD  Cardiologist - Wendall Stade, MD  Clearance: Carlos Levering: NP: 03/17/23 Chest x-ray -  EKG - 03/24/23 Stress Test -  ECHO - 10/22/14 Cardiac Cath - 2016 Pacemaker/ICD device last checked: Pacemaker orders received: Device Rep notified:  Spinal Cord Stimulator: N/A  Sleep Study - Yes CPAP - NO  Fasting Blood Sugar - N/A Checks Blood Sugar _____ times a day Date and result of last Hgb A1c-  Last dose of GLP1 agonist- N/A GLP1 instructions:   Last dose of SGLT-2 inhibitors- N/A SGLT-2 instructions:   Blood Thinner Instructions: N/A Aspirin Instructions: Last Dose:  Activity level:    Unable to go up a flight of stairs without shortness of breath   Anesthesia review: Hx: PE,HTN,Heart murmur,CAD,OSA(NO CPAP),Seizure as a child.  Patient denies shortness of breath, fever, cough and chest pain at PAT appointment   Patient verbalized understanding of instructions that were given to them at the PAT appointment. Patient was also instructed that they will need to review over the PAT instructions again at home before surgery.

## 2023-03-24 NOTE — Anesthesia Preprocedure Evaluation (Addendum)
Anesthesia Evaluation  Patient identified by MRN, date of birth, ID band Patient awake    Reviewed: Allergy & Precautions, NPO status , Patient's Chart, lab work & pertinent test results  Airway Mallampati: II  TM Distance: >3 FB Neck ROM: Full    Dental no notable dental hx. (+) Partial Lower, Upper Dentures   Pulmonary asthma (on ventolin PRN) , former smoker, PE (2012 S/P chemo)   Pulmonary exam normal breath sounds clear to auscultation       Cardiovascular hypertension, + CAD  Normal cardiovascular exam Rhythm:Regular Rate:Normal  NM stress test 11/11/2016:    IMPRESSION: 1. No evidence of reversible ischemia. There is a moderate fixed defect in the basilar and mid ventricular septal wall.   2. Normal left ventricular wall motion.   3. Left ventricular ejection fraction 63%   4. Non invasive risk stratification*: Low    Neuro/Psych   Anxiety        GI/Hepatic ,GERD  Medicated and Controlled,,  Endo/Other    Renal/GU Lab Results      Component                Value               Date                      NA                       137                 03/24/2023                CL                       105                 03/24/2023                K                        4.3                 03/24/2023                CO2                      24                  03/24/2023                BUN                      26 (H)              03/24/2023                CREATININE               0.87                03/24/2023                GFRNONAA                 >60                 03/24/2023  CALCIUM                  9.4                 03/24/2023                      GLUCOSE                  97                  03/24/2023              L Breast CA 2009    Musculoskeletal  (+) Arthritis , Osteoarthritis,  Fibromyalgia -  Abdominal   Peds  Hematology Lab Results      Component                Value                Date                      WBC                      8.4                 03/24/2023                HGB                      14.2                03/24/2023                HCT                      43.0                03/24/2023                MCV                      98.4                03/24/2023                PLT                      199                 03/24/2023              Anesthesia Other Findings   Reproductive/Obstetrics                              Anesthesia Physical Anesthesia Plan  ASA: 3  Anesthesia Plan: General   Post-op Pain Management: Tylenol PO (pre-op)*, Lidocaine infusion* and Ketamine IV*   Induction: Intravenous  PONV Risk Score and Plan: Treatment may vary due to age or medical condition, Ondansetron and Dexamethasone  Airway Management Planned: Oral ETT  Additional Equipment: None  Intra-op Plan:   Post-operative Plan: Extubation in OR  Informed Consent: I have reviewed the patients History and Physical, chart, labs and discussed the procedure including the risks, benefits and alternatives for the proposed anesthesia with the patient or authorized representative who has indicated his/her understanding  and acceptance.     Dental advisory given  Plan Discussed with: CRNA and Anesthesiologist  Anesthesia Plan Comments: (See PAT note from 9/4 by Sherlie Ban PA-C )         Anesthesia Quick Evaluation

## 2023-03-25 NOTE — Progress Notes (Signed)
Lab. Results: Urine Culture >=100,000 COLONIES/mL ESCHERICHIA COLI Abnormal

## 2023-03-26 ENCOUNTER — Encounter (HOSPITAL_COMMUNITY)
Admission: RE | Disposition: A | Discharge status: Home / Self Care | Payer: Self-pay | Source: Home / Self Care | Attending: Urology

## 2023-03-26 ENCOUNTER — Ambulatory Visit (HOSPITAL_COMMUNITY): Payer: Medicare Other | Admitting: Medical

## 2023-03-26 ENCOUNTER — Other Ambulatory Visit: Payer: Self-pay

## 2023-03-26 ENCOUNTER — Encounter (HOSPITAL_COMMUNITY): Payer: Self-pay | Admitting: Urology

## 2023-03-26 ENCOUNTER — Ambulatory Visit (HOSPITAL_BASED_OUTPATIENT_CLINIC_OR_DEPARTMENT_OTHER): Payer: Medicare Other | Admitting: Anesthesiology

## 2023-03-26 ENCOUNTER — Observation Stay (HOSPITAL_COMMUNITY)
Admission: RE | Admit: 2023-03-26 | Discharge: 2023-03-27 | Disposition: A | Discharge status: Home / Self Care | Payer: Medicare Other | Attending: Urology | Admitting: Urology

## 2023-03-26 DIAGNOSIS — N3946 Mixed incontinence: Secondary | ICD-10-CM | POA: Diagnosis not present

## 2023-03-26 DIAGNOSIS — N8111 Cystocele, midline: Secondary | ICD-10-CM | POA: Insufficient documentation

## 2023-03-26 DIAGNOSIS — I10 Essential (primary) hypertension: Secondary | ICD-10-CM | POA: Insufficient documentation

## 2023-03-26 DIAGNOSIS — Z79899 Other long term (current) drug therapy: Secondary | ICD-10-CM | POA: Insufficient documentation

## 2023-03-26 DIAGNOSIS — Z87891 Personal history of nicotine dependence: Secondary | ICD-10-CM

## 2023-03-26 DIAGNOSIS — I2510 Atherosclerotic heart disease of native coronary artery without angina pectoris: Secondary | ICD-10-CM | POA: Diagnosis not present

## 2023-03-26 DIAGNOSIS — N819 Female genital prolapse, unspecified: Secondary | ICD-10-CM | POA: Diagnosis not present

## 2023-03-26 DIAGNOSIS — N8189 Other female genital prolapse: Secondary | ICD-10-CM | POA: Diagnosis present

## 2023-03-26 DIAGNOSIS — Z853 Personal history of malignant neoplasm of breast: Secondary | ICD-10-CM | POA: Diagnosis not present

## 2023-03-26 HISTORY — PX: ROBOTIC ASSISTED LAPAROSCOPIC SACROCOLPOPEXY: SHX5388

## 2023-03-26 LAB — TYPE AND SCREEN
ABO/RH(D): A NEG
Antibody Screen: NEGATIVE

## 2023-03-26 LAB — URINE CULTURE: Culture: >=100000 — AB

## 2023-03-26 SURGERY — SACROCOLPOPEXY, ROBOT-ASSISTED, LAPAROSCOPIC
Anesthesia: General

## 2023-03-26 MED ORDER — CHLORHEXIDINE GLUCONATE 0.12 % MT SOLN
15.0000 mL | Freq: Once | OROMUCOSAL | Status: AC
  Administered 2023-03-26: 15 mL via OROMUCOSAL

## 2023-03-26 MED ORDER — LACTATED RINGERS IV SOLN: INTRAVENOUS | Status: DC

## 2023-03-26 MED ORDER — POTASSIUM CHLORIDE CRYS ER 20 MEQ PO TBCR
20.0000 meq | EXTENDED_RELEASE_TABLET | Freq: Every day | ORAL | Status: DC
  Administered 2023-03-26: 20 meq via ORAL
  Filled 2023-03-26: qty 1

## 2023-03-26 MED ORDER — LACTATED RINGERS IR SOLN
Status: DC | PRN
  Administered 2023-03-26: 1000 mL

## 2023-03-26 MED ORDER — MONTELUKAST SODIUM 10 MG PO TABS: 10.0000 mg | ORAL_TABLET | Freq: Every day | ORAL | Status: DC

## 2023-03-26 MED ORDER — SODIUM CHLORIDE 0.9 % IV SOLN
INTRAVENOUS | Status: DC | PRN
Start: 2023-03-26 — End: 2023-03-26

## 2023-03-26 MED ORDER — DEXAMETHASONE SODIUM PHOSPHATE 10 MG/ML IJ SOLN
INTRAMUSCULAR | Status: AC
  Filled 2023-03-26: qty 1

## 2023-03-26 MED ORDER — TRAZODONE HCL 50 MG PO TABS
50.0000 mg | ORAL_TABLET | Freq: Every day | ORAL | Status: DC
  Administered 2023-03-26: 50 mg via ORAL
  Filled 2023-03-26: qty 1

## 2023-03-26 MED ORDER — LORATADINE 10 MG PO TABS: 10.0000 mg | ORAL_TABLET | Freq: Every day | ORAL | Status: DC

## 2023-03-26 MED ORDER — ACETAMINOPHEN 10 MG/ML IV SOLN
1000.0000 mg | Freq: Four times a day (QID) | INTRAVENOUS | Status: DC
  Administered 2023-03-26 – 2023-03-27 (×3): 1000 mg via INTRAVENOUS
  Filled 2023-03-26 (×3): qty 100

## 2023-03-26 MED ORDER — LIDOCAINE HCL (PF) 2 % IJ SOLN
INTRAMUSCULAR | Status: DC | PRN
Start: 2023-03-26 — End: 2023-03-26
  Administered 2023-03-26: .805 mg/kg/h via INTRADERMAL

## 2023-03-26 MED ORDER — BUPIVACAINE LIPOSOME 1.3 % IJ SUSP
INTRAMUSCULAR | Status: AC
  Filled 2023-03-26: qty 20

## 2023-03-26 MED ORDER — TRAMADOL HCL 50 MG PO TABS: 50.0000 mg | ORAL_TABLET | Freq: Four times a day (QID) | ORAL | 0 refills | Status: AC | PRN

## 2023-03-26 MED ORDER — PROPOFOL 10 MG/ML IV BOLUS
INTRAVENOUS | Status: DC | PRN
  Administered 2023-03-26: 100 mg via INTRAVENOUS

## 2023-03-26 MED ORDER — KETAMINE HCL 50 MG/5ML IJ SOSY
PREFILLED_SYRINGE | INTRAMUSCULAR | Status: AC
  Filled 2023-03-26: qty 5

## 2023-03-26 MED ORDER — SUGAMMADEX SODIUM 200 MG/2ML IV SOLN
INTRAVENOUS | Status: DC | PRN
Start: 2023-03-26 — End: 2023-03-26
  Administered 2023-03-26: 400 mg via INTRAVENOUS

## 2023-03-26 MED ORDER — GLYCOPYRROLATE 0.2 MG/ML IJ SOLN
INTRAMUSCULAR | Status: AC
  Filled 2023-03-26: qty 1

## 2023-03-26 MED ORDER — FUROSEMIDE 20 MG PO TABS: 20.0000 mg | ORAL_TABLET | ORAL | Status: DC | PRN

## 2023-03-26 MED ORDER — BUPIVACAINE-EPINEPHRINE (PF) 0.5% -1:200000 IJ SOLN
INTRAMUSCULAR | Status: AC
  Filled 2023-03-26: qty 30

## 2023-03-26 MED ORDER — PROPOFOL 1000 MG/100ML IV EMUL
INTRAVENOUS | Status: AC
  Filled 2023-03-26: qty 100

## 2023-03-26 MED ORDER — PANTOPRAZOLE SODIUM 20 MG PO TBEC
20.0000 mg | DELAYED_RELEASE_TABLET | Freq: Two times a day (BID) | ORAL | Status: DC
  Administered 2023-03-26: 20 mg via ORAL
  Filled 2023-03-26 (×2): qty 1

## 2023-03-26 MED ORDER — KETAMINE HCL 10 MG/ML IJ SOLN
INTRAMUSCULAR | Status: DC | PRN
  Administered 2023-03-26 (×3): 10 mg via INTRAVENOUS

## 2023-03-26 MED ORDER — DOCUSATE SODIUM 100 MG PO CAPS: 100.0000 mg | ORAL_CAPSULE | Freq: Two times a day (BID) | ORAL | Status: DC

## 2023-03-26 MED ORDER — FENTANYL CITRATE PF 50 MCG/ML IJ SOSY: 25.0000 ug | PREFILLED_SYRINGE | INTRAMUSCULAR | Status: DC | PRN

## 2023-03-26 MED ORDER — BUPIVACAINE-EPINEPHRINE 0.5% -1:200000 IJ SOLN
INTRAMUSCULAR | Status: DC | PRN
  Administered 2023-03-26: 30 mL

## 2023-03-26 MED ORDER — CLINDAMYCIN PHOSPHATE 2 % VA CREA
TOPICAL_CREAM | VAGINAL | Status: DC | PRN
  Administered 2023-03-26: 1 via VAGINAL

## 2023-03-26 MED ORDER — GLYCOPYRROLATE 0.2 MG/ML IJ SOLN
INTRAMUSCULAR | Status: DC | PRN
  Administered 2023-03-26: .1 mg via INTRAVENOUS

## 2023-03-26 MED ORDER — DEXAMETHASONE SODIUM PHOSPHATE 4 MG/ML IJ SOLN
INTRAMUSCULAR | Status: DC | PRN
  Administered 2023-03-26: 4 mg via INTRAVENOUS

## 2023-03-26 MED ORDER — KETOROLAC TROMETHAMINE 15 MG/ML IJ SOLN
15.0000 mg | Freq: Four times a day (QID) | INTRAMUSCULAR | Status: DC
  Administered 2023-03-26 – 2023-03-27 (×3): 15 mg via INTRAVENOUS
  Filled 2023-03-26 (×3): qty 1

## 2023-03-26 MED ORDER — SULFAMETHOXAZOLE-TRIMETHOPRIM 800-160 MG PO TABS
1.0000 | ORAL_TABLET | Freq: Two times a day (BID) | ORAL | 0 refills | Status: AC
Start: 2023-03-26 — End: 2023-04-02

## 2023-03-26 MED ORDER — SULFAMETHOXAZOLE-TRIMETHOPRIM 800-160 MG PO TABS
1.0000 | ORAL_TABLET | Freq: Two times a day (BID) | ORAL | Status: DC
  Administered 2023-03-26: 1 via ORAL
  Filled 2023-03-26: qty 1

## 2023-03-26 MED ORDER — TRAMADOL HCL 50 MG PO TABS: 50.0000 mg | ORAL_TABLET | Freq: Four times a day (QID) | ORAL | Status: DC | PRN

## 2023-03-26 MED ORDER — FENTANYL CITRATE (PF) 250 MCG/5ML IJ SOLN
INTRAMUSCULAR | Status: AC
  Filled 2023-03-26: qty 5

## 2023-03-26 MED ORDER — HYDRALAZINE HCL 20 MG/ML IJ SOLN: 5.0000 mg | INTRAMUSCULAR | Status: DC | PRN

## 2023-03-26 MED ORDER — POLYETHYLENE GLYCOL 3350 17 GM/SCOOP PO POWD: 1.0000 | Freq: Once | ORAL | Status: DC

## 2023-03-26 MED ORDER — TRIAMTERENE-HCTZ 37.5-25 MG PO TABS
2.0000 | ORAL_TABLET | Freq: Two times a day (BID) | ORAL | Status: DC
  Administered 2023-03-26: 2 via ORAL
  Filled 2023-03-26 (×2): qty 2

## 2023-03-26 MED ORDER — CLINDAMYCIN PHOSPHATE 900 MG/50ML IV SOLN
900.0000 mg | Freq: Once | INTRAVENOUS | Status: AC
  Administered 2023-03-26: 900 mg via INTRAVENOUS
  Filled 2023-03-26: qty 50

## 2023-03-26 MED ORDER — STERILE WATER FOR IRRIGATION IR SOLN
Status: DC | PRN
Start: 2023-03-26 — End: 2023-03-26
  Administered 2023-03-26: 1000 mL

## 2023-03-26 MED ORDER — ROCURONIUM BROMIDE 10 MG/ML (PF) SYRINGE
PREFILLED_SYRINGE | INTRAVENOUS | Status: AC
  Filled 2023-03-26: qty 10

## 2023-03-26 MED ORDER — ONDANSETRON HCL 4 MG/2ML IJ SOLN
INTRAMUSCULAR | Status: DC | PRN
  Administered 2023-03-26: 4 mg via INTRAVENOUS

## 2023-03-26 MED ORDER — LIDOCAINE HCL (PF) 2 % IJ SOLN
INTRAMUSCULAR | Status: AC
  Filled 2023-03-26: qty 20

## 2023-03-26 MED ORDER — BUPIVACAINE LIPOSOME 1.3 % IJ SUSP
INTRAMUSCULAR | Status: DC | PRN
  Administered 2023-03-26: 20 mL

## 2023-03-26 MED ORDER — ORAL CARE MOUTH RINSE: 15.0000 mL | Freq: Once | OROMUCOSAL | Status: AC

## 2023-03-26 MED ORDER — ALBUTEROL SULFATE (2.5 MG/3ML) 0.083% IN NEBU: 2.5000 mg | INHALATION_SOLUTION | Freq: Every day | RESPIRATORY_TRACT | Status: DC | PRN

## 2023-03-26 MED ORDER — ROCURONIUM BROMIDE 100 MG/10ML IV SOLN
INTRAVENOUS | Status: DC | PRN
  Administered 2023-03-26: 20 mg via INTRAVENOUS
  Administered 2023-03-26: 15 mg via INTRAVENOUS
  Administered 2023-03-26: 60 mg via INTRAVENOUS
  Administered 2023-03-26: 20 mg via INTRAVENOUS

## 2023-03-26 MED ORDER — ONDANSETRON HCL 4 MG/2ML IJ SOLN
INTRAMUSCULAR | Status: AC
  Filled 2023-03-26: qty 2

## 2023-03-26 MED ORDER — CIPROFLOXACIN IN D5W 400 MG/200ML IV SOLN
400.0000 mg | INTRAVENOUS | Status: AC
  Administered 2023-03-26: 400 mg via INTRAVENOUS
  Filled 2023-03-26: qty 200

## 2023-03-26 MED ORDER — CLINDAMYCIN PHOSPHATE 2 % VA CREA
TOPICAL_CREAM | VAGINAL | Status: AC
  Filled 2023-03-26: qty 40

## 2023-03-26 MED ORDER — GABAPENTIN 100 MG PO CAPS
100.0000 mg | ORAL_CAPSULE | ORAL | Status: DC
  Administered 2023-03-26: 100 mg via ORAL
  Filled 2023-03-26: qty 1

## 2023-03-26 MED ORDER — FENTANYL CITRATE (PF) 100 MCG/2ML IJ SOLN
INTRAMUSCULAR | Status: DC | PRN
  Administered 2023-03-26: 50 ug via INTRAVENOUS
  Administered 2023-03-26: 25 ug via INTRAVENOUS
  Administered 2023-03-26 (×2): 50 ug via INTRAVENOUS

## 2023-03-26 MED ORDER — ACETAMINOPHEN 10 MG/ML IV SOLN: 1000.0000 mg | Freq: Once | INTRAVENOUS | Status: DC | PRN

## 2023-03-26 MED ORDER — HYDROMORPHONE HCL 1 MG/ML IJ SOLN: 0.5000 mg | INTRAMUSCULAR | Status: DC | PRN

## 2023-03-26 MED ORDER — ESTRADIOL 0.1 MG/GM VA CREA
TOPICAL_CREAM | VAGINAL | Status: AC
  Filled 2023-03-26: qty 42.5

## 2023-03-26 MED ORDER — ONDANSETRON HCL 4 MG/2ML IJ SOLN: 4.0000 mg | Freq: Once | INTRAMUSCULAR | Status: DC | PRN

## 2023-03-26 MED ORDER — GUAIFENESIN ER 600 MG PO TB12: 600.0000 mg | ORAL_TABLET | Freq: Every day | ORAL | Status: DC

## 2023-03-26 MED ORDER — GABAPENTIN 100 MG PO CAPS: 200.0000 mg | ORAL_CAPSULE | Freq: Every day | ORAL | Status: DC

## 2023-03-26 SURGICAL SUPPLY — 76 items
ADH SKN CLS APL DERMABOND .7 (GAUZE/BANDAGES/DRESSINGS) ×1
APL PRP STRL LF DISP 70% ISPRP (MISCELLANEOUS) ×1
BAG COUNTER SPONGE SURGICOUNT (BAG) IMPLANT
BAG DRN RND TRDRP ANRFLXCHMBR (UROLOGICAL SUPPLIES)
BAG SPNG CNTER NS LX DISP (BAG)
BAG URINE DRAIN 2000ML AR STRL (UROLOGICAL SUPPLIES) IMPLANT
BRIEF MESH DISP LRG (UNDERPADS AND DIAPERS) IMPLANT
CATH FOLEY 2WAY SLVR 5CC 16FR (CATHETERS) ×1 IMPLANT
CHLORAPREP W/TINT 26 (MISCELLANEOUS) ×1 IMPLANT
CLIP LIGATING HEM O LOK PURPLE (MISCELLANEOUS) ×1 IMPLANT
CLIP LIGATING HEMO LOK XL GOLD (MISCELLANEOUS) IMPLANT
CLIP LIGATING HEMO O LOK GREEN (MISCELLANEOUS) IMPLANT
CLIP SUT LAPRA TY ABSORB (SUTURE) IMPLANT
COVER BACK TABLE 60X90IN (DRAPES) ×1 IMPLANT
COVER SURGICAL LIGHT HANDLE (MISCELLANEOUS) ×1 IMPLANT
COVER TIP SHEARS 8 DVNC (MISCELLANEOUS) ×1 IMPLANT
DERMABOND ADVANCED .7 DNX12 (GAUZE/BANDAGES/DRESSINGS) ×1 IMPLANT
DRAIN CHANNEL RND F F (WOUND CARE) IMPLANT
DRAPE ARM DVNC X/XI (DISPOSABLE) ×4 IMPLANT
DRAPE COLUMN DVNC XI (DISPOSABLE) ×1 IMPLANT
DRAPE INCISE IOBAN 66X45 STRL (DRAPES) ×1 IMPLANT
DRAPE SHEET LG 3/4 BI-LAMINATE (DRAPES) ×2 IMPLANT
DRAPE SURG IRRIG POUCH 19X23 (DRAPES) ×1 IMPLANT
DRIVER NDL LRG 8 DVNC XI (INSTRUMENTS) ×2 IMPLANT
DRIVER NDLE LRG 8 DVNC XI (INSTRUMENTS) ×2
ELECT PENCIL ROCKER SW 15FT (MISCELLANEOUS) ×1 IMPLANT
ELECT REM PT RETURN 15FT ADLT (MISCELLANEOUS) ×1 IMPLANT
FORCEPS BPLR LNG DVNC XI (INSTRUMENTS) ×1 IMPLANT
FORCEPS PROGRASP DVNC XI (FORCEP) ×1 IMPLANT
GAUZE 4X4 16PLY ~~LOC~~+RFID DBL (SPONGE) IMPLANT
GLOVE BIO SURGEON STRL SZ 6.5 (GLOVE) ×1 IMPLANT
GLOVE SURG LX STRL 7.5 STRW (GLOVE) ×3 IMPLANT
GOWN STRL REUS W/ TWL XL LVL3 (GOWN DISPOSABLE) ×2 IMPLANT
GOWN STRL REUS W/TWL XL LVL3 (GOWN DISPOSABLE) ×2
GOWN STRL SURGICAL XL XLNG (GOWN DISPOSABLE) ×1 IMPLANT
HOLDER FOLEY CATH W/STRAP (MISCELLANEOUS) ×1 IMPLANT
IRRIG SUCT STRYKERFLOW 2 WTIP (MISCELLANEOUS) ×1
IRRIGATION SUCT STRKRFLW 2 WTP (MISCELLANEOUS) ×1 IMPLANT
KIT BASIN OR (CUSTOM PROCEDURE TRAY) ×1 IMPLANT
KIT TURNOVER KIT A (KITS) IMPLANT
MANIPULATOR UTERINE 4.5 ZUMI (MISCELLANEOUS) IMPLANT
MARKER SKIN DUAL TIP RULER LAB (MISCELLANEOUS) ×1 IMPLANT
MESH Y UPSYLON VAGINAL (Mesh General) IMPLANT
OCCLUDER COLPOPNEUMO (BALLOONS) IMPLANT
PACKING VAGINAL (PACKING) IMPLANT
PAD OB MATERNITY 4.3X12.25 (PERSONAL CARE ITEMS) IMPLANT
PAD POSITIONING PINK XL (MISCELLANEOUS) ×1 IMPLANT
SCISSORS MNPLR CVD DVNC XI (INSTRUMENTS) ×1 IMPLANT
SEAL UNIV 5-12 XI (MISCELLANEOUS) ×4 IMPLANT
SET IRRIG Y TYPE TUR BLADDER L (SET/KITS/TRAYS/PACK) IMPLANT
SET TUBE SMOKE EVAC HIGH FLOW (TUBING) ×1 IMPLANT
SHEET LAVH (DRAPES) ×1 IMPLANT
SOL ELECTROSURG ANTI STICK (MISCELLANEOUS) ×1
SOL PREP POV-IOD 4OZ 10% (MISCELLANEOUS) IMPLANT
SOLUTION ELECTROSURG ANTI STCK (MISCELLANEOUS) ×1 IMPLANT
SURGILUBE 2OZ TUBE FLIPTOP (MISCELLANEOUS) IMPLANT
SUT MNCRL AB 4-0 PS2 18 (SUTURE) ×2 IMPLANT
SUT MON AB 3-0 SH 27 (SUTURE)
SUT MON AB 3-0 SH27 (SUTURE) IMPLANT
SUT PROLENE 2 0 CT 1 (SUTURE) ×1 IMPLANT
SUT VIC AB 0 CT1 27 (SUTURE) ×1
SUT VIC AB 0 CT1 27XBRD ANTBC (SUTURE) ×1 IMPLANT
SUT VIC AB 2-0 SH 27 (SUTURE) ×7
SUT VIC AB 2-0 SH 27XBRD (SUTURE) ×5 IMPLANT
SUT VIC AB 3-0 SH 27 (SUTURE) ×1
SUT VIC AB 3-0 SH 27X BRD (SUTURE) ×1 IMPLANT
SUT VICRYL 0 UR6 27IN ABS (SUTURE) ×1 IMPLANT
SUT VLOC 180 3-0 9IN GS21 (SUTURE) IMPLANT
SYR 50ML LL SCALE MARK (SYRINGE) IMPLANT
SYS BAG RETRIEVAL 10MM (BASKET)
SYSTEM BAG RETRIEVAL 10MM (BASKET) IMPLANT
TOWEL OR 17X26 10 PK STRL BLUE (TOWEL DISPOSABLE) ×1 IMPLANT
TRAY LAPAROSCOPIC (CUSTOM PROCEDURE TRAY) ×1 IMPLANT
TROCAR Z THREAD OPTICAL 12X100 (TROCAR) ×1 IMPLANT
TROCAR Z-THREAD FIOS 5X100MM (TROCAR) ×1 IMPLANT
WATER STERILE IRR 1000ML POUR (IV SOLUTION) ×1 IMPLANT

## 2023-03-26 NOTE — Interval H&P Note (Signed)
History and Physical Interval Note:  03/26/2023 7:18 AM  Lydia Campbell  has presented today for surgery, with the diagnosis of PELVIC ORGAN PROLAPSE.  The various methods of treatment have been discussed with the patient and family. After consideration of risks, benefits and other options for treatment, the patient has consented to  Procedure(s): XI ROBOTIC ASSISTED LAPAROSCOPIC SACROCOLPOPEXY (N/A) as a surgical intervention.  The patient's history has been reviewed, patient examined, no change in status, stable for surgery.  I have reviewed the patient's chart and labs.  Questions were answered to the patient's satisfaction.     Crist Fat

## 2023-03-26 NOTE — Plan of Care (Signed)
  Problem: Activity: Goal: Risk for activity intolerance will decrease Outcome: Progressing   Problem: Nutrition: Goal: Adequate nutrition will be maintained Outcome: Progressing   Problem: Pain Managment: Goal: General experience of comfort will improve Outcome: Progressing   

## 2023-03-26 NOTE — Progress Notes (Signed)
Patient arrived to floor. Unable to obtain oral & axillary temperature. Rectal temp obtained-94.85F. MD notified-placed warm blankets and heat packs. Temperature now reads 98.60F orally.

## 2023-03-26 NOTE — Anesthesia Procedure Notes (Signed)
Procedure Name: Intubation Date/Time: 03/26/2023 7:43 AM  Performed by: Ahmed Prima, CRNAPre-anesthesia Checklist: Patient identified, Emergency Drugs available, Suction available and Patient being monitored Patient Re-evaluated:Patient Re-evaluated prior to induction Oxygen Delivery Method: Circle system utilized Preoxygenation: Pre-oxygenation with 100% oxygen Induction Type: IV induction Ventilation: Mask ventilation without difficulty and Oral airway inserted - appropriate to patient size Laryngoscope Size: 2 and Miller Grade View: Grade II Tube type: Oral Number of attempts: 1 Airway Equipment and Method: Stylet and Oral airway Placement Confirmation: ETT inserted through vocal cords under direct vision, positive ETCO2 and breath sounds checked- equal and bilateral Secured at: 22 cm Tube secured with: Tape Dental Injury: Teeth and Oropharynx as per pre-operative assessment

## 2023-03-26 NOTE — Op Note (Signed)
Preoperative diagnosis:  Pelvic organ prolapse   Postoperative diagnosis:  same   Procedure: Robotic assisted laparoscopic sacrocolpopexy  Surgeon: Crist Fat, MD Resident Surgeon: Cathren Harsh, MD   Anesthesia: General  Complications: None  Intraoperative findings: [employer] scientific Upsilon Y-Mesh placed, cut to specific vaginal length  EBL: 50cc  Specimens: None  Indication: Lydia Campbell is a 82 y.o. female patient with symptomatic pelvic organ prolapse.  After reviewing the management options for treatment, he elected to proceed with the above surgical procedure(s). We have discussed the potential benefits and risks of the procedure, side effects of the proposed treatment, the likelihood of the patient achieving the goals of the procedure, and any potential problems that might occur during the procedure or recuperation. Informed consent has been obtained.  Description of procedure:  A Foley catheter was then placed and placed to gravity drainage. I then made a periumbilical incision carrying the dissection down to the patient's fascia with electrocautery.  Once to the fascia, the fascia was incised the peritoneum opened.  A 8mm port was then placed into the abdomen.  The abdomen was insufflated and the remaining ports placed under digital guidance.  2 ports were placed lateral to the umbilicus on the right proximally 10 cm apart.  The most lateral port was approximately 3 cm above the anterior iliac spine.  2 additional ports were placed in the patient's right side in comparable positions to the most lateral port on the right was a 12 mm port.the robot was then docked at an angle from the leg obliquely along the side of the left leg.  We then began our surgery by cleaning up some of the pelvic adhesions to the small bowel and colon.  Once this was completed I started dissecting at the sacral promontory located 3 cm medial to the location where the ureter crosses over the  iliac vessels at the pelvic brim. The posterior peritoneum was incised and the sacral prominence cleared off an area taking care to avoid the middle sacral vessels and the iliac branches.  I then created a posterior peritoneal tunnel starting at the sacral promontory and tunneling down the right pelvic sidewall down into the pelvis breaking back through the posterior peritoneum around the vesico-vaginal junction posteriorly.  I then continued the posterior dissection retracting down on the rectum and finding the avascular plane between the posterior vaginal wall and the rectum.  I carried this dissection down as far as I could to along the area of the perineal body.  I then turned my attention to the anterior plane between the anterior vaginal wall and the bladder.  I was able to obtain access to the avascular plane and with a combination of both monopolar cautery and blunt dissection was able to clean and nice down to the bladder neck.  I then turned my attention back to the patient's uterus and skeletonized the right uterine artery and vein and then took this with a series of bipolar moves.  I then performed a similar uterus pedicle ligation on the left.    Mesh was measured at approximately 9 cm anteriorly and 10.5 cm posteriorly and I cut this on the back table.  The mesh was then placed into the patient's abdomen through the assistant port and the anterior leaf was secured down onto the anterior vaginal wall with the apex at the bladder neck.  The posterior leaf was then secured down on the posterior vaginal wall.  These were sewn down with 2-0  Vicryl.  Between 6 and 8 were done on each side.  At this point I then went back to the previously dissected sacral promontory and posterior peritoneal tunnel and inserted a instrument through the tunnel and grasped the end of the mesh at the vaginal cuff and pull it up to the sacrum.  I then checked to ensure that the sacral mesh was not too tight by performing a  vaginal exam.  I then secured the sacral leg of the mesh using a 0 Prolene.  I then reapproximated the posterior peritoneum with a 2-0 Vicryl in a running fashion around the sacral promontory.  The pelvic peritoneum was closed using a pursestring.   The fascia of the 12 mm port was then closed with 0 Vicryl in a figure-of-eight fashion.  The skin was closed with 4-0 Monocryl's.  Dermabond was applied to the incision and exparel injected into the incisions.  Clindamycin impregnated packing was then placed into her vagina which will be left in overnight.  The patient was subsequently extubated and returned to the PACU in excellent condition.   Crist Fat, M.D.

## 2023-03-26 NOTE — Discharge Instructions (Signed)

## 2023-03-26 NOTE — H&P (Signed)
Pelvic organ prolapse   82 year old female presents today for further discussion of management for her pelvic organ prolapse. This has been present for a long time. It has gotten progressively worse. She has pain when her prolapse is severe. It can be reduced. She often has to reduce it in order to void. She does have a pessary that she uses. Without the pessary her flow is significantly reduced. When she has constipation she has significant trouble voiding as well. She has some mild stress incontinence, not of any real significance. She does not have a history of recurrent urinary tract infections.   The patient had a hysterectomy and tubal ligation many years ago.   The patient has severe arthritis within the knees limiting her mobility. She also has obesity. However, she is able to complete all her ADLs, and takes care of [employer].   The patient also has a history of CHF, takes 20 mg of Lasix for lower extremity edema. She has no history of heart attack or stroke.   The patient had urodynamics and was noted to have a large capacity bladder with overall low voiding pressure. She had mild incontinence with the prolapse reduced she is able to void, she had a fairly large residual.   On pelvic exam the patient was noted to have a grade 3 cystocele with a very small posterior defect.     ALLERGIES: Pencillin    MEDICATIONS: Albuterol Sulfate Hfa 90 mcg hfa aerosol with adapter  Estradiol 0.01 % cream with applicator 1 PO Daily  Fluticasone Propionate 50 mcg/actuation spray, suspension  Gabapentin 100 mg capsule  Ibu  Montelukast Sodium 10 mg tablet  Nabumetone 500 mg tablet  Pantoprazole Sodium 20 mg tablet, delayed release  Potassium Chloride 20 meq tablet, extended release  Sodium Bicarbonate  Systane 0.3 %-0.4 % drops  Trazodone Hcl 50 mg tablet  Vitamin C  Xyzal 5 mg tablet     GU PSH: Complex cystometrogram, w/ void pressure and urethral pressure profile studies, any  technique - 11/25/2022 Complex Uroflow - 11/25/2022 Cystoscopy - 01/15/2023 Emg surf Electrd - 11/25/2022 Hysterectomy - 1986 Inject For cystogram - 11/25/2022 Intrabd voidng Press - 11/25/2022     NON-GU PSH: Breast mastectomy, Left Shoulder Surgery (Unspecified) - 2022 Tonsillectomy - 1950 Visit Complexity (formerly GPC1X) - 01/15/2023     GU PMH: Cystocele, midline - 01/15/2023, - 11/04/2022 Mixed incontinence - 01/15/2023, - 11/25/2022, - 11/04/2022 Nocturia - 11/04/2022 Urinary Frequency - 11/04/2022 Weak Urinary Stream - 11/04/2022      PMH Notes: blood clot in lung/PE   NON-GU PMH: Breast Cancer, History GERD Gout    FAMILY HISTORY: Diabetes - Father High Blood Pressure - Father Strokes - Father   SOCIAL HISTORY: No Social History    REVIEW OF SYSTEMS:    GU Review Female:   Patient denies frequent urination, hard to postpone urination, burning /pain with urination, get up at night to urinate, leakage of urine, stream starts and stops, trouble starting your stream, have to strain to urinate, and being pregnant.  Gastrointestinal (Upper):   Patient denies nausea, vomiting, and indigestion/ heartburn.  Gastrointestinal (Lower):   Patient denies diarrhea and constipation.  Constitutional:   Patient denies fever, night sweats, weight loss, and fatigue.  Skin:   Patient denies skin rash/ lesion and itching.  Eyes:   Patient denies blurred vision and double vision.  Ears/ Nose/ Throat:   Patient denies sore throat and sinus problems.  Hematologic/Lymphatic:   Patient denies  swollen glands and easy bruising.  Cardiovascular:   Patient denies leg swelling and chest pains.  Respiratory:   Patient denies cough and shortness of breath.  Endocrine:   Patient denies excessive thirst.  Musculoskeletal:   Patient denies back pain and joint pain.  Neurological:   Patient denies headaches and dizziness.  Psychologic:   Patient denies depression and anxiety.   VITAL SIGNS: None   Complexity of  Data:  Source Of History:  Patient  Records Review:   Previous Doctor Records, Previous Patient Records, POC Tool  Urine Test Review:   Urinalysis  Urodynamics Review:   Review Bladder Scan, Review Urodynamics Tests   PROCEDURES:          Visit Complexity - G2211          Urinalysis w/Scope Dipstick Dipstick Cont'd Micro  Color: Yellow Bilirubin: Neg mg/dL WBC/hpf: 6 - 82/NFA  Appearance: Slightly Cloudy Ketones: Neg mg/dL RBC/hpf: NS (Not Seen)  Specific Gravity: 1.010 Blood: Neg ery/uL Bacteria: Many (>50/hpf)  pH: <=5.0 Protein: Neg mg/dL Cystals: NS (Not Seen)  Glucose: Neg mg/dL Urobilinogen: 0.2 mg/dL Casts: NS (Not Seen)    Nitrites: Positive Trichomonas: Not Present    Leukocyte Esterase: 1+ leu/uL Mucous: Not Present      Epithelial Cells: 0 - 5/hpf      Yeast: NS (Not Seen)      Sperm: Not Present    ASSESSMENT:      ICD-10 Details  1 GU:   Cystocele, midline - N81.11   2   Mixed incontinence - N39.46      PLAN:           Document Letter(s):  Created for Patient: Clinical Summary         Notes:   I went over robotic-assisted laparoscopic sacral colpopexy with the patient detail. I explained to the patient the rationale for the surgery. I also went over the placement of the laparoscopic ports. I detailed to her the surgery as well as the postoperative recovery time. I explained to the patient that she could expect to be in the hospital at least one or 2 nights. She will require 4 weeks of no heavy lifting, 6 weeks of no bending or twisting. She will not be able to use her vagina for 6 weeks. I discussed complications of the operation including injury to bowel, ureters, bladder. We also discussed the risk of failure as well as the complications of mesh. I explained to them the difference between transvaginal mesh and the mesh used for sacral colpopexy. I reassured them that there has not been an FDA warnings in regards to sacral colpopexy mesh. We will plan to get this  prior to her surgery. I spent 45 minutes with the patient going over that ins and outs of the surgery and answering all her questions.   The patient and I also discussed the probability of her having incontinence afterwards. Given that she has a fairly weak bladder as well as a large postvoid residual, I recommended against mid urethral sling concomitantly. If she needs it and subsequent follow-up we can discuss that.   She will need cardiac clearance. Otherwise we will get her scheduled ASAP.

## 2023-03-26 NOTE — Transfer of Care (Signed)
Immediate Anesthesia Transfer of Care Note  Patient: Lydia Campbell  Procedure(s) Performed: XI ROBOTIC ASSISTED LAPAROSCOPIC SACROCOLPOPEXY  Patient Location: PACU  Anesthesia Type:General  Level of Consciousness: drowsy  Airway & Oxygen Therapy: Patient Spontanous Breathing and Patient connected to face mask oxygen  Post-op Assessment: Report given to RN and Post -op Vital signs reviewed and stable  Post vital signs: Reviewed and stable  Last Vitals:  Vitals Value Taken Time  BP 139/84   Temp    Pulse 86   RR 12   SpO2 98%     Last Pain:  Vitals:   03/26/23 0636  TempSrc:   PainSc: 7          Complications: No notable events documented.

## 2023-03-26 NOTE — Anesthesia Postprocedure Evaluation (Signed)
Anesthesia Post Note  Patient: Lydia Campbell  Procedure(s) Performed: XI ROBOTIC ASSISTED LAPAROSCOPIC SACROCOLPOPEXY     Patient location during evaluation: PACU Anesthesia Type: General Level of consciousness: awake and alert Pain management: pain level controlled Vital Signs Assessment: post-procedure vital signs reviewed and stable Respiratory status: spontaneous breathing, nonlabored ventilation, respiratory function stable and patient connected to nasal cannula oxygen Cardiovascular status: blood pressure returned to baseline and stable Postop Assessment: no apparent nausea or vomiting Anesthetic complications: no   No notable events documented.  Last Vitals:  Vitals:   03/26/23 1309 03/26/23 1400  BP: (!) 152/70   Pulse: 66   Resp: 18   Temp: (!) 34.8 C 36.8 C  SpO2: 100%     Last Pain:  Vitals:   03/26/23 1400  TempSrc: Oral  PainSc:                  Trevor Iha

## 2023-03-27 DIAGNOSIS — N8189 Other female genital prolapse: Secondary | ICD-10-CM | POA: Diagnosis not present

## 2023-03-27 LAB — BASIC METABOLIC PANEL
Anion gap: 9 (ref 5–15)
BUN: 14 mg/dL (ref 8–23)
CO2: 24 mmol/L (ref 22–32)
Calcium: 7.9 mg/dL — ABNORMAL LOW (ref 8.9–10.3)
Chloride: 98 mmol/L (ref 98–111)
Creatinine, Ser: 0.95 mg/dL (ref 0.44–1.00)
GFR, Estimated: >60 mL/min (ref >60)
Glucose, Bld: 110 mg/dL — ABNORMAL HIGH (ref 70–99)
Potassium: 3.3 mmol/L — ABNORMAL LOW (ref 3.5–5.1)
Sodium: 131 mmol/L — ABNORMAL LOW (ref 135–145)

## 2023-03-27 LAB — HEMOGLOBIN AND HEMATOCRIT, BLOOD
HCT: 32.3 % — ABNORMAL LOW (ref 36.0–46.0)
Hemoglobin: 11 g/dL — ABNORMAL LOW (ref 12.0–15.0)

## 2023-03-27 NOTE — Progress Notes (Signed)
Pulled out foley catheter and vaginal packing.

## 2023-03-27 NOTE — Discharge Summary (Signed)
Date of admission: 03/26/2023  Date of discharge: 03/27/2023  Admission diagnosis: pelvic organ prolapse  Discharge diagnosis: same  Secondary diagnoses:  Patient Active Problem List   Diagnosis Date Noted   Prolapse of female pelvic organs 03/26/2023   Arthritis of right glenohumeral joint 11/22/2019   Chest pain with moderate risk for cardiac etiology 11/10/2016   Systolic murmur 10/11/2014   DOE (dyspnea on exertion) 10/11/2014   Urinary tract infection due to Proteus currently on day 3 of Cipro 07/26/2012   Subcutaneous hematoma 07/25/2012   Blood loss anemia 12/22/2011   Hypokalemia 12/22/2011   GERD (gastroesophageal reflux disease)    Arthritis    Sleep apnea    Hypertension    Hx of blood clots    Fibromyalgia    Anxiety    Right knee DJD    Left knee DJD    Breast cancer (HCC) 06/09/2011   MITRAL REGURGITATION 05/13/2009    Procedures performed: Procedure(s): XI ROBOTIC ASSISTED LAPAROSCOPIC SACROCOLPOPEXY  History and Physical: For full details, please see admission history and physical. Briefly, Lydia Campbell is a 82 y.o. year old patient with symptomatic pelvic organ prolapse. She underwent robotic sacrocolpopexy on 03/26/23.   Hospital Course: Patient tolerated the procedure well.  She was then transferred to the floor after an uneventful PACU stay.  Her hospital course was uncomplicated.  On POD#1 she had met discharge criteria: was eating a regular diet, was up and ambulating independently,  pain was well controlled, was voiding without a catheter, and was ready to for discharge. Her vaginal packing was removed prior to discharge.   Laboratory values:  Recent Labs    03/24/23 0922 03/27/23 0758  WBC 8.4  --   HGB 14.2 11.0*  HCT 43.0 32.3*   Recent Labs    03/24/23 0922 03/27/23 0416  NA 137 131*  K 4.3 3.3*  CL 105 98  CO2 24 24  GLUCOSE 97 110*  BUN 26* 14  CREATININE 0.87 0.95  CALCIUM 9.4 7.9*   No results for input(s): "LABPT", "INR"  in the last 72 hours. No results for input(s): "LABURIN" in the last 72 hours. Results for orders placed or performed during the hospital encounter of 03/24/23  Urine Culture     Status: Abnormal   Collection Time: 03/24/23  9:22 AM   Specimen: Urine, Clean Catch  Result Value Ref Range Status   Specimen Description   Final    URINE, CLEAN CATCH Performed at [facility], [address], [place], [place] [address]    Special Requests   Final    NONE Performed at [facility], [address] [place], [place] [address]    Culture >=100,000 COLONIES/mL ESCHERICHIA COLI (A)  Final   Report Status 03/26/2023 FINAL  Final   Organism ID, Bacteria ESCHERICHIA COLI (A)  Final      Susceptibility   Escherichia coli - MIC*    AMPICILLIN 8 SENSITIVE Sensitive     CEFAZOLIN <=4 SENSITIVE Sensitive     CEFEPIME <=0.12 SENSITIVE Sensitive     CEFTRIAXONE <=0.25 SENSITIVE Sensitive     CIPROFLOXACIN <=0.25 SENSITIVE Sensitive     GENTAMICIN <=1 SENSITIVE Sensitive     IMIPENEM <=0.25 SENSITIVE Sensitive     NITROFURANTOIN <=16 SENSITIVE Sensitive     TRIMETH/SULFA <=20 SENSITIVE Sensitive     AMPICILLIN/SULBACTAM 4 SENSITIVE Sensitive     PIP/TAZO <=4 SENSITIVE Sensitive     * >=100,000 COLONIES/mL ESCHERICHIA COLI  Physical Exam  Gen: NAD Resp: Satting well on RA Card: Regular rate Abd: Soft, appropriately tender, ND, incisions clean dry and intact GU: Voing spontaneously  Neuro: Alert   Disposition: Home  Discharge instruction: The patient was instructed to be ambulatory but told to refrain from heavy lifting, strenuous activity, or driving.   Discharge medications:  Allergies as of 03/27/2023       Reactions   Clarithromycin Itching   Morphine And Codeine Nausea Only      Penicillins Nausea And Vomiting, Other (See Comments)   seizures & convulsions; passed out; last time was when pt was 20  Has patient had a PCN reaction causing  immediate rash, facial/tongue/throat swelling, SOB or lightheadedness with hypotension: Yes Has patient had a PCN reaction causing severe rash involving mucus membranes or skin necrosis: No Has patient had a PCN reaction that required hospitalization: No Has patient had a PCN reaction occurring within the last 10 years: No If all of the above answers are "NO", then m   Tape Other (See Comments)   PLEASE USE COBAN WRAP; TAPE WILL TEAR OFF THE PATENT'S SKIN!!   Doxycycline Nausea Only, Nausea And Vomiting   Stomach plain        Medication List     TAKE these medications    acyclovir 400 MG tablet Commonly known as: ZOVIRAX Take 400 mg by mouth 3 (three) times daily as needed (fever blisters).   albuterol 108 (90 Base) MCG/ACT inhaler Commonly known as: VENTOLIN HFA Inhale 2 puffs into the lungs daily as needed for wheezing or shortness of breath.   allopurinol 300 MG tablet Commonly known as: ZYLOPRIM Take 300 mg by mouth daily.   ascorbic acid 500 MG tablet Commonly known as: VITAMIN C Take 500 mg by mouth daily.   Biotin 5000 MCG Caps Take 5,000 mcg by mouth daily.   Cholecalciferol 125 MCG (5000 UT) Tabs Take 5,000 Units by mouth daily.   Cranberry 500 MG Tabs Take 500 mg by mouth daily.   dicyclomine 10 MG capsule Commonly known as: BENTYL Take 10 mg by mouth 4 (four) times daily as needed for spasms.   fluticasone 50 MCG/ACT nasal spray Commonly known as: FLONASE Place 1 spray into both nostrils daily as needed for allergies.   furosemide 20 MG tablet Commonly known as: LASIX Take 1 tablet (20 mg total) by mouth as needed for edema.   gabapentin 100 MG capsule Commonly known as: NEURONTIN Take 100-200 mg by mouth See admin instructions. Take 200 mg in the morning and 100 mg @ 1500   guaiFENesin 600 MG 12 hr tablet Commonly known as: MUCINEX Take 600 mg by mouth daily.   ibuprofen 200 MG tablet Commonly known as: ADVIL Take 400 mg by mouth every  morning.   montelukast 10 MG tablet Commonly known as: SINGULAIR Take 10 mg by mouth daily.   nabumetone 500 MG tablet Commonly known as: RELAFEN Take 500 mg by mouth daily.   pantoprazole 20 MG tablet Commonly known as: PROTONIX Take 20 mg by mouth 2 (two) times daily.   potassium chloride SA 20 MEQ tablet Commonly known as: KLOR-CON M TAKE 1 TABLET BY MOUTH DAILY What changed: when to take this   sulfamethoxazole-trimethoprim 800-160 MG tablet Commonly known as: BACTRIM DS Take 1 tablet by mouth 2 (two) times daily for 7 days. Start taking one day prior to your appointment for your first follow-up and catheter removal.  Continue taking for three days.  Systane Ultra 0.4-0.3 % Soln Generic drug: Polyethyl Glycol-Propyl Glycol Place 1 drop into both eyes every morning.   traMADol 50 MG tablet Commonly known as: Ultram Take 1-2 tablets (50-100 mg total) by mouth every 6 (six) hours as needed for moderate pain.   traZODone 50 MG tablet Commonly known as: DESYREL Take 50 mg by mouth at bedtime.   triamterene-hydrochlorothiazide 75-50 MG tablet Commonly known as: MAXZIDE Take 1 tablet by mouth 2 (two) times daily.   UNABLE TO FIND Rx: L8000- Post Surgical Bras (Quantity: 6) Dx: 174.9; Left Mastectomy   Xyzal Allergy 24HR 5 MG tablet Generic drug: levocetirizine Take 5 mg by mouth every morning.        Followup:   Follow-up Information     Vanessa Barbara, NP Follow up on 04/09/2023.   Why: 1:15pm Contact information: [address] [place] [address] [phone]

## 2023-03-27 NOTE — Plan of Care (Signed)
  Problem: Education: Goal: Knowledge of General Education information will improve Description: Including pain rating scale, medication(s)/side effects and non-pharmacologic comfort measures Outcome: Progressing   Problem: Clinical Measurements: Goal: Ability to maintain clinical measurements within normal limits will improve Outcome: Progressing Goal: Cardiovascular complication will be avoided Outcome: Progressing   Problem: Nutrition: Goal: Adequate nutrition will be maintained Outcome: Progressing   Problem: Safety: Goal: Ability to remain free from injury will improve Outcome: Progressing

## 2023-03-29 ENCOUNTER — Encounter (HOSPITAL_COMMUNITY): Payer: Self-pay | Admitting: Urology

## 2023-04-16 ENCOUNTER — Ambulatory Visit
Admission: RE | Admit: 2023-04-16 | Discharge: 2023-04-16 | Disposition: A | Discharge status: Home / Self Care | Payer: Medicare Other | Source: Ambulatory Visit | Attending: Internal Medicine | Admitting: Internal Medicine

## 2023-04-16 ENCOUNTER — Other Ambulatory Visit: Payer: Self-pay | Admitting: Internal Medicine

## 2023-04-16 DIAGNOSIS — R928 Other abnormal and inconclusive findings on diagnostic imaging of breast: Secondary | ICD-10-CM

## 2023-04-16 DIAGNOSIS — R921 Mammographic calcification found on diagnostic imaging of breast: Secondary | ICD-10-CM

## 2023-05-05 ENCOUNTER — Other Ambulatory Visit: Payer: Self-pay | Admitting: Internal Medicine

## 2023-05-05 DIAGNOSIS — R921 Mammographic calcification found on diagnostic imaging of breast: Secondary | ICD-10-CM

## 2023-05-10 ENCOUNTER — Encounter: Payer: Self-pay | Admitting: Internal Medicine

## 2023-09-06 ENCOUNTER — Ambulatory Visit: Payer: Medicare Other | Admitting: Cardiovascular Disease

## 2023-10-15 ENCOUNTER — Other Ambulatory Visit: Payer: Self-pay | Admitting: Internal Medicine

## 2023-10-15 ENCOUNTER — Ambulatory Visit
Admission: RE | Admit: 2023-10-15 | Discharge: 2023-10-15 | Disposition: A | Discharge status: Home / Self Care | Payer: Medicare Other | Source: Ambulatory Visit | Attending: Internal Medicine | Admitting: Internal Medicine

## 2023-10-15 DIAGNOSIS — R921 Mammographic calcification found on diagnostic imaging of breast: Secondary | ICD-10-CM

## 2023-10-18 ENCOUNTER — Encounter: Payer: Self-pay | Admitting: Internal Medicine

## 2023-11-16 ENCOUNTER — Other Ambulatory Visit: Payer: Self-pay | Admitting: Cardiovascular Disease

## 2023-11-16 DIAGNOSIS — I2510 Atherosclerotic heart disease of native coronary artery without angina pectoris: Secondary | ICD-10-CM

## 2023-11-16 DIAGNOSIS — I10 Essential (primary) hypertension: Secondary | ICD-10-CM

## 2023-12-13 NOTE — Progress Notes (Unsigned)
 Cardiology Office Note:    Date:  12/27/2023   ID:  Lydia Campbell, DOB 08/25/1940, MRN [id number]  PCP:  Ethelene Herald, MD   [facility] Providers Cardiologist:  New to me / Ardell Beauvais   Referring MD: Ethelene Herald, MD    History of Present Illness:    Lydia Campbell is a 83 y.o. female with a hx of nonobstructive CAD by cath in 2016, PE in 2012 previously on warfarin, obesity, breast CA s/p chemo, anxiety, asthma, fibromyalgia, GERD, hiatal hernia, OSA on CPAP, osteoarthritis who presents to clinic for follow-up. Was previously followed by Dr. Nicholette Barley and Ardell Beauvais   Per review of the record, the patient has a remote history of  chest discomfort with abnormal stress test. Cardiac cath 10/26/14 showed 20% OM and minor luminal irregularities of the RCA with EF 60%. 2D Echo was done as well with EF 55-60%, no RWMA, trivial MR/PR. Her chest pain was previously felt to be related to an asthma exacerbation or anxiety. She also has chronic mild lower extremity edema. Event monitor in 11/2016 was normal.  Today, she is doing well. She continues to swell. She tried compression socks but is uncomfortable in them. Compression tights, however, do improve the swelling. She tried Lasix  for a month but it caused her to feel dizzy. Currently, she is taking 1/2 a tablet of Maxzide  daily, which is controlling her symptoms.   She endorses an episode of syncope summer 2022 while working on the yard. She had started Lasix  2 days prior. She did try in Lasix  again in the Winter but continued to feel dizzy. She has not had a syncopal episode since stopping the medication. Otherwise, she denies chest pressure, dyspnea at rest or with exertion, palpitations, PND, or orthopnea. She stays active but has some mild DOE which is stable for her.  Chronic lymphedema since teens in LEls mild lymphedema in LUE from mastectomy  Had robotic assisted laparoscopic sacrocolpopexy with Dr Dulcy Gibney September 2024 with  no cardiac issues   Confused about how much K she needs K recently 4.1 on labs  Son living with her now He is retired Consulting civil engineer and spent 3 years in [place]. Husband is 31    Past Medical History:  Diagnosis Date   Anxiety    Asthma    Blood transfusion 07/2008   "related to chemo" (07/25/2012)   Breast cancer (HCC)    breast lft   Chronic edema    Chronic lower back pain    Complication of anesthesia    Hard to wake up   DDD (degenerative disc disease), lumbosacral    Dyspnea    Fibromyalgia    GERD (gastroesophageal reflux disease)    Gout    H/O hiatal hernia    Heart murmur    Hx of blood clots 02/2011   "sudden chest pain; found multiple blood clots in both lungs; they think it was from Tamoxifen; haven't had any since 11/2011" (07/25/2012)   Hypertension    pt denies   Left knee DJD    Lymph edema    Left arm after mastectomy   Mild CAD    pt denies   OSA on CPAP    Moderate   Osteoarthritis    "pretty much everywhere" (07/25/2012)   Pulmonary embolism (HCC) 02/2011   after chemo   Right knee DJD    Seasonal allergies    Seizures (HCC)    "seizures vs convulsions related to PCN when I  was a child" (07/25/2012)   Subcutaneous hematoma 07/25/2012   Intraoperative cultures are pending on this fluid collection that was just outside the left knee joint.  Gram stain had few WBC monos and polys.   Varicose veins of both legs with edema     Past Surgical History:  Procedure Laterality Date   BREAST BIOPSY Left 03/05/2008   malignant   BREAST LUMPECTOMY  02/2008   "left" (07/25/2012)   BREAST SURGERY     CATARACT EXTRACTION, BILATERAL     KNEE ARTHROSCOPY  2001;  2009   "right; left" (07/25/2012)   LEFT HEART CATHETERIZATION WITH CORONARY ANGIOGRAM N/A 10/26/2014   Procedure: LEFT HEART CATHETERIZATION WITH CORONARY ANGIOGRAM;  Surgeon: Odie Benne, MD;  Location: [facility] CATH LAB;  Service: Cardiovascular;  Laterality: N/A;   MASTECTOMY MODIFIED RADICAL / SIMPLE / COMPLETE  Left 03/28/2008   "left" (07/25/2012)   PORTACATH PLACEMENT  03/28/2008; ?05/2008   "left; right" (07/25/2012)   REVERSE SHOULDER ARTHROPLASTY Right 11/22/2019   Procedure: REVERSE SHOULDER ARTHROPLASTY;  Surgeon: Micheline Ahr, MD;  Location: [facility] ORS;  Service: Orthopedics;  Laterality: Right;   ROBOTIC ASSISTED LAPAROSCOPIC SACROCOLPOPEXY N/A 03/26/2023   Procedure: XI ROBOTIC ASSISTED LAPAROSCOPIC SACROCOLPOPEXY;  Surgeon: Andrez Banker, MD;  Location: [facility] ORS;  Service: Urology;  Laterality: N/A;   TONSILLECTOMY  1950   TOTAL KNEE ARTHROPLASTY  12/21/2011   Procedure: TOTAL KNEE ARTHROPLASTY;  Surgeon: Genevie Kerns, MD;  Location: [facility] OR;  Service: Orthopedics;  Laterality: Right;  DR Jinger Mount WANTS 90 MINUTES FOR THIS CASE   TOTAL KNEE ARTHROPLASTY WITH REVISION COMPONENTS  07/25/2012   Procedure: TOTAL KNEE ARTHROPLASTY WITH REVISION COMPONENTS;  Surgeon: Genevie Kerns, MD;  Location: [facility] OR;  Service: Orthopedics;  Laterality: Left;   TUBAL LIGATION  ~ 1976   VAGINAL HYSTERECTOMY  ~ 1983   "endometrosis" (07/25/2012)    Current Medications: Current Meds  Medication Sig   acyclovir (ZOVIRAX) 400 MG tablet Take 400 mg by mouth 3 (three) times daily as needed (fever blisters).    albuterol  (VENTOLIN  HFA) 108 (90 Base) MCG/ACT inhaler Inhale 2 puffs into the lungs daily as needed for wheezing or shortness of breath.   allopurinol  (ZYLOPRIM ) 300 MG tablet Take 300 mg by mouth daily.    ascorbic acid  (VITAMIN C ) 500 MG tablet Take 500 mg by mouth daily.   Biotin 5000 MCG CAPS Take 5,000 mcg by mouth daily.   Cholecalciferol  125 MCG (5000 UT) TABS Take 5,000 Units by mouth daily.   dicyclomine  (BENTYL ) 10 MG capsule Take 10 mg by mouth 4 (four) times daily as needed for spasms.   fluticasone  (FLONASE ) 50 MCG/ACT nasal spray Place 1 spray into both nostrils daily as needed for allergies.   furosemide  (LASIX ) 20 MG tablet Take 1 tablet (20 mg total) by mouth as needed for edema.   gabapentin   (NEURONTIN ) 300 MG capsule Take 300 mg by mouth daily.   guaiFENesin  (MUCINEX ) 600 MG 12 hr tablet Take 600 mg by mouth daily.   ibuprofen (ADVIL) 200 MG tablet Take 400 mg by mouth every morning.   levocetirizine (XYZAL ALLERGY 24HR) 5 MG tablet Take 5 mg by mouth every morning.   montelukast  (SINGULAIR ) 10 MG tablet Take 10 mg by mouth daily.   nabumetone  (RELAFEN ) 500 MG tablet Take 500 mg by mouth daily.   pantoprazole  (PROTONIX ) 20 MG tablet Take 20 mg by mouth 2 (two) times daily.   Polyethyl Glycol-Propyl Glycol (SYSTANE ULTRA) 0.4-0.3 %  SOLN Place 1 drop into both eyes every morning.   potassium chloride  SA (KLOR-CON  M) 20 MEQ tablet Take 1 tablet (20 mEq total) by mouth daily. Please keep upcoming appointment with Dr. Finnis Colee on 6/9 in order to receive additional refills. Thank You.   traMADol  (ULTRAM ) 50 MG tablet Take 1-2 tablets (50-100 mg total) by mouth every 6 (six) hours as needed for moderate pain.   traZODone  (DESYREL ) 50 MG tablet Take 50 mg by mouth at bedtime.   triamterene -hydrochlorothiazide  (MAXZIDE ) 75-50 MG tablet Take 1 tablet by mouth 2 (two) times daily.   UNABLE TO FIND Rx: L8000- Post Surgical Bras (Quantity: 6) Dx: 174.9; Left Mastectomy     Allergies:   Clarithromycin, Morphine and codeine, Penicillins, Tape, and Doxycycline   Social History   Socioeconomic History   Marital status: Married    Spouse name: Not on file   Number of children: 1   Years of education: Not on file   Highest education level: Not on file  Occupational History   Occupation: retired  Tobacco Use   Smoking status: Former    Current packs/day: 0.00    Average packs/day: 1.5 packs/day for 5.0 years (7.5 ttl pk-yrs)    Types: Cigarettes    Start date: 12/07/1963    Quit date: 12/06/1968    Years since quitting: 55.0    Passive exposure: Past   Smokeless tobacco: Never  Vaping Use   Vaping status: Never Used  Substance and Sexual Activity   Alcohol  use: No    Comment: 07/25/2012  "bottle of champaign on our anniversary q yr; add a drink once in a blue moon for a celebration"   Drug use: No   Sexual activity: Yes    Partners: Male    Birth control/protection: Surgical  Other Topics Concern   Not on file  Social History Narrative   Not on file   Social Drivers of Health   Financial Resource Strain: Not on file  Food Insecurity: No Food Insecurity (03/26/2023)   Hunger Vital Sign    Worried About Running Out of Food in the Last Year: Never true    Ran Out of Food in the Last Year: Never true  Transportation Needs: No Transportation Needs (03/26/2023)   PRAPARE - Administrator, Civil Service (Medical): No    Lack of Transportation (Non-Medical): No  Physical Activity: Not on file  Stress: Not on file  Social Connections: Not on file     Family History: The patient's family history includes Breast cancer in her cousin; CVA in her father; Colon polyps in her sister; Diabetes in her father; Heart disease in her brother, father, mother, and sister. There is no history of Colon cancer.  ROS:   Please see the history of present illness.    Review of Systems  Constitutional:  Negative for fever and malaise/fatigue.  HENT:  Negative for congestion and sinus pain.   Eyes:  Negative for blurred vision and double vision.  Respiratory:  Positive for shortness of breath (exertional). Negative for cough.   Cardiovascular:  Positive for leg swelling (bilateral LE). Negative for chest pain, palpitations, orthopnea, claudication and PND.  Gastrointestinal:  Negative for heartburn and nausea.  Genitourinary:  Negative for dysuria and urgency.  Musculoskeletal:  Negative for joint pain and myalgias.  Skin:  Negative for itching and rash.  Neurological:  Negative for dizziness and headaches.  Endo/Heme/Allergies:  Does not bruise/bleed easily.  Psychiatric/Behavioral:  The patient is not  nervous/anxious and does not have insomnia.    All other systems reviewed  and are negative.  EKGs/Labs/Other Studies Reviewed:    The following studies were reviewed today: Event monitor 11/18/2016 Normal   Myoview  11/11/2016 No ischemia, EF 63; low risk   Cardiac catheterization 10/26/2014 OM 20 RCA minor irregularities EF 60   Myoview  10/23/2014 EF 65, inferior ischemia; intermediate risk   Echocardiogram 10/22/2014 EF 55-60, normal wall motion, trivial MR, trivial PI  EKG:   10/22/21: Sinus rhythm, rate 78 bpm  Recent Labs: 03/24/2023: Platelets 199 03/27/2023: BUN 14; Creatinine, Ser 0.95; Hemoglobin 11.0; Potassium 3.3; Sodium 131  Recent Lipid Panel    Component Value Date/Time   CHOL 175 11/11/2016 0244   TRIG 116 11/11/2016 0244   HDL 42 11/11/2016 0244   CHOLHDL 4.2 11/11/2016 0244   VLDL 23 11/11/2016 0244   LDLCALC 110 (H) 11/11/2016 0244     Physical Exam:    VS:  BP 116/60 (BP Location: Right Arm, Patient Position: Sitting, Cuff Size: Normal)   Pulse 71   Ht 5\' 7"  (1.702 m)   Wt 200 lb (90.7 kg)   LMP  (LMP Unknown)   SpO2 96%   BMI 31.32 kg/m     Wt Readings from Last 3 Encounters:  12/27/23 200 lb (90.7 kg)  03/26/23 197 lb (89.4 kg)  03/24/23 197 lb (89.4 kg)    Affect appropriate Healthy:  appears stated age HEENT: normal Neck supple with no adenopathy JVP normal no bruits no thyromegaly Lungs clear with no wheezing and good diaphragmatic motion Heart:  S1/S2 no murmur, no rub, gallop or click PMI normal Prior left mastectomy Abdomen: benighn, BS positve, no tenderness, no AAA no bruit.  No HSM or HJR Distal pulses intact with no bruits Chronic plus 2-3 lymphedema in ankles    Neuro non-focal Skin warm and dry No muscular weakness   PLAN:    In order of problems listed above:  #Mild CAD: Mild disease on cath in 2016. No anginal symptoms.  -Continue lifestyle modifications  #History of PE: Thought to be provoked in the setting of tamoxifen use. Was on warfarin now transitioned to ASA. -Continue ASA 81mg   daily  #HTN: Well controlled and at goal. -Change triamterene -HCTZ to 37.5-25mg  daily as patient is taking this already at home  #Chronic LE edema: Stable. -Triamterene -HCTZ to 37.5-25mg  daily  -Lasix  PRN 20 mg for worsening  -Continue K supplementation;  -Continue compression therapy - Update Echo has not been done since 2016 - TSH, lytes and Hct normal 11/03/22   #History of Breast Cancer: S/p chemotherapy. And left mastectomy In remission.     Echo for edema  F/U in a year   Signed, Janelle Mediate, MD  12/27/2023 9:43 AM    [facility]

## 2023-12-24 ENCOUNTER — Ambulatory Visit: Payer: Medicare Other | Admitting: Cardiovascular Disease

## 2023-12-27 ENCOUNTER — Ambulatory Visit: Payer: Medicare Other | Attending: Internal Medicine | Admitting: Cardiovascular Disease

## 2023-12-27 ENCOUNTER — Encounter: Payer: Self-pay | Admitting: Cardiovascular Disease

## 2023-12-27 ENCOUNTER — Other Ambulatory Visit: Payer: Self-pay

## 2023-12-27 VITALS — BP 116/60 | HR 71 | Ht 67.0 in | Wt 200.0 lb

## 2023-12-27 DIAGNOSIS — R601 Generalized edema: Secondary | ICD-10-CM

## 2023-12-27 DIAGNOSIS — I2510 Atherosclerotic heart disease of native coronary artery without angina pectoris: Secondary | ICD-10-CM

## 2023-12-27 DIAGNOSIS — I10 Essential (primary) hypertension: Secondary | ICD-10-CM

## 2023-12-27 MED ORDER — POTASSIUM CHLORIDE CRYS ER 20 MEQ PO TBCR: 20.0000 meq | EXTENDED_RELEASE_TABLET | Freq: Two times a day (BID) | ORAL | 3 refills | Status: AC

## 2023-12-27 NOTE — Patient Instructions (Signed)
 Medication Instructions:  Your physician has recommended you make the following change in your medication:   - INCREASE Potassium 20 mg by mouth twice daily.  *If you need a refill on your cardiac medications before your next appointment, please call your pharmacy*  Lab Work: Your physician recommends that you return for lab work in 1 week for BMET If you have labs (blood work) drawn today and your tests are completely normal, you will receive your results only by: MyChart Message (if you have MyChart) OR A paper copy in the mail If you have any lab test that is abnormal or we need to change your treatment, we will call you to review the results.  Testing/Procedures: Your physician has requested that you have an echocardiogram. Echocardiography is a painless test that uses sound waves to create images of your heart. It provides your doctor with information about the size and shape of your heart and how well your heart's chambers and valves are working. This procedure takes approximately one hour. There are no restrictions for this procedure. Please do NOT wear cologne, perfume, aftershave, or lotions (deodorant is allowed). Please arrive 15 minutes prior to your appointment time.  Please note: We ask at that you not bring children with you during ultrasound (echo/ vascular) testing. Due to room size and safety concerns, children are not allowed in the ultrasound rooms during exams. Our front office staff cannot provide observation of children in our lobby area while testing is being conducted. An adult accompanying a patient to their appointment will only be allowed in the ultrasound room at the discretion of the ultrasound technician under special circumstances. We apologize for any inconvenience.  Follow-Up: At [facility], you and your health needs are our priority.  As part of our continuing mission to provide you with exceptional heart care, our providers are all part of one  team.  This team includes your primary Cardiologist (physician) and Advanced Practice Providers or APPs (Physician Assistants and Nurse Practitioners) who all work together to provide you with the care you need, when you need it.  Your next appointment:   1 year(s)  Provider:   Janelle Mediate, MD    We recommend signing up for the patient portal called "MyChart".  Sign up information is provided on this After Visit Summary.  MyChart is used to connect with patients for Virtual Visits (Telemedicine).  Patients are able to view lab/test results, encounter notes, upcoming appointments, etc.  Non-urgent messages can be sent to your provider as well.   To learn more about what you can do with MyChart, go to [email or web address].

## 2024-01-05 ENCOUNTER — Ambulatory Visit: Payer: Self-pay | Admitting: Cardiovascular Disease

## 2024-01-05 LAB — BASIC METABOLIC PANEL WITH GFR
BUN/Creatinine Ratio: 27 (ref 12–28)
BUN: 25 mg/dL (ref 8–27)
CO2: 20 mmol/L (ref 20–29)
Calcium: 9.5 mg/dL (ref 8.7–10.3)
Chloride: 103 mmol/L (ref 96–106)
Creatinine, Ser: 0.91 mg/dL (ref 0.57–1.00)
Glucose: 56 mg/dL — ABNORMAL LOW (ref 70–99)
Potassium: 4.4 mmol/L (ref 3.5–5.2)
Sodium: 141 mmol/L (ref 134–144)
eGFR: 63 mL/min/{1.73_m2} (ref >59)

## 2024-01-07 ENCOUNTER — Other Ambulatory Visit: Payer: Self-pay | Admitting: Cardiovascular Disease

## 2024-02-10 ENCOUNTER — Ambulatory Visit (HOSPITAL_COMMUNITY)

## 2024-03-15 ENCOUNTER — Ambulatory Visit (HOSPITAL_COMMUNITY)
Admission: RE | Admit: 2024-03-15 | Discharge: 2024-03-15 | Disposition: A | Discharge status: Home / Self Care | Source: Ambulatory Visit | Attending: Cardiology | Admitting: Cardiology

## 2024-03-15 DIAGNOSIS — R600 Localized edema: Secondary | ICD-10-CM

## 2024-03-15 DIAGNOSIS — R601 Generalized edema: Secondary | ICD-10-CM | POA: Insufficient documentation

## 2024-03-15 LAB — ECHOCARDIOGRAM COMPLETE
Area-P 1/2: 3.99 cm2
S' Lateral: 3.2 cm

## 2024-04-17 ENCOUNTER — Encounter

## 2024-04-18 ENCOUNTER — Ambulatory Visit
Admission: RE | Admit: 2024-04-18 | Discharge: 2024-04-18 | Disposition: A | Source: Ambulatory Visit | Attending: Internal Medicine | Admitting: Internal Medicine

## 2024-04-18 DIAGNOSIS — R921 Mammographic calcification found on diagnostic imaging of breast: Secondary | ICD-10-CM
